# Patient Record
Sex: Female | Born: 1950 | Race: Asian | Hispanic: No | Marital: Married | Smoking: Never smoker
Health system: Southern US, Community
[De-identification: ages and names within clinical notes are randomized; demographics above are authoritative.]

## PROBLEM LIST (undated history)

## (undated) DIAGNOSIS — I10 Essential (primary) hypertension: Secondary | ICD-10-CM

## (undated) DIAGNOSIS — M069 Rheumatoid arthritis, unspecified: Secondary | ICD-10-CM

## (undated) HISTORY — DX: Rheumatoid arthritis, unspecified: M06.9

## (undated) HISTORY — DX: Essential (primary) hypertension: I10

## (undated) HISTORY — PX: KNEE SURGERY: SHX244

---

## 2015-03-22 ENCOUNTER — Encounter: Payer: Self-pay | Admitting: Internal Medicine

## 2015-03-22 ENCOUNTER — Ambulatory Visit (INDEPENDENT_AMBULATORY_CARE_PROVIDER_SITE_OTHER): Payer: Medicare HMO | Admitting: Internal Medicine

## 2015-03-22 VITALS — BP 130/82 | HR 62 | Temp 98.3°F | Resp 12 | Ht 62.0 in | Wt 162.0 lb

## 2015-03-22 DIAGNOSIS — Z23 Encounter for immunization: Secondary | ICD-10-CM | POA: Diagnosis not present

## 2015-03-22 DIAGNOSIS — I10 Essential (primary) hypertension: Secondary | ICD-10-CM

## 2015-03-22 NOTE — Patient Instructions (Signed)
The blood pressure looks really good today.  Keep up the good work with the exercise!  We have given you the pneumonia shot today.  Exercising to Stay Healthy Exercising regularly is important. It has many health benefits, such as:  Improving your overall fitness, flexibility, and endurance.  Increasing your bone density.  Helping with weight control.  Decreasing your body fat.  Increasing your muscle strength.  Reducing stress and tension.  Improving your overall health. In order to become healthy and stay healthy, it is recommended that you do moderate-intensity and vigorous-intensity exercise. You can tell that you are exercising at a moderate intensity if you have a higher heart rate and faster breathing, but you are still able to hold a conversation. You can tell that you are exercising at a vigorous intensity if you are breathing much harder and faster and cannot hold a conversation while exercising. HOW OFTEN SHOULD I EXERCISE? Choose an activity that you enjoy and set realistic goals. Your health care provider can help you to make an activity plan that works for you. Exercise regularly as directed by your health care provider. This may include:   Doing resistance training twice each week, such as:  Push-ups.  Sit-ups.  Lifting weights.  Using resistance bands.  Doing a given intensity of exercise for a given amount of time. Choose from these options:  150 minutes of moderate-intensity exercise every week.  75 minutes of vigorous-intensity exercise every week.  A mix of moderate-intensity and vigorous-intensity exercise every week. Children, pregnant women, people who are out of shape, people who are overweight, and older adults may need to consult a health care provider for individual recommendations. If you have any sort of medical condition, be sure to consult your health care provider before starting a new exercise program.  WHAT ARE SOME EXERCISE IDEAS? Some  moderate-intensity exercise ideas include:   Walking at a rate of 1 mile in 15 minutes.  Biking.  Hiking.  Golfing.  Dancing. Some vigorous-intensity exercise ideas include:   Walking at a rate of at least 4.5 miles per hour.  Jogging or running at a rate of 5 miles per hour.  Biking at a rate of at least 10 miles per hour.  Lap swimming.  Roller-skating or in-line skating.  Cross-country skiing.  Vigorous competitive sports, such as football, basketball, and soccer.  Jumping rope.  Aerobic dancing. WHAT ARE SOME EVERYDAY ACTIVITIES THAT CAN HELP ME TO GET EXERCISE?  Yard work, such as:  Psychologist, educational.  Raking and bagging leaves.  Washing and waxing your car.  Pushing a stroller.  Shoveling snow.  Gardening.  Washing windows or floors. HOW CAN I BE MORE ACTIVE IN MY DAY-TO-DAY ACTIVITIES?  Use the stairs instead of the elevator.  Take a walk during your lunch break.  If you drive, park your car farther away from work or school.  If you take public transportation, get off one stop early and walk the rest of the way.  Make all of your phone calls while standing up and walking around.  Get up, stretch, and walk around every 30 minutes throughout the day. WHAT GUIDELINES SHOULD I FOLLOW WHILE EXERCISING?  Do not exercise so much that you hurt yourself, feel dizzy, or get very short of breath.  Consult your health care provider before starting a new exercise program.  Wear comfortable clothes and shoes with good support.  Drink plenty of water while you exercise to prevent dehydration or heat stroke.  Body water is lost during exercise and must be replaced.  Work out until you breathe faster and your heart beats faster.   This information is not intended to replace advice given to you by your health care provider. Make sure you discuss any questions you have with your health care provider.   Document Released: 03/31/2010 Document Revised:  03/19/2014 Document Reviewed: 07/30/2013 Elsevier Interactive Patient Education [employer].

## 2015-03-22 NOTE — Progress Notes (Signed)
   Subjective:    Patient ID: Raven Haynes, female    DOB: 1950/04/07, 65 y.o.   MRN: [id number]  HPI The patient is a 65 YO female coming in for high blood pressure. She recently moved from [place] and had high pressure there. Here she is exercising and still taking her medicine and it seems to be doing a lot better. Denies headaches, chest pains, SOB. No stomach pain or nausea or vomiting.   PMH, [facility], social history reviewed and updated.   Review of Systems  Constitutional: Negative for fever, activity change, appetite change, fatigue and unexpected weight change.  HENT: Negative.   Eyes: Negative.   Respiratory: Negative for cough, chest tightness, shortness of breath and wheezing.   Cardiovascular: Negative for chest pain, palpitations and leg swelling.  Gastrointestinal: Negative for nausea, abdominal pain, diarrhea, constipation and abdominal distention.  Musculoskeletal: Negative.   Skin: Negative.   Neurological: Negative.   Psychiatric/Behavioral: Negative.       Objective:   Physical Exam  Constitutional: She is oriented to person, place, and time. She appears well-developed and well-nourished.  HENT:  Head: Normocephalic and atraumatic.  Eyes: EOM are normal.  Neck: Normal range of motion.  Cardiovascular: Normal rate and regular rhythm.   Carotids without bruits  Pulmonary/Chest: Effort normal and breath sounds normal. No respiratory distress. She has no wheezes. She has no rales.  Abdominal: Soft. Bowel sounds are normal. She exhibits no distension. There is no tenderness. There is no rebound.  Musculoskeletal: She exhibits no edema.  Neurological: She is alert and oriented to person, place, and time. Coordination normal.  Skin: Skin is warm and dry.  Psychiatric: She has a normal mood and affect.   Filed Vitals:   03/22/15 1114  BP: 130/82  Pulse: 62  Temp: 98.3 F (36.8 C)  TempSrc: Oral  Resp: 12  Height: 5\' 2"  (1.575 m)  Weight: 162 lb (73.483 kg)      Assessment & Plan:  Prevnar 13 given at visit

## 2015-03-22 NOTE — Assessment & Plan Note (Signed)
BP at goal on HCTZ 25 mg daily. Recent BMP checked with her previous physician in [place], will get those records. Not known to be complicated. Check labs at next visit. No side effects from medication. Encouraged continued exercise.

## 2015-03-22 NOTE — Progress Notes (Signed)
Pre visit review using our clinic review tool, if applicable. No additional management support is needed unless otherwise documented below in the visit note. 

## 2015-04-01 ENCOUNTER — Telehealth: Payer: Self-pay | Admitting: Internal Medicine

## 2015-04-01 NOTE — Telephone Encounter (Signed)
Received records from Dr. Melany Guernsey [facility] forwarded 4 pages to Dr. Pricilla Holm 04/01/15 fbg

## 2015-05-02 ENCOUNTER — Other Ambulatory Visit: Payer: Self-pay | Admitting: Geriatric Medicine

## 2015-05-02 MED ORDER — HYDROCHLOROTHIAZIDE 25 MG PO TABS: 25.0000 mg | ORAL_TABLET | Freq: Every day | ORAL | Status: DC

## 2015-09-09 DIAGNOSIS — R2241 Localized swelling, mass and lump, right lower limb: Secondary | ICD-10-CM | POA: Diagnosis not present

## 2015-09-09 DIAGNOSIS — M25571 Pain in right ankle and joints of right foot: Secondary | ICD-10-CM | POA: Diagnosis not present

## 2015-09-09 DIAGNOSIS — M2241 Chondromalacia patellae, right knee: Secondary | ICD-10-CM | POA: Diagnosis not present

## 2015-09-16 DIAGNOSIS — M25571 Pain in right ankle and joints of right foot: Secondary | ICD-10-CM | POA: Diagnosis not present

## 2015-09-21 DIAGNOSIS — M67479 Ganglion, unspecified ankle and foot: Secondary | ICD-10-CM | POA: Diagnosis not present

## 2015-09-21 DIAGNOSIS — R2241 Localized swelling, mass and lump, right lower limb: Secondary | ICD-10-CM | POA: Diagnosis not present

## 2015-09-21 DIAGNOSIS — M25571 Pain in right ankle and joints of right foot: Secondary | ICD-10-CM | POA: Diagnosis not present

## 2015-10-11 DIAGNOSIS — G5751 Tarsal tunnel syndrome, right lower limb: Secondary | ICD-10-CM | POA: Diagnosis not present

## 2015-10-11 DIAGNOSIS — M67471 Ganglion, right ankle and foot: Secondary | ICD-10-CM | POA: Diagnosis not present

## 2015-10-11 DIAGNOSIS — I8311 Varicose veins of right lower extremity with inflammation: Secondary | ICD-10-CM | POA: Diagnosis not present

## 2015-11-15 ENCOUNTER — Other Ambulatory Visit (INDEPENDENT_AMBULATORY_CARE_PROVIDER_SITE_OTHER): Payer: Medicare HMO

## 2015-11-15 ENCOUNTER — Ambulatory Visit (INDEPENDENT_AMBULATORY_CARE_PROVIDER_SITE_OTHER): Payer: Medicare HMO | Admitting: Internal Medicine

## 2015-11-15 ENCOUNTER — Encounter: Payer: Self-pay | Admitting: Internal Medicine

## 2015-11-15 VITALS — BP 142/70 | HR 88 | Temp 98.4°F | Resp 12 | Ht 62.0 in | Wt 162.0 lb

## 2015-11-15 DIAGNOSIS — I10 Essential (primary) hypertension: Secondary | ICD-10-CM

## 2015-11-15 DIAGNOSIS — Z0000 Encounter for general adult medical examination without abnormal findings: Secondary | ICD-10-CM

## 2015-11-15 DIAGNOSIS — Z1159 Encounter for screening for other viral diseases: Secondary | ICD-10-CM

## 2015-11-15 DIAGNOSIS — Z1239 Encounter for other screening for malignant neoplasm of breast: Secondary | ICD-10-CM

## 2015-11-15 DIAGNOSIS — Z23 Encounter for immunization: Secondary | ICD-10-CM

## 2015-11-15 LAB — COMPREHENSIVE METABOLIC PANEL
ALT: 16 U/L (ref 0–35)
AST: 17 U/L (ref 0–37)
Albumin: 4.4 g/dL (ref 3.5–5.2)
Alkaline Phosphatase: 68 U/L (ref 39–117)
BILIRUBIN TOTAL: 0.4 mg/dL (ref 0.2–1.2)
BUN: 16 mg/dL (ref 6–23)
CO2: 29 meq/L (ref 19–32)
CREATININE: 0.85 mg/dL (ref 0.40–1.20)
Calcium: 9.2 mg/dL (ref 8.4–10.5)
Chloride: 102 mEq/L (ref 96–112)
GFR: 71.02 mL/min (ref >60.00)
GLUCOSE: 122 mg/dL — AB (ref 70–99)
Potassium: 3.7 mEq/L (ref 3.5–5.1)
Sodium: 138 mEq/L (ref 135–145)
Total Protein: 7.5 g/dL (ref 6.0–8.3)

## 2015-11-15 LAB — LIPID PANEL
CHOL/HDL RATIO: 4
Cholesterol: 178 mg/dL (ref 0–200)
HDL: 48.1 mg/dL (ref >39.00)
LDL Cholesterol: 106 mg/dL — ABNORMAL HIGH (ref 0–99)
NONHDL: 129.43
Triglycerides: 119 mg/dL (ref 0.0–149.0)
VLDL: 23.8 mg/dL (ref 0.0–40.0)

## 2015-11-15 MED ORDER — HYDROCHLOROTHIAZIDE 25 MG PO TABS: 25.0000 mg | ORAL_TABLET | Freq: Every day | ORAL | 3 refills | Status: DC

## 2015-11-15 NOTE — Assessment & Plan Note (Signed)
Checking labs and hep c screening. Given flu shot today. Counseled about sun safety and mole surveillance. Counseled about exercise and healthy diet. Given 10 year screening recommendations. Ordered mammogram. DEXA not due for another 1-2 years.

## 2015-11-15 NOTE — Progress Notes (Signed)
Pre visit review using our clinic review tool, if applicable. No additional management support is needed unless otherwise documented below in the visit note. 

## 2015-11-15 NOTE — Assessment & Plan Note (Signed)
BP at goal on her HCTZ 25 mg daily. Checking CMP and adjust as needed.

## 2015-11-15 NOTE — Patient Instructions (Addendum)
We have given you the flu shot today.   We have sent in the refills.   The breast center does mammograms: [address], [place], [place] [address], phone number [phone]  Keep up the great work with your health as you are in good shape!  Health Maintenance, Female Adopting a healthy lifestyle and getting preventive care can go a long way to promote health and wellness. Talk with your health care provider about what schedule of regular examinations is right for you. This is a good chance for you to check in with your provider about disease prevention and staying healthy. In between checkups, there are plenty of things you can do on your own. Experts have done a lot of research about which lifestyle changes and preventive measures are most likely to keep you healthy. Ask your health care provider for more information. WEIGHT AND DIET  Eat a healthy diet  Be sure to include plenty of vegetables, fruits, low-fat dairy products, and lean protein.  Do not eat a lot of foods high in solid fats, added sugars, or salt.  Get regular exercise. This is one of the most important things you can do for your health.  Most adults should exercise for at least 150 minutes each week. The exercise should increase your heart rate and make you sweat (moderate-intensity exercise).  Most adults should also do strengthening exercises at least twice a week. This is in addition to the moderate-intensity exercise.  Maintain a healthy weight  Body mass index (BMI) is a measurement that can be used to identify possible weight problems. It estimates body fat based on height and weight. Your health care provider can help determine your BMI and help you achieve or maintain a healthy weight.  For females 52 years of age and older:   A BMI below 18.5 is considered underweight.  A BMI of 18.5 to 24.9 is normal.  A BMI of 25 to 29.9 is considered overweight.  A BMI of 30 and above is considered obese.   Watch levels of cholesterol and blood lipids  You should start having your blood tested for lipids and cholesterol at 66 years of age, then have this test every 5 years.  You may need to have your cholesterol levels checked more often if:  Your lipid or cholesterol levels are high.  You are older than 66 years of age.  You are at high risk for heart disease.  CANCER SCREENING   Lung Cancer  Lung cancer screening is recommended for adults 49-54 years old who are at high risk for lung cancer because of a history of smoking.  A yearly low-dose CT scan of the lungs is recommended for people who:  Currently smoke.  Have quit within the past 15 years.  Have at least a 30-pack-year history of smoking. A pack year is smoking an average of one pack of cigarettes a day for 1 year.  Yearly screening should continue until it has been 15 years since you quit.  Yearly screening should stop if you develop a health problem that would prevent you from having lung cancer treatment.  Breast Cancer  Practice breast self-awareness. This means understanding how your breasts normally appear and feel.  It also means doing regular breast self-exams. Let your health care provider know about any changes, no matter how small.  If you are in your 20s or 30s, you should have a clinical breast exam (CBE) by a health care provider every 1-3  years as part of a regular health exam.  If you are 80 or older, have a CBE every year. Also consider having a breast X-ray (mammogram) every year.  If you have a family history of breast cancer, talk to your health care provider about genetic screening.  If you are at high risk for breast cancer, talk to your health care provider about having an MRI and a mammogram every year.  Breast cancer gene (BRCA) assessment is recommended for women who have family members with BRCA-related cancers. BRCA-related cancers  include:  Breast.  Ovarian.  Tubal.  Peritoneal cancers.  Results of the assessment will determine the need for genetic counseling and BRCA1 and BRCA2 testing. Cervical Cancer Your health care provider may recommend that you be screened regularly for cancer of the pelvic organs (ovaries, uterus, and vagina). This screening involves a pelvic examination, including checking for microscopic changes to the surface of your cervix (Pap test). You may be encouraged to have this screening done every 3 years, beginning at age 21.  For women ages 22-65, health care providers may recommend pelvic exams and Pap testing every 3 years, or they may recommend the Pap and pelvic exam, combined with testing for human papilloma virus (HPV), every 5 years. Some types of HPV increase your risk of cervical cancer. Testing for HPV may also be done on women of any age with unclear Pap test results.  Other health care providers may not recommend any screening for nonpregnant women who are considered low risk for pelvic cancer and who do not have symptoms. Ask your health care provider if a screening pelvic exam is right for you.  If you have had past treatment for cervical cancer or a condition that could lead to cancer, you need Pap tests and screening for cancer for at least 20 years after your treatment. If Pap tests have been discontinued, your risk factors (such as having a new sexual partner) need to be reassessed to determine if screening should resume. Some women have medical problems that increase the chance of getting cervical cancer. In these cases, your health care provider may recommend more frequent screening and Pap tests. Colorectal Cancer  This type of cancer can be detected and often prevented.  Routine colorectal cancer screening usually begins at 66 years of age and continues through 66 years of age.  Your health care provider may recommend screening at an earlier age if you have risk factors for  colon cancer.  Your health care provider may also recommend using home test kits to check for hidden blood in the stool.  A small camera at the end of a tube can be used to examine your colon directly (sigmoidoscopy or colonoscopy). This is done to check for the earliest forms of colorectal cancer.  Routine screening usually begins at age 7.  Direct examination of the colon should be repeated every 5-10 years through 66 years of age. However, you may need to be screened more often if early forms of precancerous polyps or small growths are found. Skin Cancer  Check your skin from head to toe regularly.  Tell your health care provider about any new moles or changes in moles, especially if there is a change in a mole's shape or color.  Also tell your health care provider if you have a mole that is larger than the size of a pencil eraser.  Always use sunscreen. Apply sunscreen liberally and repeatedly throughout the day.  Protect yourself by wearing long  sleeves, pants, a wide-brimmed hat, and sunglasses whenever you are outside. HEART DISEASE, DIABETES, AND HIGH BLOOD PRESSURE   High blood pressure causes heart disease and increases the risk of stroke. High blood pressure is more likely to develop in:  People who have blood pressure in the high end of the normal range (130-139/85-89 mm Hg).  People who are overweight or obese.  People who are African American.  If you are 61-23 years of age, have your blood pressure checked every 3-5 years. If you are 72 years of age or older, have your blood pressure checked every year. You should have your blood pressure measured twice--once when you are at a hospital or clinic, and once when you are not at a hospital or clinic. Record the average of the two measurements. To check your blood pressure when you are not at a hospital or clinic, you can use:  An automated blood pressure machine at a pharmacy.  A home blood pressure monitor.  If you  are between 59 years and 56 years old, ask your health care provider if you should take aspirin to prevent strokes.  Have regular diabetes screenings. This involves taking a blood sample to check your fasting blood sugar level.  If you are at a normal weight and have a low risk for diabetes, have this test once every three years after 66 years of age.  If you are overweight and have a high risk for diabetes, consider being tested at a younger age or more often. PREVENTING INFECTION  Hepatitis B  If you have a higher risk for hepatitis B, you should be screened for this virus. You are considered at high risk for hepatitis B if:  You were born in a country where hepatitis B is common. Ask your health care provider which countries are considered high risk.  Your parents were born in a high-risk country, and you have not been immunized against hepatitis B (hepatitis B vaccine).  You have HIV or AIDS.  You use needles to inject street drugs.  You live with someone who has hepatitis B.  You have had sex with someone who has hepatitis B.  You get hemodialysis treatment.  You take certain medicines for conditions, including cancer, organ transplantation, and autoimmune conditions. Hepatitis C  Blood testing is recommended for:  Everyone born from [birth year] through 1966.  Anyone with known risk factors for hepatitis C. Sexually transmitted infections (STIs)  You should be screened for sexually transmitted infections (STIs) including gonorrhea and chlamydia if:  You are sexually active and are younger than 66 years of age.  You are older than 66 years of age and your health care provider tells you that you are at risk for this type of infection.  Your sexual activity has changed since you were last screened and you are at an increased risk for chlamydia or gonorrhea. Ask your health care provider if you are at risk.  If you do not have HIV, but are at risk, it may be recommended that you  take a prescription medicine daily to prevent HIV infection. This is called pre-exposure prophylaxis (PrEP). You are considered at risk if:  You are sexually active and do not regularly use condoms or know the HIV status of your partner(s).  You take drugs by injection.  You are sexually active with a partner who has HIV. Talk with your health care provider about whether you are at high risk of being infected with HIV. If you choose  to begin PrEP, you should first be tested for HIV. You should then be tested every 3 months for as long as you are taking PrEP.  PREGNANCY   If you are premenopausal and you may become pregnant, ask your health care provider about preconception counseling.  If you may become pregnant, take 400 to 800 micrograms (mcg) of folic acid every day.  If you want to prevent pregnancy, talk to your health care provider about birth control (contraception). OSTEOPOROSIS AND MENOPAUSE   Osteoporosis is a disease in which the bones lose minerals and strength with aging. This can result in serious bone fractures. Your risk for osteoporosis can be identified using a bone density scan.  If you are 23 years of age or older, or if you are at risk for osteoporosis and fractures, ask your health care provider if you should be screened.  Ask your health care provider whether you should take a calcium or vitamin D supplement to lower your risk for osteoporosis.  Menopause may have certain physical symptoms and risks.  Hormone replacement therapy may reduce some of these symptoms and risks. Talk to your health care provider about whether hormone replacement therapy is right for you.  HOME CARE INSTRUCTIONS   Schedule regular health, dental, and eye exams.  Stay current with your immunizations.   Do not use any tobacco products including cigarettes, chewing tobacco, or electronic cigarettes.  If you are pregnant, do not drink alcohol.  If you are breastfeeding, limit how  much and how often you drink alcohol.  Limit alcohol intake to no more than 1 drink per day for nonpregnant women. One drink equals 12 ounces of beer, 5 ounces of wine, or 1 ounces of hard liquor.  Do not use street drugs.  Do not share needles.  Ask your health care provider for help if you need support or information about quitting drugs.  Tell your health care provider if you often feel depressed.  Tell your health care provider if you have ever been abused or do not feel safe at home.   This information is not intended to replace advice given to you by your health care provider. Make sure you discuss any questions you have with your health care provider.   Document Released: 09/11/2010 Document Revised: 03/19/2014 Document Reviewed: 01/28/2013 Elsevier Interactive Patient Education [employer].

## 2015-11-15 NOTE — Progress Notes (Signed)
   Subjective:    Patient ID: Raven Haynes, female    DOB: [birth year], 65 y.o.   MRN: [id number]  HPI Here for medicare wellness and CPE, no new complaints. Please see A/P for status and treatment of chronic medical problems.   Diet: heart healthy Physical activity: active Depression/mood screen: negative Hearing: intact to whispered voice Visual acuity: grossly normal with contacts, performs annual eye exam  ADLs: capable Fall risk: none Home safety: good Cognitive evaluation: intact to orientation, naming, recall and repetition EOL planning: adv directives discussed  I have personally reviewed and have noted 1. The patient's medical and social history - reviewed today no changes 2. Their use of alcohol, tobacco or illicit drugs 3. Their current medications and supplements 4. The patient's functional ability including ADL's, fall risks, home safety risks and hearing or visual impairment. 5. Diet and physical activities 6. Evidence for depression or mood disorders 7. Care team reviewed and updated (available in snapshot)  Review of Systems  Constitutional: Negative for activity change, appetite change, fatigue, fever and unexpected weight change.  HENT: Negative.   Eyes: Negative.   Respiratory: Negative for cough, chest tightness, shortness of breath and wheezing.   Cardiovascular: Negative for chest pain, palpitations and leg swelling.  Gastrointestinal: Negative for abdominal distention, abdominal pain, constipation, diarrhea and nausea.  Musculoskeletal: Negative.   Skin: Negative.   Neurological: Negative.   Psychiatric/Behavioral: Negative.       Objective:   Physical Exam  Constitutional: She is oriented to person, place, and time. She appears well-developed and well-nourished.  HENT:  Head: Normocephalic and atraumatic.  Eyes: EOM are normal.  Neck: Normal range of motion.  Cardiovascular: Normal rate and regular rhythm.   Carotids without bruits    Pulmonary/Chest: Effort normal and breath sounds normal. No respiratory distress. She has no wheezes. She has no rales.  Abdominal: Soft. Bowel sounds are normal. She exhibits no distension. There is no tenderness. There is no rebound.  Musculoskeletal: She exhibits no edema.  Neurological: She is alert and oriented to person, place, and time. Coordination normal.  Skin: Skin is warm and dry.  Psychiatric: She has a normal mood and affect.   Vitals:   11/15/15 0859  BP: (!) 142/70  Pulse: 88  Resp: 12  Temp: 98.4 F (36.9 C)  TempSrc: Oral  SpO2: 97%  Weight: 162 lb (73.5 kg)  Height: 5\' 2"  (1.575 m)      Assessment & Plan:  Flu shot given at visit.

## 2015-11-16 LAB — HEPATITIS C ANTIBODY: HCV Ab: NEGATIVE

## 2015-12-21 ENCOUNTER — Ambulatory Visit
Admission: RE | Admit: 2015-12-21 | Discharge: 2015-12-21 | Disposition: A | Discharge status: Home / Self Care | Payer: Medicare HMO | Source: Ambulatory Visit | Attending: Internal Medicine | Admitting: Internal Medicine

## 2015-12-21 DIAGNOSIS — Z1231 Encounter for screening mammogram for malignant neoplasm of breast: Secondary | ICD-10-CM | POA: Diagnosis not present

## 2015-12-21 DIAGNOSIS — Z1239 Encounter for other screening for malignant neoplasm of breast: Secondary | ICD-10-CM

## 2016-11-15 ENCOUNTER — Encounter: Payer: Medicare HMO | Admitting: Internal Medicine

## 2016-11-19 ENCOUNTER — Telehealth: Payer: Self-pay | Admitting: Internal Medicine

## 2016-11-19 MED ORDER — HYDROCHLOROTHIAZIDE 25 MG PO TABS: 25.0000 mg | ORAL_TABLET | Freq: Every day | ORAL | 0 refills | Status: DC

## 2016-11-19 NOTE — Telephone Encounter (Signed)
Pt called requesting a refill on her hydrochlorothiazide (HYDRODIURIL) 25 MG tablet sent to [facility] in [place]. She said that her appointment was originally scheduled in September but was moved due to Dr Sharlet Salina being out of the office.

## 2016-11-19 NOTE — Telephone Encounter (Signed)
Per office policy sent 30 day to local pharmacy until appt.../lmb  

## 2016-12-10 ENCOUNTER — Ambulatory Visit (INDEPENDENT_AMBULATORY_CARE_PROVIDER_SITE_OTHER): Payer: Medicare HMO | Admitting: Internal Medicine

## 2016-12-10 ENCOUNTER — Encounter: Payer: Self-pay | Admitting: Internal Medicine

## 2016-12-10 ENCOUNTER — Other Ambulatory Visit (INDEPENDENT_AMBULATORY_CARE_PROVIDER_SITE_OTHER): Payer: Medicare HMO

## 2016-12-10 VITALS — BP 130/82 | HR 56 | Temp 97.8°F | Ht 62.0 in | Wt 160.0 lb

## 2016-12-10 DIAGNOSIS — Z23 Encounter for immunization: Secondary | ICD-10-CM

## 2016-12-10 DIAGNOSIS — I10 Essential (primary) hypertension: Secondary | ICD-10-CM | POA: Diagnosis not present

## 2016-12-10 DIAGNOSIS — Z0000 Encounter for general adult medical examination without abnormal findings: Secondary | ICD-10-CM

## 2016-12-10 LAB — COMPREHENSIVE METABOLIC PANEL
ALBUMIN: 4.3 g/dL (ref 3.5–5.2)
ALK PHOS: 66 U/L (ref 39–117)
ALT: 18 U/L (ref 0–35)
AST: 18 U/L (ref 0–37)
BUN: 15 mg/dL (ref 6–23)
CO2: 27 mEq/L (ref 19–32)
Calcium: 9.6 mg/dL (ref 8.4–10.5)
Chloride: 103 mEq/L (ref 96–112)
Creatinine, Ser: 0.66 mg/dL (ref 0.40–1.20)
GFR: 94.79 mL/min (ref >60.00)
Glucose, Bld: 125 mg/dL — ABNORMAL HIGH (ref 70–99)
POTASSIUM: 4.1 meq/L (ref 3.5–5.1)
Sodium: 140 mEq/L (ref 135–145)
TOTAL PROTEIN: 7.3 g/dL (ref 6.0–8.3)
Total Bilirubin: 0.5 mg/dL (ref 0.2–1.2)

## 2016-12-10 LAB — CBC
HCT: 40.9 % (ref 36.0–46.0)
HEMOGLOBIN: 13.6 g/dL (ref 12.0–15.0)
MCHC: 33.2 g/dL (ref 30.0–36.0)
MCV: 88.5 fl (ref 78.0–100.0)
PLATELETS: 276 10*3/uL (ref 150.0–400.0)
RBC: 4.62 Mil/uL (ref 3.87–5.11)
RDW: 13 % (ref 11.5–15.5)
WBC: 3.6 10*3/uL — AB (ref 4.0–10.5)

## 2016-12-10 LAB — LIPID PANEL
CHOLESTEROL: 192 mg/dL (ref 0–200)
HDL: 50.4 mg/dL (ref >39.00)
LDL CALC: 116 mg/dL — AB (ref 0–99)
NonHDL: 142.08
Total CHOL/HDL Ratio: 4
Triglycerides: 129 mg/dL (ref 0.0–149.0)
VLDL: 25.8 mg/dL (ref 0.0–40.0)

## 2016-12-10 LAB — HEMOGLOBIN A1C: HEMOGLOBIN A1C: 6.2 % (ref 4.6–6.5)

## 2016-12-10 MED ORDER — ZOSTER VAC RECOMB ADJUVANTED 50 MCG/0.5ML IM SUSR: 0.5000 mL | Freq: Once | INTRAMUSCULAR | 1 refills | Status: AC

## 2016-12-10 NOTE — Progress Notes (Signed)
   Subjective:    Patient ID: Raven Haynes, female    DOB: 01-Mar-1951, 66 y.o.   MRN: [id number]  HPI Here for medicare wellness and physical, no new complaints. Please see A/P for status and treatment of chronic medical problems.   Diet: heart healthy Physical activity: sedentary Depression/mood screen: negative Hearing: intact to whispered voice Visual acuity: grossly normal with contacts, performs annual eye exam  ADLs: capable Fall risk: none Home safety: good Cognitive evaluation: intact to orientation, naming, recall and repetition EOL planning: adv directives discussed  I have personally reviewed and have noted 1. The patient's medical and social history - reviewed today no changes 2. Their use of alcohol, tobacco or illicit drugs 3. Their current medications and supplements 4. The patient's functional ability including ADL's, fall risks, home safety risks and hearing or visual impairment. 5. Diet and physical activities 6. Evidence for depression or mood disorders 7. Care team reviewed and updated (available in snapshot)  Review of Systems  Constitutional: Negative.   HENT: Negative.   Eyes: Negative.   Respiratory: Negative for cough, chest tightness and shortness of breath.   Cardiovascular: Negative for chest pain, palpitations and leg swelling.  Gastrointestinal: Negative for abdominal distention, abdominal pain, constipation, diarrhea, nausea and vomiting.  Musculoskeletal: Negative.   Skin: Negative.   Neurological: Negative.   Psychiatric/Behavioral: Negative.       Objective:   Physical Exam  Constitutional: She is oriented to person, place, and time. She appears well-developed and well-nourished.  HENT:  Head: Normocephalic and atraumatic.  Eyes: EOM are normal.  Neck: Normal range of motion.  Cardiovascular: Normal rate and regular rhythm.   Carotids without bruit  Pulmonary/Chest: Effort normal and breath sounds normal. No respiratory distress. She  has no wheezes. She has no rales.  Abdominal: Soft. Bowel sounds are normal. She exhibits no distension. There is no tenderness. There is no rebound.  Musculoskeletal: She exhibits no edema.  Neurological: She is alert and oriented to person, place, and time. Coordination normal.  Skin: Skin is warm and dry.  Psychiatric: She has a normal mood and affect.   Vitals:   12/10/16 0800  BP: 130/82  Pulse: (!) 56  Temp: 97.8 F (36.6 C)  TempSrc: Oral  SpO2: 99%  Weight: 160 lb (72.6 kg)  Height: 5\' 2"  (1.575 m)      Assessment & Plan:  Flu shot given at visit

## 2016-12-10 NOTE — Patient Instructions (Addendum)
We will check the labs today and call you back with the results.   We have given you the prescription for the shingles vaccine to get at a pharmacy.   Health Maintenance, Female Adopting a healthy lifestyle and getting preventive care can go a long way to promote health and wellness. Talk with your health care provider about what schedule of regular examinations is right for you. This is a good chance for you to check in with your provider about disease prevention and staying healthy. In between checkups, there are plenty of things you can do on your own. Experts have done a lot of research about which lifestyle changes and preventive measures are most likely to keep you healthy. Ask your health care provider for more information. Weight and diet Eat a healthy diet  Be sure to include plenty of vegetables, fruits, low-fat dairy products, and lean protein.  Do not eat a lot of foods high in solid fats, added sugars, or salt.  Get regular exercise. This is one of the most important things you can do for your health. ? Most adults should exercise for at least 150 minutes each week. The exercise should increase your heart rate and make you sweat (moderate-intensity exercise). ? Most adults should also do strengthening exercises at least twice a week. This is in addition to the moderate-intensity exercise.  Maintain a healthy weight  Body mass index (BMI) is a measurement that can be used to identify possible weight problems. It estimates body fat based on height and weight. Your health care provider can help determine your BMI and help you achieve or maintain a healthy weight.  For females 21 years of age and older: ? A BMI below 18.5 is considered underweight. ? A BMI of 18.5 to 24.9 is normal. ? A BMI of 25 to 29.9 is considered overweight. ? A BMI of 30 and above is considered obese.  Watch levels of cholesterol and blood lipids  You should start having your blood tested for lipids and  cholesterol at 67 years of age, then have this test every 5 years.  You may need to have your cholesterol levels checked more often if: ? Your lipid or cholesterol levels are high. ? You are older than 67 years of age. ? You are at high risk for heart disease.  Cancer screening Lung Cancer  Lung cancer screening is recommended for adults 60-1 years old who are at high risk for lung cancer because of a history of smoking.  A yearly low-dose CT scan of the lungs is recommended for people who: ? Currently smoke. ? Have quit within the past 15 years. ? Have at least a 30-pack-year history of smoking. A pack year is smoking an average of one pack of cigarettes a day for 1 year.  Yearly screening should continue until it has been 15 years since you quit.  Yearly screening should stop if you develop a health problem that would prevent you from having lung cancer treatment.  Breast Cancer  Practice breast self-awareness. This means understanding how your breasts normally appear and feel.  It also means doing regular breast self-exams. Let your health care provider know about any changes, no matter how small.  If you are in your 20s or 30s, you should have a clinical breast exam (CBE) by a health care provider every 1-3 years as part of a regular health exam.  If you are 84 or older, have a CBE every year. Also consider having  a breast X-ray (mammogram) every year.  If you have a family history of breast cancer, talk to your health care provider about genetic screening.  If you are at high risk for breast cancer, talk to your health care provider about having an MRI and a mammogram every year.  Breast cancer gene (BRCA) assessment is recommended for women who have family members with BRCA-related cancers. BRCA-related cancers include: ? Breast. ? Ovarian. ? Tubal. ? Peritoneal cancers.  Results of the assessment will determine the need for genetic counseling and BRCA1 and BRCA2  testing.  Cervical Cancer Your health care provider may recommend that you be screened regularly for cancer of the pelvic organs (ovaries, uterus, and vagina). This screening involves a pelvic examination, including checking for microscopic changes to the surface of your cervix (Pap test). You may be encouraged to have this screening done every 3 years, beginning at age 64.  For women ages 77-65, health care providers may recommend pelvic exams and Pap testing every 3 years, or they may recommend the Pap and pelvic exam, combined with testing for human papilloma virus (HPV), every 5 years. Some types of HPV increase your risk of cervical cancer. Testing for HPV may also be done on women of any age with unclear Pap test results.  Other health care providers may not recommend any screening for nonpregnant women who are considered low risk for pelvic cancer and who do not have symptoms. Ask your health care provider if a screening pelvic exam is right for you.  If you have had past treatment for cervical cancer or a condition that could lead to cancer, you need Pap tests and screening for cancer for at least 20 years after your treatment. If Pap tests have been discontinued, your risk factors (such as having a new sexual partner) need to be reassessed to determine if screening should resume. Some women have medical problems that increase the chance of getting cervical cancer. In these cases, your health care provider may recommend more frequent screening and Pap tests.  Colorectal Cancer  This type of cancer can be detected and often prevented.  Routine colorectal cancer screening usually begins at 67 years of age and continues through 67 years of age.  Your health care provider may recommend screening at an earlier age if you have risk factors for colon cancer.  Your health care provider may also recommend using home test kits to check for hidden blood in the stool.  A small camera at the end of a  tube can be used to examine your colon directly (sigmoidoscopy or colonoscopy). This is done to check for the earliest forms of colorectal cancer.  Routine screening usually begins at age 75.  Direct examination of the colon should be repeated every 5-10 years through 67 years of age. However, you may need to be screened more often if early forms of precancerous polyps or small growths are found.  Skin Cancer  Check your skin from head to toe regularly.  Tell your health care provider about any new moles or changes in moles, especially if there is a change in a mole's shape or color.  Also tell your health care provider if you have a mole that is larger than the size of a pencil eraser.  Always use sunscreen. Apply sunscreen liberally and repeatedly throughout the day.  Protect yourself by wearing long sleeves, pants, a wide-brimmed hat, and sunglasses whenever you are outside.  Heart disease, diabetes, and high blood pressure  High blood pressure causes heart disease and increases the risk of stroke. High blood pressure is more likely to develop in: ? People who have blood pressure in the high end of the normal range (130-139/85-89 mm Hg). ? People who are overweight or obese. ? People who are African American.  If you are 97-43 years of age, have your blood pressure checked every 3-5 years. If you are 34 years of age or older, have your blood pressure checked every year. You should have your blood pressure measured twice-once when you are at a hospital or clinic, and once when you are not at a hospital or clinic. Record the average of the two measurements. To check your blood pressure when you are not at a hospital or clinic, you can use: ? An automated blood pressure machine at a pharmacy. ? A home blood pressure monitor.  If you are between 39 years and 85 years old, ask your health care provider if you should take aspirin to prevent strokes.  Have regular diabetes screenings. This  involves taking a blood sample to check your fasting blood sugar level. ? If you are at a normal weight and have a low risk for diabetes, have this test once every three years after 67 years of age. ? If you are overweight and have a high risk for diabetes, consider being tested at a younger age or more often. Preventing infection Hepatitis B  If you have a higher risk for hepatitis B, you should be screened for this virus. You are considered at high risk for hepatitis B if: ? You were born in a country where hepatitis B is common. Ask your health care provider which countries are considered high risk. ? Your parents were born in a high-risk country, and you have not been immunized against hepatitis B (hepatitis B vaccine). ? You have HIV or AIDS. ? You use needles to inject street drugs. ? You live with someone who has hepatitis B. ? You have had sex with someone who has hepatitis B. ? You get hemodialysis treatment. ? You take certain medicines for conditions, including cancer, organ transplantation, and autoimmune conditions.  Hepatitis C  Blood testing is recommended for: ? Everyone born from [birth year] through 1966. ? Anyone with known risk factors for hepatitis C.  Sexually transmitted infections (STIs)  You should be screened for sexually transmitted infections (STIs) including gonorrhea and chlamydia if: ? You are sexually active and are younger than 67 years of age. ? You are older than 67 years of age and your health care provider tells you that you are at risk for this type of infection. ? Your sexual activity has changed since you were last screened and you are at an increased risk for chlamydia or gonorrhea. Ask your health care provider if you are at risk.  If you do not have HIV, but are at risk, it may be recommended that you take a prescription medicine daily to prevent HIV infection. This is called pre-exposure prophylaxis (PrEP). You are considered at risk if: ? You are  sexually active and do not regularly use condoms or know the HIV status of your partner(s). ? You take drugs by injection. ? You are sexually active with a partner who has HIV.  Talk with your health care provider about whether you are at high risk of being infected with HIV. If you choose to begin PrEP, you should first be tested for HIV. You should then be tested every 3 months  for as long as you are taking PrEP. Pregnancy  If you are premenopausal and you may become pregnant, ask your health care provider about preconception counseling.  If you may become pregnant, take 400 to 800 micrograms (mcg) of folic acid every day.  If you want to prevent pregnancy, talk to your health care provider about birth control (contraception). Osteoporosis and menopause  Osteoporosis is a disease in which the bones lose minerals and strength with aging. This can result in serious bone fractures. Your risk for osteoporosis can be identified using a bone density scan.  If you are 29 years of age or older, or if you are at risk for osteoporosis and fractures, ask your health care provider if you should be screened.  Ask your health care provider whether you should take a calcium or vitamin D supplement to lower your risk for osteoporosis.  Menopause may have certain physical symptoms and risks.  Hormone replacement therapy may reduce some of these symptoms and risks. Talk to your health care provider about whether hormone replacement therapy is right for you. Follow these instructions at home:  Schedule regular health, dental, and eye exams.  Stay current with your immunizations.  Do not use any tobacco products including cigarettes, chewing tobacco, or electronic cigarettes.  If you are pregnant, do not drink alcohol.  If you are breastfeeding, limit how much and how often you drink alcohol.  Limit alcohol intake to no more than 1 drink per day for nonpregnant women. One drink equals 12 ounces of  beer, 5 ounces of wine, or 1 ounces of hard liquor.  Do not use street drugs.  Do not share needles.  Ask your health care provider for help if you need support or information about quitting drugs.  Tell your health care provider if you often feel depressed.  Tell your health care provider if you have ever been abused or do not feel safe at home. This information is not intended to replace advice given to you by your health care provider. Make sure you discuss any questions you have with your health care provider. Document Released: 09/11/2010 Document Revised: 08/04/2015 Document Reviewed: 11/30/2014 Elsevier Interactive Patient Education  [employer].

## 2016-12-10 NOTE — Assessment & Plan Note (Signed)
Flu shot given at visit, checking labs, rx for shingrix given at visit. Tdap and pneumonia up to date. Dexa up to date. Mammogram up to date. Colonoscopy up to date. Counseled about sun safety and mole surveillance. Counseled about need for regular exercise. Given 10 year screening recommendations.

## 2016-12-10 NOTE — Assessment & Plan Note (Signed)
Taking hctz 25 mg daily and BP at goal. Counseled on need for regular exercise. Checking CMP and adjust as needed.

## 2016-12-26 ENCOUNTER — Other Ambulatory Visit: Payer: Self-pay | Admitting: Internal Medicine

## 2016-12-28 ENCOUNTER — Telehealth: Payer: Self-pay | Admitting: Internal Medicine

## 2016-12-28 NOTE — Telephone Encounter (Signed)
Patient is requesting refills on hydrochlorothiazide.  Patient uses [facility].

## 2016-12-28 NOTE — Telephone Encounter (Signed)
Patient informed Rx was sent and is ready at her pharmacy

## 2017-07-24 DIAGNOSIS — Z833 Family history of diabetes mellitus: Secondary | ICD-10-CM | POA: Diagnosis not present

## 2017-07-24 DIAGNOSIS — I10 Essential (primary) hypertension: Secondary | ICD-10-CM | POA: Diagnosis not present

## 2017-10-18 DIAGNOSIS — H40013 Open angle with borderline findings, low risk, bilateral: Secondary | ICD-10-CM | POA: Diagnosis not present

## 2017-10-18 DIAGNOSIS — H43813 Vitreous degeneration, bilateral: Secondary | ICD-10-CM | POA: Diagnosis not present

## 2017-10-18 DIAGNOSIS — H2513 Age-related nuclear cataract, bilateral: Secondary | ICD-10-CM | POA: Diagnosis not present

## 2017-12-12 ENCOUNTER — Telehealth: Payer: Self-pay | Admitting: *Deleted

## 2017-12-12 NOTE — Progress Notes (Signed)
Subjective:   Raven Haynes is a 67 y.o. female who presents for Medicare Annual (Subsequent) preventive examination.  Review of Systems:  No ROS.  Medicare Wellness Visit. Additional risk factors are reflected in the social history.  Cardiac Risk Factors include: advanced age (>34men, >56 women);hypertension Sleep patterns: feels rested on waking, gets up 1 times nightly to void and sleeps 7-8 hours nightly.    Home Safety/Smoke Alarms: Feels safe in home. Smoke alarms in place.  Living environment; residence and Firearm Safety: 2-story house, no firearms. Lives with husband, no needs for DME, good support system Seat Belt Safety/Bike Helmet: Wears seat belt.      Objective:     Vitals: BP (!) 150/90   Pulse 71   Ht 5\' 2"  (1.575 m)   Wt 164 lb (74.4 kg)   SpO2 97%   BMI 30.00 kg/m   Body mass index is 30 kg/m.  Advanced Directives 12/13/2017  Does Patient Have a Medical Advance Directive? Yes  Type of Paramedic of [place];Living will  Copy of [place] in Chart? No - copy requested    Tobacco Social History   Tobacco Use  Smoking Status Never Smoker  Smokeless Tobacco Never Used     Counseling given: Not Answered  Past Medical History:  Diagnosis Date  . Hypertension    Past Surgical History:  Procedure Laterality Date  . KNEE SURGERY Bilateral 2004,2009   Family History  Problem Relation Age of Onset  . Diabetes Mother    Social History   Socioeconomic History  . Marital status: Married    Spouse name: Not on file  . Number of children: 2  . Years of education: Not on file  . Highest education level: Not on file  Occupational History  . Occupation: owns Radio broadcast assistant: [employer]  Social Needs  . Financial resource strain: Not hard at all  . Food insecurity:    Worry: Never true    Inability: Never true  . Transportation needs:    Medical: No    Non-medical: No    Tobacco Use  . Smoking status: Never Smoker  . Smokeless tobacco: Never Used  Substance and Sexual Activity  . Alcohol use: No    Alcohol/week: 0.0 standard drinks  . Drug use: No  . Sexual activity: Yes  Lifestyle  . Physical activity:    Days per week: 4 days    Minutes per session: 60 min  . Stress: Only a little  Relationships  . Social connections:    Talks on phone: More than three times a week    Gets together: More than three times a week    Attends religious service: More than 4 times per year    Active member of club or organization: Yes    Attends meetings of clubs or organizations: More than 4 times per year    Relationship status: Married  Other Topics Concern  . Not on file  Social History Narrative  . Not on file    Outpatient Encounter Medications as of 12/13/2017  Medication Sig  . B Complex-C (SUPER B COMPLEX PO) Take by mouth.  . Calcium Citrate-Vitamin D (CITRACAL + D PO) Take by mouth.  . Cholecalciferol (D3-1000 PO) Take by mouth.  . Cyanocobalamin (VITAMIN B 12 PO) Take by mouth.  . hydrochlorothiazide (HYDRODIURIL) 25 MG tablet Take 1 tablet (25 mg total) by mouth daily.  . Magnesium 400  MG CAPS Take by mouth.  . Red Yeast Rice Extract (RED YEAST RICE PO) Take by mouth.  . [DISCONTINUED] hydrochlorothiazide (HYDRODIURIL) 25 MG tablet TAKE ONE TABLET BY MOUTH ONE TIME DAILY    No facility-administered encounter medications on file as of 12/13/2017.     Activities of Daily Living In your present state of health, do you have any difficulty performing the following activities: 12/13/2017  Hearing? N  Vision? N  Difficulty concentrating or making decisions? N  Walking or climbing stairs? N  Dressing or bathing? N  Doing errands, shopping? N  Preparing Food and eating ? N  Using the Toilet? N  In the past six months, have you accidently leaked urine? N  Do you have problems with loss of bowel control? N  Managing your Medications? N  Managing  your Finances? N  Housekeeping or managing your Housekeeping? N  Some recent data might be hidden    Patient Care Team: Hoyt Koch, MD as PCP - General (Internal Medicine)    Assessment:   This is a routine wellness examination for Raven Haynes. Physical assessment deferred to PCP.   Exercise Activities and Dietary recommendations Current Exercise Habits: Home exercise routine, Type of exercise: stretching;calisthenics;strength training/weights, Time (Minutes): 60, Frequency (Times/Week): 4, Weekly Exercise (Minutes/Week): 240, Intensity: Mild, Exercise limited by: None identified  Diet (meal preparation, eat out, water intake, caffeinated beverages, dairy products, fruits and vegetables): in general, a "healthy" diet  , well balanced. eats a variety of fruits and vegetables daily, limits salt, fat/cholesterol, sugar,carbohydrates,caffeine, drinks 6-8 glasses of water daily.  Goals   None     Fall Risk Fall Risk  12/13/2017 12/10/2016 03/22/2015  Falls in the past year? No No No    Depression Screen PHQ 2/9 Scores 12/13/2017 12/13/2017 12/10/2016 03/22/2015  PHQ - 2 Score 0 0 0 0     Cognitive Function       Ad8 score reviewed for issues:  Issues making decisions: no  Less interest in hobbies / activities: no  Repeats questions, stories (family complaining): no  Trouble using ordinary gadgets (microwave, computer, phone):no  Forgets the month or year: no  Mismanaging finances: no  Remembering appts: no  Daily problems with thinking and/or memory: no Ad8 score is= 0  Immunization History  Administered Date(s) Administered  . Influenza, High Dose Seasonal PF 12/10/2016, 12/13/2017  . Influenza,inj,Quad PF,6+ Mos 11/15/2015  . Pneumococcal Conjugate-13 03/22/2015  . Pneumococcal Polysaccharide-23 03/10/2014  . Tdap 09/09/2011  . Zoster 12/24/2013  . Zoster Recombinat (Shingrix) 10/02/2017   Screening Tests Health Maintenance  Topic Date Due  . MAMMOGRAM   12/20/2017  . PNA vac Low Risk Adult (2 of 2 - PPSV23) 03/11/2019  . TETANUS/TDAP  09/08/2021  . COLONOSCOPY  07/10/2023  . INFLUENZA VACCINE  Completed  . Hepatitis C Screening  Completed  . DEXA SCAN  Addressed      Plan:      Continue doing brain stimulating activities (puzzles, reading, adult coloring books, staying active) to keep memory sharp.   Continue to eat heart healthy diet (full of fruits, vegetables, whole grains, lean protein, water--limit salt, fat, and sugar intake) and increase physical activity as tolerated.  I have personally reviewed and noted the following in the patient's chart:   . Medical and social history . Use of alcohol, tobacco or illicit drugs  . Current medications and supplements . Functional ability and status . Nutritional status . Physical activity . Advanced directives . List of  other physicians . Vitals . Screenings to include cognitive, depression, and falls . Referrals and appointments  In addition, I have reviewed and discussed with patient certain preventive protocols, quality metrics, and best practice recommendations. A written personalized care plan for preventive services as well as general preventive health recommendations were provided to patient.     Michiel Cowboy, RN  12/13/2017

## 2017-12-12 NOTE — Telephone Encounter (Signed)
LVM for patient to call back in regards to scheduling AWV with our health coach.  

## 2017-12-13 ENCOUNTER — Encounter: Payer: Self-pay | Admitting: Internal Medicine

## 2017-12-13 ENCOUNTER — Ambulatory Visit (INDEPENDENT_AMBULATORY_CARE_PROVIDER_SITE_OTHER): Payer: Medicare HMO | Admitting: Internal Medicine

## 2017-12-13 ENCOUNTER — Ambulatory Visit (INDEPENDENT_AMBULATORY_CARE_PROVIDER_SITE_OTHER): Payer: Medicare HMO | Admitting: *Deleted

## 2017-12-13 ENCOUNTER — Other Ambulatory Visit (INDEPENDENT_AMBULATORY_CARE_PROVIDER_SITE_OTHER): Payer: Medicare HMO

## 2017-12-13 VITALS — BP 150/90 | HR 71 | Ht 62.0 in | Wt 164.0 lb

## 2017-12-13 VITALS — BP 140/88 | HR 71 | Temp 98.0°F | Ht 62.0 in | Wt 164.0 lb

## 2017-12-13 DIAGNOSIS — Z0000 Encounter for general adult medical examination without abnormal findings: Secondary | ICD-10-CM | POA: Diagnosis not present

## 2017-12-13 DIAGNOSIS — Z23 Encounter for immunization: Secondary | ICD-10-CM

## 2017-12-13 DIAGNOSIS — I10 Essential (primary) hypertension: Secondary | ICD-10-CM

## 2017-12-13 DIAGNOSIS — Z1231 Encounter for screening mammogram for malignant neoplasm of breast: Secondary | ICD-10-CM | POA: Diagnosis not present

## 2017-12-13 DIAGNOSIS — E2839 Other primary ovarian failure: Secondary | ICD-10-CM

## 2017-12-13 LAB — CBC
HCT: 40.8 % (ref 36.0–46.0)
Hemoglobin: 13.8 g/dL (ref 12.0–15.0)
MCHC: 33.9 g/dL (ref 30.0–36.0)
MCV: 87.3 fl (ref 78.0–100.0)
PLATELETS: 252 10*3/uL (ref 150.0–400.0)
RBC: 4.67 Mil/uL (ref 3.87–5.11)
RDW: 13.1 % (ref 11.5–15.5)
WBC: 4 10*3/uL (ref 4.0–10.5)

## 2017-12-13 LAB — COMPREHENSIVE METABOLIC PANEL
ALK PHOS: 69 U/L (ref 39–117)
ALT: 20 U/L (ref 0–35)
AST: 28 U/L (ref 0–37)
Albumin: 4.6 g/dL (ref 3.5–5.2)
BUN: 14 mg/dL (ref 6–23)
CALCIUM: 9.8 mg/dL (ref 8.4–10.5)
CO2: 26 mEq/L (ref 19–32)
CREATININE: 0.8 mg/dL (ref 0.40–1.20)
Chloride: 103 mEq/L (ref 96–112)
GFR: 75.69 mL/min (ref >60.00)
Glucose, Bld: 124 mg/dL — ABNORMAL HIGH (ref 70–99)
POTASSIUM: 4.1 meq/L (ref 3.5–5.1)
SODIUM: 138 meq/L (ref 135–145)
TOTAL PROTEIN: 8.1 g/dL (ref 6.0–8.3)
Total Bilirubin: 0.8 mg/dL (ref 0.2–1.2)

## 2017-12-13 LAB — LIPID PANEL
CHOLESTEROL: 216 mg/dL — AB (ref 0–200)
HDL: 50.6 mg/dL (ref >39.00)
LDL Cholesterol: 140 mg/dL — ABNORMAL HIGH (ref 0–99)
NonHDL: 165.16
TRIGLYCERIDES: 126 mg/dL (ref 0.0–149.0)
Total CHOL/HDL Ratio: 4
VLDL: 25.2 mg/dL (ref 0.0–40.0)

## 2017-12-13 LAB — HEMOGLOBIN A1C: Hgb A1c MFr Bld: 6.1 % (ref 4.6–6.5)

## 2017-12-13 MED ORDER — HYDROCHLOROTHIAZIDE 25 MG PO TABS: 25.0000 mg | ORAL_TABLET | Freq: Every day | ORAL | 3 refills | Status: DC

## 2017-12-13 NOTE — Progress Notes (Signed)
Medical screening examination/treatment/procedure(s) were performed by non-physician practitioner and as supervising physician I was immediately available for consultation/collaboration. I agree with above. Elizabeth A Crawford, MD 

## 2017-12-13 NOTE — Progress Notes (Signed)
   Subjective:    Patient ID: Raven Haynes, female    DOB: 13-Sep-1950, 67 y.o.   MRN: [id number]  HPI The patient is a 67 YO female coming in for physical.   PMH, [facility], social history reviewed and updated.   Review of Systems  Constitutional: Negative.   HENT: Negative.   Eyes: Negative.   Respiratory: Negative for cough, chest tightness and shortness of breath.   Cardiovascular: Negative for chest pain, palpitations and leg swelling.  Gastrointestinal: Negative for abdominal distention, abdominal pain, constipation, diarrhea, nausea and vomiting.  Musculoskeletal: Negative.   Skin: Negative.   Neurological: Negative.   Psychiatric/Behavioral: Negative.       Objective:   Physical Exam  Constitutional: She is oriented to person, place, and time. She appears well-developed and well-nourished.  HENT:  Head: Normocephalic and atraumatic.  Eyes: EOM are normal.  Neck: Normal range of motion.  Cardiovascular: Normal rate and regular rhythm.  Pulmonary/Chest: Effort normal and breath sounds normal. No respiratory distress. She has no wheezes. She has no rales.  Abdominal: Soft. Bowel sounds are normal. She exhibits no distension. There is no tenderness. There is no rebound.  Musculoskeletal: She exhibits no edema.  Neurological: She is alert and oriented to person, place, and time. Coordination normal.  Skin: Skin is warm and dry.  Psychiatric: She has a normal mood and affect.   Vitals:   12/13/17 0758  BP: (!) 150/90  Pulse: 71  Temp: 98 F (36.7 C)  TempSrc: Oral  SpO2: 97%  Weight: 164 lb (74.4 kg)  Height: 5\' 2"  (1.575 m)      Assessment & Plan:  Flu shot given at visit

## 2017-12-13 NOTE — Assessment & Plan Note (Signed)
Flu shot given. Pneumonia up to date. Shingrix counseled. Tetanus up to date. Colonoscopy up to date. Mammogram ordered, pap smear up to date and dexa ordered. Counseled about sun safety and mole surveillance. Counseled about the dangers of distracted driving. Given 10 year screening recommendations.

## 2017-12-13 NOTE — Addendum Note (Signed)
Addended by: Raford Pitcher R on: 12/13/2017 08:36 AM   Modules accepted: Orders

## 2017-12-13 NOTE — Assessment & Plan Note (Signed)
BP at goal, taking hctz 25 mg daily. Checking CMP and adjust as needed.  

## 2017-12-13 NOTE — Patient Instructions (Addendum)
Continue doing brain stimulating activities (puzzles, reading, adult coloring books, staying active) to keep memory sharp.   Continue to eat heart healthy diet (full of fruits, vegetables, whole grains, lean protein, water--limit salt, fat, and sugar intake) and increase physical activity as tolerated.   Raven Haynes , Thank you for taking time to come for your Medicare Wellness Visit. I appreciate your ongoing commitment to your health goals. Please review the following plan we discussed and let me know if I can assist you in the future.   These are the goals we discussed: Goals   None     This is a list of the screening recommended for you and due dates:  Health Maintenance  Topic Date Due  . Mammogram  12/20/2017  . Pneumonia vaccines (2 of 2 - PPSV23) 03/11/2019  . Tetanus Vaccine  09/08/2021  . Colon Cancer Screening  07/10/2023  . Flu Shot  Completed  .  Hepatitis C: One time screening is recommended by Center for Disease Control  (CDC) for  adults born from [birth year] through 1966.   Completed  . DEXA scan (bone density measurement)  Addressed    Health Maintenance, Female Adopting a healthy lifestyle and getting preventive care can go a long way to promote health and wellness. Talk with your health care provider about what schedule of regular examinations is right for you. This is a good chance for you to check in with your provider about disease prevention and staying healthy. In between checkups, there are plenty of things you can do on your own. Experts have done a lot of research about which lifestyle changes and preventive measures are most likely to keep you healthy. Ask your health care provider for more information. Weight and diet Eat a healthy diet  Be sure to include plenty of vegetables, fruits, low-fat dairy products, and lean protein.  Do not eat a lot of foods high in solid fats, added sugars, or salt.  Get regular exercise. This is one of the most important things  you can do for your health. ? Most adults should exercise for at least 150 minutes each week. The exercise should increase your heart rate and make you sweat (moderate-intensity exercise). ? Most adults should also do strengthening exercises at least twice a week. This is in addition to the moderate-intensity exercise.  Maintain a healthy weight  Body mass index (BMI) is a measurement that can be used to identify possible weight problems. It estimates body fat based on height and weight. Your health care provider can help determine your BMI and help you achieve or maintain a healthy weight.  For females 37 years of age and older: ? A BMI below 18.5 is considered underweight. ? A BMI of 18.5 to 24.9 is normal. ? A BMI of 25 to 29.9 is considered overweight. ? A BMI of 30 and above is considered obese.  Watch levels of cholesterol and blood lipids  You should start having your blood tested for lipids and cholesterol at 68 years of age, then have this test every 5 years.  You may need to have your cholesterol levels checked more often if: ? Your lipid or cholesterol levels are high. ? You are older than 68 years of age. ? You are at high risk for heart disease.  Cancer screening Lung Cancer  Lung cancer screening is recommended for adults 40-43 years old who are at high risk for lung cancer because of a history of smoking.  A yearly  low-dose CT scan of the lungs is recommended for people who: ? Currently smoke. ? Have quit within the past 15 years. ? Have at least a 30-pack-year history of smoking. A pack year is smoking an average of one pack of cigarettes a day for 1 year.  Yearly screening should continue until it has been 15 years since you quit.  Yearly screening should stop if you develop a health problem that would prevent you from having lung cancer treatment.  Breast Cancer  Practice breast self-awareness. This means understanding how your breasts normally appear and  feel.  It also means doing regular breast self-exams. Let your health care provider know about any changes, no matter how small.  If you are in your 20s or 30s, you should have a clinical breast exam (CBE) by a health care provider every 1-3 years as part of a regular health exam.  If you are 89 or older, have a CBE every year. Also consider having a breast X-ray (mammogram) every year.  If you have a family history of breast cancer, talk to your health care provider about genetic screening.  If you are at high risk for breast cancer, talk to your health care provider about having an MRI and a mammogram every year.  Breast cancer gene (BRCA) assessment is recommended for women who have family members with BRCA-related cancers. BRCA-related cancers include: ? Breast. ? Ovarian. ? Tubal. ? Peritoneal cancers.  Results of the assessment will determine the need for genetic counseling and BRCA1 and BRCA2 testing.  Cervical Cancer Your health care provider may recommend that you be screened regularly for cancer of the pelvic organs (ovaries, uterus, and vagina). This screening involves a pelvic examination, including checking for microscopic changes to the surface of your cervix (Pap test). You may be encouraged to have this screening done every 3 years, beginning at age 65.  For women ages 76-65, health care providers may recommend pelvic exams and Pap testing every 3 years, or they may recommend the Pap and pelvic exam, combined with testing for human papilloma virus (HPV), every 5 years. Some types of HPV increase your risk of cervical cancer. Testing for HPV may also be done on women of any age with unclear Pap test results.  Other health care providers may not recommend any screening for nonpregnant women who are considered low risk for pelvic cancer and who do not have symptoms. Ask your health care provider if a screening pelvic exam is right for you.  If you have had past treatment for  cervical cancer or a condition that could lead to cancer, you need Pap tests and screening for cancer for at least 20 years after your treatment. If Pap tests have been discontinued, your risk factors (such as having a new sexual partner) need to be reassessed to determine if screening should resume. Some women have medical problems that increase the chance of getting cervical cancer. In these cases, your health care provider may recommend more frequent screening and Pap tests.  Colorectal Cancer  This type of cancer can be detected and often prevented.  Routine colorectal cancer screening usually begins at 68 years of age and continues through 68 years of age.  Your health care provider may recommend screening at an earlier age if you have risk factors for colon cancer.  Your health care provider may also recommend using home test kits to check for hidden blood in the stool.  A small camera at the end of a  tube can be used to examine your colon directly (sigmoidoscopy or colonoscopy). This is done to check for the earliest forms of colorectal cancer.  Routine screening usually begins at age 90.  Direct examination of the colon should be repeated every 5-10 years through 68 years of age. However, you may need to be screened more often if early forms of precancerous polyps or small growths are found.  Skin Cancer  Check your skin from head to toe regularly.  Tell your health care provider about any new moles or changes in moles, especially if there is a change in a mole's shape or color.  Also tell your health care provider if you have a mole that is larger than the size of a pencil eraser.  Always use sunscreen. Apply sunscreen liberally and repeatedly throughout the day.  Protect yourself by wearing long sleeves, pants, a wide-brimmed hat, and sunglasses whenever you are outside.  Heart disease, diabetes, and high blood pressure  High blood pressure causes heart disease and increases  the risk of stroke. High blood pressure is more likely to develop in: ? People who have blood pressure in the high end of the normal range (130-139/85-89 mm Hg). ? People who are overweight or obese. ? People who are African American.  If you are 57-43 years of age, have your blood pressure checked every 3-5 years. If you are 49 years of age or older, have your blood pressure checked every year. You should have your blood pressure measured twice-once when you are at a hospital or clinic, and once when you are not at a hospital or clinic. Record the average of the two measurements. To check your blood pressure when you are not at a hospital or clinic, you can use: ? An automated blood pressure machine at a pharmacy. ? A home blood pressure monitor.  If you are between 37 years and 48 years old, ask your health care provider if you should take aspirin to prevent strokes.  Have regular diabetes screenings. This involves taking a blood sample to check your fasting blood sugar level. ? If you are at a normal weight and have a low risk for diabetes, have this test once every three years after 68 years of age. ? If you are overweight and have a high risk for diabetes, consider being tested at a younger age or more often. Preventing infection Hepatitis B  If you have a higher risk for hepatitis B, you should be screened for this virus. You are considered at high risk for hepatitis B if: ? You were born in a country where hepatitis B is common. Ask your health care provider which countries are considered high risk. ? Your parents were born in a high-risk country, and you have not been immunized against hepatitis B (hepatitis B vaccine). ? You have HIV or AIDS. ? You use needles to inject street drugs. ? You live with someone who has hepatitis B. ? You have had sex with someone who has hepatitis B. ? You get hemodialysis treatment. ? You take certain medicines for conditions, including cancer, organ  transplantation, and autoimmune conditions.  Hepatitis C  Blood testing is recommended for: ? Everyone born from [birth year] through 1966. ? Anyone with known risk factors for hepatitis C.  Sexually transmitted infections (STIs)  You should be screened for sexually transmitted infections (STIs) including gonorrhea and chlamydia if: ? You are sexually active and are younger than 68 years of age. ? You are older than  68 years of age and your health care provider tells you that you are at risk for this type of infection. ? Your sexual activity has changed since you were last screened and you are at an increased risk for chlamydia or gonorrhea. Ask your health care provider if you are at risk.  If you do not have HIV, but are at risk, it may be recommended that you take a prescription medicine daily to prevent HIV infection. This is called pre-exposure prophylaxis (PrEP). You are considered at risk if: ? You are sexually active and do not regularly use condoms or know the HIV status of your partner(s). ? You take drugs by injection. ? You are sexually active with a partner who has HIV.  Talk with your health care provider about whether you are at high risk of being infected with HIV. If you choose to begin PrEP, you should first be tested for HIV. You should then be tested every 3 months for as long as you are taking PrEP. Pregnancy  If you are premenopausal and you may become pregnant, ask your health care provider about preconception counseling.  If you may become pregnant, take 400 to 800 micrograms (mcg) of folic acid every day.  If you want to prevent pregnancy, talk to your health care provider about birth control (contraception). Osteoporosis and menopause  Osteoporosis is a disease in which the bones lose minerals and strength with aging. This can result in serious bone fractures. Your risk for osteoporosis can be identified using a bone density scan.  If you are 65 years of age or  older, or if you are at risk for osteoporosis and fractures, ask your health care provider if you should be screened.  Ask your health care provider whether you should take a calcium or vitamin D supplement to lower your risk for osteoporosis.  Menopause may have certain physical symptoms and risks.  Hormone replacement therapy may reduce some of these symptoms and risks. Talk to your health care provider about whether hormone replacement therapy is right for you. Follow these instructions at home:  Schedule regular health, dental, and eye exams.  Stay current with your immunizations.  Do not use any tobacco products including cigarettes, chewing tobacco, or electronic cigarettes.  If you are pregnant, do not drink alcohol.  If you are breastfeeding, limit how much and how often you drink alcohol.  Limit alcohol intake to no more than 1 drink per day for nonpregnant women. One drink equals 12 ounces of beer, 5 ounces of [place], or 1 ounces of hard liquor.  Do not use street drugs.  Do not share needles.  Ask your health care provider for help if you need support or information about quitting drugs.  Tell your health care provider if you often feel depressed.  Tell your health care provider if you have ever been abused or do not feel safe at home. This information is not intended to replace advice given to you by your health care provider. Make sure you discuss any questions you have with your health care provider. Document Released: 09/11/2010 Document Revised: 08/04/2015 Document Reviewed: 11/30/2014 [employer] Interactive Patient Education  2018 [employer]  

## 2017-12-13 NOTE — Patient Instructions (Signed)
We will get the bone density test and the labs today.   Health Maintenance, Female Adopting a healthy lifestyle and getting preventive care can go a long way to promote health and wellness. Talk with your health care provider about what schedule of regular examinations is right for you. This is a good chance for you to check in with your provider about disease prevention and staying healthy. In between checkups, there are plenty of things you can do on your own. Experts have done a lot of research about which lifestyle changes and preventive measures are most likely to keep you healthy. Ask your health care provider for more information. Weight and diet Eat a healthy diet  Be sure to include plenty of vegetables, fruits, low-fat dairy products, and lean protein.  Do not eat a lot of foods high in solid fats, added sugars, or salt.  Get regular exercise. This is one of the most important things you can do for your health. ? Most adults should exercise for at least 150 minutes each week. The exercise should increase your heart rate and make you sweat (moderate-intensity exercise). ? Most adults should also do strengthening exercises at least twice a week. This is in addition to the moderate-intensity exercise.  Maintain a healthy weight  Body mass index (BMI) is a measurement that can be used to identify possible weight problems. It estimates body fat based on height and weight. Your health care provider can help determine your BMI and help you achieve or maintain a healthy weight.  For females 7 years of age and older: ? A BMI below 18.5 is considered underweight. ? A BMI of 18.5 to 24.9 is normal. ? A BMI of 25 to 29.9 is considered overweight. ? A BMI of 30 and above is considered obese.  Watch levels of cholesterol and blood lipids  You should start having your blood tested for lipids and cholesterol at 68 years of age, then have this test every 5 years.  You may need to have your  cholesterol levels checked more often if: ? Your lipid or cholesterol levels are high. ? You are older than 68 years of age. ? You are at high risk for heart disease.  Cancer screening Lung Cancer  Lung cancer screening is recommended for adults 77-21 years old who are at high risk for lung cancer because of a history of smoking.  A yearly low-dose CT scan of the lungs is recommended for people who: ? Currently smoke. ? Have quit within the past 15 years. ? Have at least a 30-pack-year history of smoking. A pack year is smoking an average of one pack of cigarettes a day for 1 year.  Yearly screening should continue until it has been 15 years since you quit.  Yearly screening should stop if you develop a health problem that would prevent you from having lung cancer treatment.  Breast Cancer  Practice breast self-awareness. This means understanding how your breasts normally appear and feel.  It also means doing regular breast self-exams. Let your health care provider know about any changes, no matter how small.  If you are in your 20s or 30s, you should have a clinical breast exam (CBE) by a health care provider every 1-3 years as part of a regular health exam.  If you are 8 or older, have a CBE every year. Also consider having a breast X-ray (mammogram) every year.  If you have a family history of breast cancer, talk to your  health care provider about genetic screening.  If you are at high risk for breast cancer, talk to your health care provider about having an MRI and a mammogram every year.  Breast cancer gene (BRCA) assessment is recommended for women who have family members with BRCA-related cancers. BRCA-related cancers include: ? Breast. ? Ovarian. ? Tubal. ? Peritoneal cancers.  Results of the assessment will determine the need for genetic counseling and BRCA1 and BRCA2 testing.  Cervical Cancer Your health care provider may recommend that you be screened regularly  for cancer of the pelvic organs (ovaries, uterus, and vagina). This screening involves a pelvic examination, including checking for microscopic changes to the surface of your cervix (Pap test). You may be encouraged to have this screening done every 3 years, beginning at age 79.  For women ages 29-65, health care providers may recommend pelvic exams and Pap testing every 3 years, or they may recommend the Pap and pelvic exam, combined with testing for human papilloma virus (HPV), every 5 years. Some types of HPV increase your risk of cervical cancer. Testing for HPV may also be done on women of any age with unclear Pap test results.  Other health care providers may not recommend any screening for nonpregnant women who are considered low risk for pelvic cancer and who do not have symptoms. Ask your health care provider if a screening pelvic exam is right for you.  If you have had past treatment for cervical cancer or a condition that could lead to cancer, you need Pap tests and screening for cancer for at least 20 years after your treatment. If Pap tests have been discontinued, your risk factors (such as having a new sexual partner) need to be reassessed to determine if screening should resume. Some women have medical problems that increase the chance of getting cervical cancer. In these cases, your health care provider may recommend more frequent screening and Pap tests.  Colorectal Cancer  This type of cancer can be detected and often prevented.  Routine colorectal cancer screening usually begins at 68 years of age and continues through 68 years of age.  Your health care provider may recommend screening at an earlier age if you have risk factors for colon cancer.  Your health care provider may also recommend using home test kits to check for hidden blood in the stool.  A small camera at the end of a tube can be used to examine your colon directly (sigmoidoscopy or colonoscopy). This is done to  check for the earliest forms of colorectal cancer.  Routine screening usually begins at age 48.  Direct examination of the colon should be repeated every 5-10 years through 68 years of age. However, you may need to be screened more often if early forms of precancerous polyps or small growths are found.  Skin Cancer  Check your skin from head to toe regularly.  Tell your health care provider about any new moles or changes in moles, especially if there is a change in a mole's shape or color.  Also tell your health care provider if you have a mole that is larger than the size of a pencil eraser.  Always use sunscreen. Apply sunscreen liberally and repeatedly throughout the day.  Protect yourself by wearing long sleeves, pants, a wide-brimmed hat, and sunglasses whenever you are outside.  Heart disease, diabetes, and high blood pressure  High blood pressure causes heart disease and increases the risk of stroke. High blood pressure is more likely to  develop in: ? People who have blood pressure in the high end of the normal range (130-139/85-89 mm Hg). ? People who are overweight or obese. ? People who are African American.  If you are 33-59 years of age, have your blood pressure checked every 3-5 years. If you are 66 years of age or older, have your blood pressure checked every year. You should have your blood pressure measured twice-once when you are at a hospital or clinic, and once when you are not at a hospital or clinic. Record the average of the two measurements. To check your blood pressure when you are not at a hospital or clinic, you can use: ? An automated blood pressure machine at a pharmacy. ? A home blood pressure monitor.  If you are between 20 years and 51 years old, ask your health care provider if you should take aspirin to prevent strokes.  Have regular diabetes screenings. This involves taking a blood sample to check your fasting blood sugar level. ? If you are at a  normal weight and have a low risk for diabetes, have this test once every three years after 68 years of age. ? If you are overweight and have a high risk for diabetes, consider being tested at a younger age or more often. Preventing infection Hepatitis B  If you have a higher risk for hepatitis B, you should be screened for this virus. You are considered at high risk for hepatitis B if: ? You were born in a country where hepatitis B is common. Ask your health care provider which countries are considered high risk. ? Your parents were born in a high-risk country, and you have not been immunized against hepatitis B (hepatitis B vaccine). ? You have HIV or AIDS. ? You use needles to inject street drugs. ? You live with someone who has hepatitis B. ? You have had sex with someone who has hepatitis B. ? You get hemodialysis treatment. ? You take certain medicines for conditions, including cancer, organ transplantation, and autoimmune conditions.  Hepatitis C  Blood testing is recommended for: ? Everyone born from [birth year] through 1965. ? Anyone with known risk factors for hepatitis C.  Sexually transmitted infections (STIs)  You should be screened for sexually transmitted infections (STIs) including gonorrhea and chlamydia if: ? You are sexually active and are younger than 68 years of age. ? You are older than 68 years of age and your health care provider tells you that you are at risk for this type of infection. ? Your sexual activity has changed since you were last screened and you are at an increased risk for chlamydia or gonorrhea. Ask your health care provider if you are at risk.  If you do not have HIV, but are at risk, it may be recommended that you take a prescription medicine daily to prevent HIV infection. This is called pre-exposure prophylaxis (PrEP). You are considered at risk if: ? You are sexually active and do not regularly use condoms or know the HIV status of your  partner(s). ? You take drugs by injection. ? You are sexually active with a partner who has HIV.  Talk with your health care provider about whether you are at high risk of being infected with HIV. If you choose to begin PrEP, you should first be tested for HIV. You should then be tested every 3 months for as long as you are taking PrEP. Pregnancy  If you are premenopausal and you may become pregnant,  ask your health care provider about preconception counseling.  If you may become pregnant, take 400 to 800 micrograms (mcg) of folic acid every day.  If you want to prevent pregnancy, talk to your health care provider about birth control (contraception). Osteoporosis and menopause  Osteoporosis is a disease in which the bones lose minerals and strength with aging. This can result in serious bone fractures. Your risk for osteoporosis can be identified using a bone density scan.  If you are 42 years of age or older, or if you are at risk for osteoporosis and fractures, ask your health care provider if you should be screened.  Ask your health care provider whether you should take a calcium or vitamin D supplement to lower your risk for osteoporosis.  Menopause may have certain physical symptoms and risks.  Hormone replacement therapy may reduce some of these symptoms and risks. Talk to your health care provider about whether hormone replacement therapy is right for you. Follow these instructions at home:  Schedule regular health, dental, and eye exams.  Stay current with your immunizations.  Do not use any tobacco products including cigarettes, chewing tobacco, or electronic cigarettes.  If you are pregnant, do not drink alcohol.  If you are breastfeeding, limit how much and how often you drink alcohol.  Limit alcohol intake to no more than 1 drink per day for nonpregnant women. One drink equals 12 ounces of beer, 5 ounces of wine, or 1 ounces of hard liquor.  Do not use street  drugs.  Do not share needles.  Ask your health care provider for help if you need support or information about quitting drugs.  Tell your health care provider if you often feel depressed.  Tell your health care provider if you have ever been abused or do not feel safe at home. This information is not intended to replace advice given to you by your health care provider. Make sure you discuss any questions you have with your health care provider. Document Released: 09/11/2010 Document Revised: 08/04/2015 Document Reviewed: 11/30/2014 Elsevier Interactive Patient Education  [employer].

## 2018-02-12 ENCOUNTER — Ambulatory Visit
Admission: RE | Admit: 2018-02-12 | Discharge: 2018-02-12 | Disposition: A | Discharge status: Home / Self Care | Payer: Medicare HMO | Source: Ambulatory Visit | Attending: Internal Medicine | Admitting: Internal Medicine

## 2018-02-12 DIAGNOSIS — M8589 Other specified disorders of bone density and structure, multiple sites: Secondary | ICD-10-CM | POA: Diagnosis not present

## 2018-02-12 DIAGNOSIS — Z1231 Encounter for screening mammogram for malignant neoplasm of breast: Secondary | ICD-10-CM | POA: Diagnosis not present

## 2018-02-12 DIAGNOSIS — Z780 Asymptomatic menopausal state: Secondary | ICD-10-CM | POA: Diagnosis not present

## 2018-02-12 DIAGNOSIS — E2839 Other primary ovarian failure: Secondary | ICD-10-CM

## 2018-12-18 NOTE — Progress Notes (Signed)
Subjective:   Raven Haynes is a 68 y.o. female who presents for Medicare Annual (Subsequent) preventive examination.  Review of Systems:   Cardiac Risk Factors include: advanced age (>83men, >45 women) Sleep patterns: feels rested on waking, gets up 1-2 times nightly to void and sleeps 7-8 hours nightly.    Home Safety/Smoke Alarms: Feels safe in home. Smoke alarms in place.  Living environment; residence and Firearm Safety: 1-story house/ trailer Lives with husband, no needs for DME, good support system. Seat Belt Safety/Bike Helmet: Wears seat belt.     Objective:     Vitals: BP 140/90   Pulse 77   Ht 5\' 2"  (1.575 m)   Wt 171 lb (77.6 kg)   SpO2 99%   BMI 31.28 kg/m   Body mass index is 31.28 kg/m.  Advanced Directives 12/19/2018 12/13/2017  Does Patient Have a Medical Advance Directive? Yes Yes  Type of Paramedic of [place];Living will [place];Living will  Copy of [place] in Chart? No - copy requested No - copy requested    Tobacco Social History   Tobacco Use  Smoking Status Never Smoker  Smokeless Tobacco Never Used     Counseling given: Not Answered  Past Medical History:  Diagnosis Date  . Hypertension    Past Surgical History:  Procedure Laterality Date  . KNEE SURGERY Bilateral 2004,2009   Family History  Problem Relation Age of Onset  . Diabetes Mother   . Breast cancer Neg Hx    Social History   Socioeconomic History  . Marital status: Married    Spouse name: Not on file  . Number of children: 2  . Years of education: Not on file  . Highest education level: Not on file  Occupational History  . Occupation: owns Radio broadcast assistant: [employer]  Social Needs  . Financial resource strain: Not hard at all  . Food insecurity    Worry: Never true    Inability: Never true  . Transportation needs    Medical: No    Non-medical: No  Tobacco Use  .  Smoking status: Never Smoker  . Smokeless tobacco: Never Used  Substance and Sexual Activity  . Alcohol use: No    Alcohol/week: 0.0 standard drinks  . Drug use: No  . Sexual activity: Yes  Lifestyle  . Physical activity    Days per week: 3 days    Minutes per session: 60 min  . Stress: Only a little  Relationships  . Social connections    Talks on phone: More than three times a week    Gets together: More than three times a week    Attends religious service: More than 4 times per year    Active member of club or organization: Yes    Attends meetings of clubs or organizations: More than 4 times per year    Relationship status: Married  Other Topics Concern  . Not on file  Social History Narrative  . Not on file    Outpatient Encounter Medications as of 12/19/2018  Medication Sig  . B Complex-C (SUPER B COMPLEX PO) Take by mouth.  . Calcium Citrate-Vitamin D (CITRACAL + D PO) Take by mouth.  . Cholecalciferol (D3-1000 PO) Take by mouth.  . Cyanocobalamin (VITAMIN B 12 PO) Take by mouth.  . hydrochlorothiazide (HYDRODIURIL) 25 MG tablet Take 1 tablet (25 mg total) by mouth daily.  . Magnesium 400 MG  CAPS Take by mouth.  . Red Yeast Rice Extract (RED YEAST RICE PO) Take by mouth.   No facility-administered encounter medications on file as of 12/19/2018.     Activities of Daily Living In your present state of health, do you have any difficulty performing the following activities: 12/19/2018  Hearing? N  Vision? N  Difficulty concentrating or making decisions? N  Walking or climbing stairs? N  Dressing or bathing? N  Doing errands, shopping? N  Preparing Food and eating ? N  Using the Toilet? N  In the past six months, have you accidently leaked urine? N  Do you have problems with loss of bowel control? N  Managing your Medications? N  Managing your Finances? N  Housekeeping or managing your Housekeeping? N  Some recent data might be hidden    Patient Care Team:  Hoyt Koch, MD as PCP - General (Internal Medicine)    Assessment:   This is a routine wellness examination for Raven Haynes. Physical assessment deferred to PCP.  Exercise Activities and Dietary recommendations Current Exercise Habits: Home exercise routine, Type of exercise: walking;calisthenics, Time (Minutes): 45, Frequency (Times/Week): 3, Weekly Exercise (Minutes/Week): 135, Intensity: Mild, Exercise limited by: None identified Diet (meal preparation, eat out, water intake, caffeinated beverages, dairy products, fruits and vegetables): in general, a "healthy" diet  , well balanced   Reviewed heart healthy diet. Encouraged patient to increase daily water and healthy fluid intake.  Goals    . Patient Stated     I want to reduce that amount of salt that I eat,  use portion control and continue to exercise with my husband. I want to take some time just for me to relax and meditate.       Fall Risk Fall Risk  12/19/2018 12/19/2018 12/13/2017 12/13/2017 12/10/2016  Falls in the past year? 0 0 No No No  Number falls in past yr: 0 - - - -  Injury with Fall? 0 - - - -  Follow up Falls prevention discussed - - - -    Depression Screen PHQ 2/9 Scores 12/19/2018 12/13/2017 12/13/2017 12/10/2016  PHQ - 2 Score 0 0 0 0     Cognitive Function       Ad8 score reviewed for issues:  Issues making decisions: no  Less interest in hobbies / activities: no  Repeats questions, stories (family complaining): no  Trouble using ordinary gadgets (microwave, computer, phone):no  Forgets the month or year: no  Mismanaging finances: no  Remembering appts: no  Daily problems with thinking and/or memory: no Ad8 score is= 0  Immunization History  Administered Date(s) Administered  . Fluad Quad(high Dose 65+) 12/19/2018  . Influenza, High Dose Seasonal PF 12/10/2016, 12/13/2017  . Influenza,inj,Quad PF,6+ Mos 11/15/2015  . Pneumococcal Conjugate-13 03/22/2015  . Pneumococcal  Polysaccharide-23 03/10/2014  . Tdap 09/09/2011  . Zoster 12/24/2013  . Zoster Recombinat (Shingrix) 10/02/2017    Screening Tests Health Maintenance  Topic Date Due  . PNA vac Low Risk Adult (2 of 2 - PPSV23) 03/11/2019  . MAMMOGRAM  02/13/2020  . TETANUS/TDAP  09/08/2021  . COLONOSCOPY  07/10/2023  . INFLUENZA VACCINE  Completed  . Hepatitis C Screening  Completed  . DEXA SCAN  Addressed        Plan:     I have personally reviewed and noted the following in the patient's chart:   . Medical and social history . Use of alcohol, tobacco or illicit drugs  . Current medications  and supplements . Functional ability and status . Nutritional status . Physical activity . Advanced directives . List of other physicians . Vitals . Screenings to include cognitive, depression, and falls . Referrals and appointments  In addition, I have reviewed and discussed with patient certain preventive protocols, quality metrics, and best practice recommendations. A written personalized care plan for preventive services as well as general preventive health recommendations were provided to patient.     Michiel Cowboy, RN  12/19/2018

## 2018-12-19 ENCOUNTER — Other Ambulatory Visit (INDEPENDENT_AMBULATORY_CARE_PROVIDER_SITE_OTHER): Payer: Medicare HMO

## 2018-12-19 ENCOUNTER — Ambulatory Visit (INDEPENDENT_AMBULATORY_CARE_PROVIDER_SITE_OTHER): Payer: Medicare HMO | Admitting: Internal Medicine

## 2018-12-19 ENCOUNTER — Ambulatory Visit (INDEPENDENT_AMBULATORY_CARE_PROVIDER_SITE_OTHER): Payer: Medicare HMO | Admitting: *Deleted

## 2018-12-19 ENCOUNTER — Encounter: Payer: Self-pay | Admitting: Internal Medicine

## 2018-12-19 ENCOUNTER — Other Ambulatory Visit: Payer: Self-pay

## 2018-12-19 VITALS — BP 140/90 | HR 77 | Temp 97.7°F | Ht 62.0 in | Wt 171.0 lb

## 2018-12-19 VITALS — BP 140/90 | HR 77 | Ht 62.0 in | Wt 171.0 lb

## 2018-12-19 DIAGNOSIS — Z0000 Encounter for general adult medical examination without abnormal findings: Secondary | ICD-10-CM

## 2018-12-19 DIAGNOSIS — R7301 Impaired fasting glucose: Secondary | ICD-10-CM | POA: Diagnosis not present

## 2018-12-19 DIAGNOSIS — Z23 Encounter for immunization: Secondary | ICD-10-CM

## 2018-12-19 DIAGNOSIS — I10 Essential (primary) hypertension: Secondary | ICD-10-CM | POA: Diagnosis not present

## 2018-12-19 LAB — COMPREHENSIVE METABOLIC PANEL
ALT: 15 U/L (ref 0–35)
AST: 17 U/L (ref 0–37)
Albumin: 4.4 g/dL (ref 3.5–5.2)
Alkaline Phosphatase: 69 U/L (ref 39–117)
BUN: 15 mg/dL (ref 6–23)
CO2: 29 mEq/L (ref 19–32)
Calcium: 9.9 mg/dL (ref 8.4–10.5)
Chloride: 103 mEq/L (ref 96–112)
Creatinine, Ser: 0.77 mg/dL (ref 0.40–1.20)
GFR: 74.21 mL/min (ref >60.00)
Glucose, Bld: 120 mg/dL — ABNORMAL HIGH (ref 70–99)
Potassium: 3.8 mEq/L (ref 3.5–5.1)
Sodium: 139 mEq/L (ref 135–145)
Total Bilirubin: 0.5 mg/dL (ref 0.2–1.2)
Total Protein: 7.5 g/dL (ref 6.0–8.3)

## 2018-12-19 LAB — CBC
HCT: 40 % (ref 36.0–46.0)
Hemoglobin: 13.2 g/dL (ref 12.0–15.0)
MCHC: 33 g/dL (ref 30.0–36.0)
MCV: 88.2 fl (ref 78.0–100.0)
Platelets: 277 10*3/uL (ref 150.0–400.0)
RBC: 4.54 Mil/uL (ref 3.87–5.11)
RDW: 13 % (ref 11.5–15.5)
WBC: 4.8 10*3/uL (ref 4.0–10.5)

## 2018-12-19 LAB — LIPID PANEL
Cholesterol: 186 mg/dL (ref 0–200)
HDL: 46.3 mg/dL (ref >39.00)
LDL Cholesterol: 108 mg/dL — ABNORMAL HIGH (ref 0–99)
NonHDL: 139.68
Total CHOL/HDL Ratio: 4
Triglycerides: 156 mg/dL — ABNORMAL HIGH (ref 0.0–149.0)
VLDL: 31.2 mg/dL (ref 0.0–40.0)

## 2018-12-19 LAB — HEMOGLOBIN A1C: Hgb A1c MFr Bld: 6.5 % (ref 4.6–6.5)

## 2018-12-19 NOTE — Patient Instructions (Signed)
Continue doing brain stimulating activities (puzzles, reading, adult coloring books, staying active) to keep memory sharp.   Continue to eat heart healthy diet (full of fruits, vegetables, whole grains, lean protein, water--limit salt, fat, and sugar intake) and increase physical activity as tolerated.    Raven Haynes , Thank you for taking time to come for your Medicare Wellness Visit. I appreciate your ongoing commitment to your health goals. Please review the following plan we discussed and let me know if I can assist you in the future.   These are the goals we discussed: Goals    . Patient Stated     I want to reduce that amount of salt that I eat,  use portion control and continue to exercise with my husband. I want to take some time just for me to relax and meditate.       This is a list of the screening recommended for you and due dates:  Health Maintenance  Topic Date Due  . Pneumonia vaccines (2 of 2 - PPSV23) 03/11/2019  . Mammogram  02/13/2020  . Tetanus Vaccine  09/08/2021  . Colon Cancer Screening  07/10/2023  . Flu Shot  Completed  .  Hepatitis C: One time screening is recommended by Center for Disease Control  (CDC) for  adults born from [birth year] through 1966.   Completed  . DEXA scan (bone density measurement)  Addressed

## 2018-12-19 NOTE — Progress Notes (Signed)
Medical screening examination/treatment/procedure(s) were performed by non-physician practitioner and as supervising physician I was immediately available for consultation/collaboration. I agree with above. Kaleya Douse A Kylar Speelman, MD 

## 2018-12-19 NOTE — Assessment & Plan Note (Signed)
Flu shot given. Pneumonia 23 due 02/2019. Shingrix counseled. Tetanus due 2023. Colonoscopy due 2025. Mammogram due 2021, pap smear aged out and dexa due 2021. Counseled about sun safety and mole surveillance. Counseled about the dangers of distracted driving. Given 10 year screening recommendations.

## 2018-12-19 NOTE — Progress Notes (Signed)
   Subjective:   Patient ID: Raven Haynes, female    DOB: [birth year], 68 y.o.   MRN: [id number]  HPI The patient is a 68 YO female coming in for physical.   PMH, [facility], social history reviewed and updated  Review of Systems  Constitutional: Negative.   HENT: Negative.   Eyes: Negative.   Respiratory: Negative for cough, chest tightness and shortness of breath.   Cardiovascular: Negative for chest pain, palpitations and leg swelling.  Gastrointestinal: Negative for abdominal distention, abdominal pain, constipation, diarrhea, nausea and vomiting.  Musculoskeletal: Negative.   Skin: Negative.   Neurological: Negative.   Psychiatric/Behavioral: Negative.     Objective:  Physical Exam Constitutional:      Appearance: She is well-developed.  HENT:     Head: Normocephalic and atraumatic.  Neck:     Musculoskeletal: Normal range of motion.  Cardiovascular:     Rate and Rhythm: Normal rate and regular rhythm.  Pulmonary:     Effort: Pulmonary effort is normal. No respiratory distress.     Breath sounds: Normal breath sounds. No wheezing or rales.  Abdominal:     General: Bowel sounds are normal. There is no distension.     Palpations: Abdomen is soft.     Tenderness: There is no abdominal tenderness. There is no rebound.  Skin:    General: Skin is warm and dry.  Neurological:     Mental Status: She is alert and oriented to person, place, and time.     Coordination: Coordination normal.     Vitals:   12/19/18 0755  BP: (!) 160/110  Pulse: 77  Temp: 97.7 F (36.5 C)  TempSrc: Oral  SpO2: 99%  Weight: 171 lb (77.6 kg)  Height: 5\' 2"  (1.575 m)    Assessment & Plan:  Flu shot given at visit

## 2018-12-19 NOTE — Assessment & Plan Note (Signed)
Checking HgA1c, pre-diabetic range.

## 2018-12-19 NOTE — Assessment & Plan Note (Signed)
BP elevated initially and better on recheck. Declines change today and declines EKG due to time today and will do that next year. Checking CMP and adjust as needed.

## 2018-12-19 NOTE — Patient Instructions (Signed)
Health Maintenance, Female Adopting a healthy lifestyle and getting preventive care are important in promoting health and wellness. Ask your health care provider about:  The right schedule for you to have regular tests and exams.  Things you can do on your own to prevent diseases and keep yourself healthy. What should I know about diet, weight, and exercise? Eat a healthy diet   Eat a diet that includes plenty of vegetables, fruits, low-fat dairy products, and lean protein.  Do not eat a lot of foods that are high in solid fats, added sugars, or sodium. Maintain a healthy weight Body mass index (BMI) is used to identify weight problems. It estimates body fat based on height and weight. Your health care provider can help determine your BMI and help you achieve or maintain a healthy weight. Get regular exercise Get regular exercise. This is one of the most important things you can do for your health. Most adults should:  Exercise for at least 150 minutes each week. The exercise should increase your heart rate and make you sweat (moderate-intensity exercise).  Do strengthening exercises at least twice a week. This is in addition to the moderate-intensity exercise.  Spend less time sitting. Even light physical activity can be beneficial. Watch cholesterol and blood lipids Have your blood tested for lipids and cholesterol at 69 years of age, then have this test every 5 years. Have your cholesterol levels checked more often if:  Your lipid or cholesterol levels are high.  You are older than 69 years of age.  You are at high risk for heart disease. What should I know about cancer screening? Depending on your health history and family history, you may need to have cancer screening at various ages. This may include screening for:  Breast cancer.  Cervical cancer.  Colorectal cancer.  Skin cancer.  Lung cancer. What should I know about heart disease, diabetes, and high blood  pressure? Blood pressure and heart disease  High blood pressure causes heart disease and increases the risk of stroke. This is more likely to develop in people who have high blood pressure readings, are of African descent, or are overweight.  Have your blood pressure checked: ? Every 3-5 years if you are 18-39 years of age. ? Every year if you are 40 years old or older. Diabetes Have regular diabetes screenings. This checks your fasting blood sugar level. Have the screening done:  Once every three years after age 40 if you are at a normal weight and have a low risk for diabetes.  More often and at a younger age if you are overweight or have a high risk for diabetes. What should I know about preventing infection? Hepatitis B If you have a higher risk for hepatitis B, you should be screened for this virus. Talk with your health care provider to find out if you are at risk for hepatitis B infection. Hepatitis C Testing is recommended for:  Everyone born from 1945 through 1965.  Anyone with known risk factors for hepatitis C. Sexually transmitted infections (STIs)  Get screened for STIs, including gonorrhea and chlamydia, if: ? You are sexually active and are younger than 69 years of age. ? You are older than 69 years of age and your health care provider tells you that you are at risk for this type of infection. ? Your sexual activity has changed since you were last screened, and you are at increased risk for chlamydia or gonorrhea. Ask your health care provider if   you are at risk.  Ask your health care provider about whether you are at high risk for HIV. Your health care provider may recommend a prescription medicine to help prevent HIV infection. If you choose to take medicine to prevent HIV, you should first get tested for HIV. You should then be tested every 3 months for as long as you are taking the medicine. Pregnancy  If you are about to stop having your period (premenopausal) and  you may become pregnant, seek counseling before you get pregnant.  Take 400 to 800 micrograms (mcg) of folic acid every day if you become pregnant.  Ask for birth control (contraception) if you want to prevent pregnancy. Osteoporosis and menopause Osteoporosis is a disease in which the bones lose minerals and strength with aging. This can result in bone fractures. If you are 65 years old or older, or if you are at risk for osteoporosis and fractures, ask your health care provider if you should:  Be screened for bone loss.  Take a calcium or vitamin D supplement to lower your risk of fractures.  Be given hormone replacement therapy (HRT) to treat symptoms of menopause. Follow these instructions at home: Lifestyle  Do not use any products that contain nicotine or tobacco, such as cigarettes, e-cigarettes, and chewing tobacco. If you need help quitting, ask your health care provider.  Do not use street drugs.  Do not share needles.  Ask your health care provider for help if you need support or information about quitting drugs. Alcohol use  Do not drink alcohol if: ? Your health care provider tells you not to drink. ? You are pregnant, may be pregnant, or are planning to become pregnant.  If you drink alcohol: ? Limit how much you use to 0-1 drink a day. ? Limit intake if you are breastfeeding.  Be aware of how much alcohol is in your drink. In the [place] one drink equals one 12 oz bottle of beer (355 mL), one 5 oz glass of wine (148 mL), or one 1 oz glass of hard liquor (44 mL). General instructions  Schedule regular health, dental, and eye exams.  Stay current with your vaccines.  Tell your health care provider if: ? You often feel depressed. ? You have ever been abused or do not feel safe at home. Summary  Adopting a healthy lifestyle and getting preventive care are important in promoting health and wellness.  Follow your health care provider's instructions about healthy  diet, exercising, and getting tested or screened for diseases.  Follow your health care provider's instructions on monitoring your cholesterol and blood pressure. This information is not intended to replace advice given to you by your health care provider. Make sure you discuss any questions you have with your health care provider. Document Released: 09/11/2010 Document Revised: 02/19/2018 Document Reviewed: 02/19/2018 Elsevier Patient Education  2020 Elsevier Inc.  

## 2019-02-09 ENCOUNTER — Other Ambulatory Visit: Payer: Self-pay | Admitting: Internal Medicine

## 2019-03-31 ENCOUNTER — Ambulatory Visit: Payer: Medicare Other | Attending: Internal Medicine

## 2019-03-31 DIAGNOSIS — Z23 Encounter for immunization: Secondary | ICD-10-CM | POA: Insufficient documentation

## 2019-03-31 NOTE — Progress Notes (Signed)
   Covid-19 Vaccination Clinic  Name:  Raven Haynes    MRN: [id number] DOB: 1951-02-14  03/31/2019  [name] was observed post Covid-19 immunization for 15 minutes without incidence. She was provided with Vaccine Information Sheet and instruction to access the V-Safe system.   [name] was instructed to call 911 with any severe reactions post vaccine: Marland Kitchen Difficulty breathing  . Swelling of your face and throat  . A fast heartbeat  . A bad rash all over your body  . Dizziness and weakness    Immunizations Administered    Name Date Dose VIS Date Route   Pfizer COVID-19 Vaccine 03/31/2019  5:25 PM 0.3 mL 02/20/2019 Intramuscular   Manufacturer: [employer], [employer]   Lot: [id number]   [place]: [id number]

## 2019-04-18 ENCOUNTER — Ambulatory Visit: Payer: Medicare HMO | Attending: Internal Medicine

## 2019-04-18 DIAGNOSIS — Z23 Encounter for immunization: Secondary | ICD-10-CM | POA: Insufficient documentation

## 2019-04-18 NOTE — Progress Notes (Signed)
   Covid-19 Vaccination Clinic  Name:  Raven Haynes    MRN: [id number] DOB: 09-06-1950  04/18/2019  [name] was observed post Covid-19 immunization for 15 minutes without incidence. She was provided with Vaccine Information Sheet and instruction to access the V-Safe system.   [name] was instructed to call 911 with any severe reactions post vaccine: Marland Kitchen Difficulty breathing  . Swelling of your face and throat  . A fast heartbeat  . A bad rash all over your body  . Dizziness and weakness    Immunizations Administered    Name Date Dose VIS Date Route   Pfizer COVID-19 Vaccine 04/18/2019 11:43 AM 0.3 mL 02/20/2019 Intramuscular   Manufacturer: [place]   Lot: [id number]   [place]: [id number]

## 2019-05-22 ENCOUNTER — Ambulatory Visit (INDEPENDENT_AMBULATORY_CARE_PROVIDER_SITE_OTHER): Payer: Medicare HMO | Admitting: Internal Medicine

## 2019-05-22 ENCOUNTER — Encounter: Payer: Self-pay | Admitting: Internal Medicine

## 2019-05-22 ENCOUNTER — Other Ambulatory Visit: Payer: Self-pay

## 2019-05-22 VITALS — BP 138/84 | HR 70 | Temp 97.7°F | Ht 62.0 in | Wt 166.2 lb

## 2019-05-22 DIAGNOSIS — J3489 Other specified disorders of nose and nasal sinuses: Secondary | ICD-10-CM | POA: Diagnosis not present

## 2019-05-22 DIAGNOSIS — R7301 Impaired fasting glucose: Secondary | ICD-10-CM | POA: Diagnosis not present

## 2019-05-22 DIAGNOSIS — I10 Essential (primary) hypertension: Secondary | ICD-10-CM

## 2019-05-22 LAB — POCT GLYCOSYLATED HEMOGLOBIN (HGB A1C): Hemoglobin A1C: 5.6 % (ref 4.0–5.6)

## 2019-05-22 NOTE — Assessment & Plan Note (Signed)
BP elevated today, recheck is normal. She also gets normal readings at home. We talked about initial white coat hypertension and that with the pandemic this is typically worse with the increased stress about going out of the home right now.

## 2019-05-22 NOTE — Progress Notes (Signed)
   Subjective:   Patient ID: Raven Haynes, female    DOB: September 25, 1950, 69 y.o.   MRN: [id number]  HPI  The patient is a 69 YO female coming in for follow up of her sugars. We have been monitoring them for some time and they were increased at last visit. She has made significant changes to diet with less carbohydrates. Stable amount of exercise. She has gotten both doses of covid-19 vaccine and did well with this. Has a spot on her nose present since 2013 but growing slightly in recent years she would like removed. She also has noticed that BP is usually high at dentist or doctor office but normal at home. She checks a couple times a week and usually 130-140/80s. Denies headaches or chest pains.  Review of Systems  Constitutional: Negative.   HENT: Negative.   Eyes: Negative.   Respiratory: Negative for cough, chest tightness and shortness of breath.   Cardiovascular: Negative for chest pain, palpitations and leg swelling.  Gastrointestinal: Negative for abdominal distention, abdominal pain, constipation, diarrhea, nausea and vomiting.  Musculoskeletal: Negative.   Skin: Negative.        Lesion on nose  Neurological: Negative.   Psychiatric/Behavioral: Negative.     Objective:  Physical Exam Constitutional:      Appearance: She is well-developed.  HENT:     Head: Normocephalic and atraumatic.     Nose:     Comments: Skin tone lesion on the nose, no rolled borders, not painful Cardiovascular:     Rate and Rhythm: Normal rate and regular rhythm.  Pulmonary:     Effort: Pulmonary effort is normal. No respiratory distress.     Breath sounds: Normal breath sounds. No wheezing or rales.  Abdominal:     General: Bowel sounds are normal. There is no distension.     Palpations: Abdomen is soft.     Tenderness: There is no abdominal tenderness. There is no rebound.  Musculoskeletal:     Cervical back: Normal range of motion.  Skin:    General: Skin is warm and dry.  Neurological:   Mental Status: She is alert and oriented to person, place, and time.     Coordination: Coordination normal.     Vitals:   05/22/19 0859 05/22/19 0929  BP: (!) 162/98 138/84  Pulse: 70   Temp: 97.7 F (36.5 C)   TempSrc: Oral   SpO2: 99%   Weight: 166 lb 3.2 oz (75.4 kg)   Height: 5\' 2"  (1.575 m)     This visit occurred during the SARS-CoV-2 public health emergency.  Safety protocols were in place, including screening questions prior to the visit, additional usage of staff PPE, and extensive cleaning of exam room while observing appropriate contact time as indicated for disinfecting solutions.   Assessment & Plan:

## 2019-05-22 NOTE — Assessment & Plan Note (Signed)
POC HgA1c 5.6 which is much improved. Will go back to yearly monitoring. We discussed with her family history of diabetes she is at higher risk and needs to be careful about carbohydrate intake.

## 2019-05-22 NOTE — Patient Instructions (Signed)
We will get you in with a dermatologist for the nose.  The HgA1c is 5.6 today which is much better. Keep up the good work with less carbohydrates!

## 2019-05-22 NOTE — Assessment & Plan Note (Signed)
Referral to dermatology for removal as this is growing.

## 2019-07-15 ENCOUNTER — Other Ambulatory Visit: Payer: Self-pay

## 2019-07-15 ENCOUNTER — Ambulatory Visit: Payer: Medicare HMO | Admitting: Physician Assistant

## 2019-07-15 ENCOUNTER — Encounter: Payer: Self-pay | Admitting: Physician Assistant

## 2019-07-15 DIAGNOSIS — D480 Neoplasm of uncertain behavior of bone and articular cartilage: Secondary | ICD-10-CM

## 2019-07-15 NOTE — Progress Notes (Signed)
   New Patient Visit  Subjective  Raven Haynes is a 69 y.o. female who presents for the following: Skin Problem (lump on nose x 8 year- has grown some- cyst per PCP).  The bump is on the bridge of her nose and started very small about 10 years ago. It has grown very slowly. She did have plastic surgery on her nose to clear out and reshape her sinuses 50 years ago. This bump is never sore itself but she on occasion has a tight sensation over the bridge of her nose.   Objective  Well appearing patient in no apparent distress; mood and affect are within normal limits.  Face examined. Relevant physical exam findings are noted in the Assessment and Plan. No suspicious moles noted on back.  Objective  Dorsum of Nose: Firm nodule palpated along nasal bone possibly and junction with cartilage. This is firm and nonmobile. It is not in the skin.  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 This may need surgery to determine what it is. Referral to [facility]

## 2019-09-18 DIAGNOSIS — M950 Acquired deformity of nose: Secondary | ICD-10-CM | POA: Diagnosis not present

## 2019-10-06 DIAGNOSIS — E663 Overweight: Secondary | ICD-10-CM | POA: Diagnosis not present

## 2019-10-06 DIAGNOSIS — Z008 Encounter for other general examination: Secondary | ICD-10-CM | POA: Diagnosis not present

## 2019-10-06 DIAGNOSIS — I10 Essential (primary) hypertension: Secondary | ICD-10-CM | POA: Diagnosis not present

## 2019-10-06 DIAGNOSIS — Z833 Family history of diabetes mellitus: Secondary | ICD-10-CM | POA: Diagnosis not present

## 2019-10-06 DIAGNOSIS — Z6828 Body mass index (BMI) 28.0-28.9, adult: Secondary | ICD-10-CM | POA: Diagnosis not present

## 2019-10-07 ENCOUNTER — Other Ambulatory Visit: Payer: Self-pay | Admitting: Internal Medicine

## 2019-10-07 DIAGNOSIS — Z1231 Encounter for screening mammogram for malignant neoplasm of breast: Secondary | ICD-10-CM

## 2019-10-28 ENCOUNTER — Ambulatory Visit
Admission: RE | Admit: 2019-10-28 | Discharge: 2019-10-28 | Disposition: A | Discharge status: Home / Self Care | Payer: Medicare HMO | Source: Ambulatory Visit | Attending: Internal Medicine | Admitting: Internal Medicine

## 2019-10-28 ENCOUNTER — Other Ambulatory Visit: Payer: Self-pay

## 2019-10-28 DIAGNOSIS — Z1231 Encounter for screening mammogram for malignant neoplasm of breast: Secondary | ICD-10-CM

## 2019-11-04 ENCOUNTER — Ambulatory Visit (INDEPENDENT_AMBULATORY_CARE_PROVIDER_SITE_OTHER): Payer: Medicare HMO

## 2019-11-04 ENCOUNTER — Ambulatory Visit (INDEPENDENT_AMBULATORY_CARE_PROVIDER_SITE_OTHER): Payer: Medicare HMO | Admitting: Family

## 2019-11-04 ENCOUNTER — Encounter: Payer: Self-pay | Admitting: Family

## 2019-11-04 ENCOUNTER — Other Ambulatory Visit: Payer: Self-pay

## 2019-11-04 VITALS — BP 140/80 | HR 73 | Temp 97.6°F | Ht 62.0 in | Wt 167.0 lb

## 2019-11-04 DIAGNOSIS — M25561 Pain in right knee: Secondary | ICD-10-CM

## 2019-11-04 DIAGNOSIS — I10 Essential (primary) hypertension: Secondary | ICD-10-CM

## 2019-11-04 MED ORDER — MELOXICAM 15 MG PO TABS: 15.0000 mg | ORAL_TABLET | Freq: Every day | ORAL | 0 refills | Status: DC

## 2019-11-04 NOTE — Patient Instructions (Signed)
If you purchase a new cuff, please consider OMRON arm cuff;

## 2019-11-04 NOTE — Progress Notes (Signed)
Raven Haynes is a 69 y.o. female with the following history as recorded in EpicCare:  Patient Active Problem List   Diagnosis Date Noted  . Lesion of nose 05/22/2019  . Impaired fasting blood sugar 12/19/2018  . Routine general medical examination at a health care facility 11/15/2015  . Essential hypertension 03/22/2015    Current Outpatient Medications  Medication Sig Dispense Refill  . B Complex-C (SUPER B COMPLEX PO) Take by mouth.    . Calcium Citrate-Vitamin D (CITRACAL + D PO) Take by mouth.    . Cholecalciferol (D3-1000 PO) Take by mouth.    . Cyanocobalamin (VITAMIN B 12 PO) Take by mouth.    . hydrochlorothiazide (HYDRODIURIL) 25 MG tablet TAKE ONE TABLET BY MOUTH ONE TIME DAILY  90 tablet 3  . Magnesium 400 MG CAPS Take by mouth.    . Red Yeast Rice Extract (RED YEAST RICE PO) Take by mouth.    . Zinc Sulfate (ZINC 15 PO) Take by mouth.    . meloxicam (MOBIC) 15 MG tablet Take 1 tablet (15 mg total) by mouth daily. 30 tablet 0   No current facility-administered medications for this visit.    Allergies: Patient has no known allergies.  Past Medical History:  Diagnosis Date  . Hypertension     Past Surgical History:  Procedure Laterality Date  . KNEE SURGERY Bilateral 2004,2009    Family History  Problem Relation Age of Onset  . Diabetes Mother   . Breast cancer Neg Hx     Social History   Tobacco Use  . Smoking status: Never Smoker  . Smokeless tobacco: Never Used  Substance Use Topics  . Alcohol use: Yes    Comment: soclally    Subjective:  Right knee pain x 2-3 weeks; seemed to start after recent exercise work-out- felt like the weight was too heavy; right knee felt "very stiff."  Using 200 mg daily with limited benefit; pain is most noticeable with bending the knee;   Also concerned that her blood pressure is not well controlled; "seems to be fluctuating." Admits that her cuff may not be accurate; both she and her husband have been getting very odd  readings; does not have her cuff with her today for comparison; Denies any chest pain, shortness of breath, blurred vision or headache   Objective:  Vitals:   11/04/19 0847  BP: 140/80  Pulse: 73  Temp: 97.6 F (36.4 C)  SpO2: 98%  Weight: 167 lb (75.8 kg)  Height: 5\' 2"  (1.575 m)    General: Well developed, well nourished, in no acute distress  Head: Normocephalic and atraumatic  Lungs: Respirations unlabored; clear to auscultation bilaterally without wheeze, rales, rhonchi  CVS exam: normal rate and regular rhythm.  Abdomen: Soft; nontender; nondistended; normoactive bowel sounds; no masses or hepatosplenomegaly  Musculoskeletal: No deformities; no active joint inflammation  Extremities: No edema, cyanosis, clubbing  Vessels: Symmetric bilaterally  Neurologic: Alert and oriented; speech intact; face symmetrical; moves all extremities well; CNII-XII intact without focal deficit   Assessment:  1. Acute pain of right knee   2. Essential hypertension     Plan:  1. Update right knee X-ray; Rx for Mobic 15 mg daily; may need to refer to sports medicine; 2. Known white coat hypertension; ? Control; discussed having her purchase a new cuff and start monitoring; bring her cuff and log to the next OV; she will plan to see her PCP in 2-3 months, sooner if pressure remains elevated.  This visit occurred during the SARS-CoV-2 public health emergency.  Safety protocols were in place, including screening questions prior to the visit, additional usage of staff PPE, and extensive cleaning of exam room while observing appropriate contact time as indicated for disinfecting solutions.     No follow-ups on file.  Orders Placed This Encounter  Procedures  . DG Knee Complete 4 Views Right    Standing Status:   Future    Number of Occurrences:   1    Standing Expiration Date:   11/03/2020    Order Specific Question:   Reason for Exam (SYMPTOM  OR DIAGNOSIS REQUIRED)    Answer:   right knee pain     Order Specific Question:   Preferred imaging location?    Answer:   [facility]    Order Specific Question:   Radiology Contrast Protocol - do NOT remove file path    Answer:   [email or web address]    Requested Prescriptions   Signed Prescriptions Disp Refills  . meloxicam (MOBIC) 15 MG tablet 30 tablet 0    Sig: Take 1 tablet (15 mg total) by mouth daily.

## 2019-12-03 ENCOUNTER — Other Ambulatory Visit: Payer: Self-pay

## 2019-12-03 DIAGNOSIS — M25561 Pain in right knee: Secondary | ICD-10-CM | POA: Insufficient documentation

## 2019-12-03 NOTE — Progress Notes (Signed)
Subjective:    Patient ID: Raven Haynes, female    DOB: [birth year], 69 y.o.   MRN: [id number]  HPI The patient is here for an acute visit.   Right knee pain:  She injured her knee doing leg extension exercises.  He felt tension in her knee and then had pain.  Her knee then locked and she could not bend it well.  She was seen by Mickel Baas and had an x-ray and was prescribed meloxicam.  Her pain is gone, but she can not walk like before.  She still has difficulty bending her knee.   She had knee surgery in the past - obth knees - clean up of arthritis.  She did do the elliptical in her knee felt better afterwards.   BP issues:  BP is irregular at home 135-150+, it is usually higher here.  She is taking her medication daily as prescribed, but was concerned about why it was irregular and sometimes high.    Medications and allergies reviewed with patient and updated if appropriate.  Patient Active Problem List   Diagnosis Date Noted  . Right knee pain 12/03/2019  . Lesion of nose 05/22/2019  . Impaired fasting blood sugar 12/19/2018  . Routine general medical examination at a health care facility 11/15/2015  . Essential hypertension 03/22/2015    Current Outpatient Medications on File Prior to Visit  Medication Sig Dispense Refill  . B Complex-C (SUPER B COMPLEX PO) Take by mouth.    . Calcium Citrate-Vitamin D (CITRACAL + D PO) Take by mouth.    . Cholecalciferol (D3-1000 PO) Take by mouth.    . Cyanocobalamin (VITAMIN B 12 PO) Take by mouth.    . hydrochlorothiazide (HYDRODIURIL) 25 MG tablet TAKE ONE TABLET BY MOUTH ONE TIME DAILY  90 tablet 3  . Magnesium 400 MG CAPS Take by mouth.    . meloxicam (MOBIC) 15 MG tablet Take 1 tablet (15 mg total) by mouth daily. 30 tablet 0  . Red Yeast Rice Extract (RED YEAST RICE PO) Take by mouth.    . Zinc Sulfate (ZINC 15 PO) Take by mouth.     No current facility-administered medications on file prior to visit.    Past Medical History:    Diagnosis Date  . Hypertension     Past Surgical History:  Procedure Laterality Date  . KNEE SURGERY Bilateral 2004,2009    Social History   Socioeconomic History  . Marital status: Married    Spouse name: Not on file  . Number of children: 2  . Years of education: Not on file  . Highest education level: Not on file  Occupational History  . 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 Smoking status: Never Smoker  . Smokeless tobacco: Never Used  Vaping Use  . Vaping Use: Never used  Substance and Sexual Activity  . Alcohol use: Yes    Comment: soclally  . Drug use: No  . Sexual activity: Yes  Other Topics Concern  . Not on file  Social History Narrative  . Not on file   Social Determinants of Health   Financial Resource Strain:   . Difficulty of Paying Living Expenses: Not on file  Food Insecurity:   . Worried About Charity fundraiser in the Last Year: Not on file  . Ran Out of Food in the Last Year: Not on file  Transportation Needs:   . Lack of  Transportation (Medical): Not on file  . Lack of Transportation (Non-Medical): Not on file  Physical Activity: Unknown  . Days of Exercise per Week: 3 days  . Minutes of Exercise per Session: Not on file  Stress:   . Feeling of Stress : Not on file  Social Connections:   . Frequency of Communication with Friends and Family: Not on file  . Frequency of Social Gatherings with Friends and Family: Not on file  . Attends Religious Services: Not on file  . Active Member of Clubs or Organizations: Not on file  . Attends Archivist Meetings: Not on file  . Marital Status: Not on file    Family History  Problem Relation Age of Onset  . Diabetes Mother   . Breast cancer Neg Hx     Review of Systems  Constitutional: Negative for fever.  Cardiovascular: Positive for palpitations (with anxiety only) and leg swelling (mild). Negative for chest pain.   Musculoskeletal: Positive for arthralgias and joint swelling.  Neurological: Negative for dizziness, light-headedness and headaches.       Objective:   Vitals:   12/04/19 0945  BP: (!) 142/90  Pulse: 63  Temp: 98.4 F (36.9 C)  SpO2: 96%   BP Readings from Last 3 Encounters:  12/04/19 (!) 142/90  11/04/19 140/80  05/22/19 138/84   Wt Readings from Last 3 Encounters:  12/04/19 164 lb 3.2 oz (74.5 kg)  11/04/19 167 lb (75.8 kg)  05/22/19 166 lb 3.2 oz (75.4 kg)   Body mass index is 30.03 kg/m.   Physical Exam    Constitutional: Appears well-developed and well-nourished. No distress.  Head: Normocephalic and atraumatic.  Neck: Neck supple. No tracheal deviation present. No thyromegaly present.  No cervical lymphadenopathy Cardiovascular: Normal rate, regular rhythm and normal heart sounds.  No murmur heard. No carotid bruit .  No edema Pulmonary/Chest: Effort normal and breath sounds normal. No respiratory distress. No has no wheezes. No rales.  Skin: Skin is warm and dry. Not diaphoretic.  Psychiatric: Normal mood and affect. Behavior is normal.       Assessment & Plan:    See Problem List for Assessment and Plan of chronic medical problems.    This visit occurred during the SARS-CoV-2 public health emergency.  Safety protocols were in place, including screening questions prior to the visit, additional usage of staff PPE, and extensive cleaning of exam room while observing appropriate contact time as indicated for disinfecting solutions.

## 2019-12-04 ENCOUNTER — Encounter: Payer: Self-pay | Admitting: Internal Medicine

## 2019-12-04 ENCOUNTER — Ambulatory Visit (INDEPENDENT_AMBULATORY_CARE_PROVIDER_SITE_OTHER): Payer: Medicare HMO | Admitting: Internal Medicine

## 2019-12-04 ENCOUNTER — Telehealth: Payer: Self-pay | Admitting: Internal Medicine

## 2019-12-04 VITALS — BP 142/90 | HR 63 | Temp 98.4°F | Wt 164.2 lb

## 2019-12-04 DIAGNOSIS — Z23 Encounter for immunization: Secondary | ICD-10-CM

## 2019-12-04 DIAGNOSIS — I10 Essential (primary) hypertension: Secondary | ICD-10-CM | POA: Diagnosis not present

## 2019-12-04 DIAGNOSIS — M25561 Pain in right knee: Secondary | ICD-10-CM

## 2019-12-04 MED ORDER — MELOXICAM 15 MG PO TABS
15.0000 mg | ORAL_TABLET | Freq: Every day | ORAL | 0 refills | Status: DC
Start: 2019-12-04 — End: 2019-12-21

## 2019-12-04 MED ORDER — LOSARTAN POTASSIUM 25 MG PO TABS: 25.0000 mg | ORAL_TABLET | Freq: Every day | ORAL | 1 refills | Status: DC

## 2019-12-04 MED ORDER — HYDROCHLOROTHIAZIDE 25 MG PO TABS
25.0000 mg | ORAL_TABLET | Freq: Every day | ORAL | 1 refills | Status: DC
Start: 2019-12-04 — End: 2020-11-25

## 2019-12-04 NOTE — Telephone Encounter (Signed)
Patient is thinking of transferring her care. After starting the note, patient wants to think on it some more. She will call back to follow up.

## 2019-12-04 NOTE — Assessment & Plan Note (Signed)
Subacute Discussed x-ray showing arthritis Discussed activities to continue with and what ideally to avoid Discussed icing Discussed that she can take the meloxicam as needed, but should avoid it on a regular basis if it is not needed Okay to take Tylenol arthritis Meloxicam refilled At this time she does not feel that she needs a referral for sports med/orthopedics

## 2019-12-04 NOTE — Patient Instructions (Addendum)
Start 81 mg of aspirin daily.   Stat losartan 25 mg daily for your blood pressure.   Continue the hydrochlorothiazide daily.    Limit your sugar and salt.     Flu immunization administered today.

## 2019-12-04 NOTE — Assessment & Plan Note (Signed)
Chronic Not ideally controlled Continue hydrochlorothiazide 25 mg daily We will add losartan 25 mg daily Advised her to continue to monitor blood pressure at home Low-sodium diet, regular exercise discussed BMP in 1-2 weeks

## 2019-12-14 ENCOUNTER — Emergency Department (HOSPITAL_COMMUNITY)
Admission: EM | Admit: 2019-12-14 | Discharge: 2019-12-15 | Disposition: A | Discharge status: Home / Self Care | Payer: Medicare HMO | Attending: Emergency Medicine | Admitting: Emergency Medicine

## 2019-12-14 ENCOUNTER — Telehealth: Payer: Self-pay | Admitting: Internal Medicine

## 2019-12-14 ENCOUNTER — Encounter (HOSPITAL_COMMUNITY): Payer: Self-pay

## 2019-12-14 ENCOUNTER — Other Ambulatory Visit: Payer: Self-pay

## 2019-12-14 DIAGNOSIS — Z79899 Other long term (current) drug therapy: Secondary | ICD-10-CM | POA: Diagnosis not present

## 2019-12-14 DIAGNOSIS — M2550 Pain in unspecified joint: Secondary | ICD-10-CM | POA: Insufficient documentation

## 2019-12-14 DIAGNOSIS — R0789 Other chest pain: Secondary | ICD-10-CM | POA: Diagnosis not present

## 2019-12-14 DIAGNOSIS — I10 Essential (primary) hypertension: Secondary | ICD-10-CM | POA: Insufficient documentation

## 2019-12-14 DIAGNOSIS — R5383 Other fatigue: Secondary | ICD-10-CM | POA: Insufficient documentation

## 2019-12-14 DIAGNOSIS — M25531 Pain in right wrist: Secondary | ICD-10-CM | POA: Diagnosis not present

## 2019-12-14 DIAGNOSIS — R079 Chest pain, unspecified: Secondary | ICD-10-CM | POA: Diagnosis present

## 2019-12-14 DIAGNOSIS — M7989 Other specified soft tissue disorders: Secondary | ICD-10-CM | POA: Diagnosis not present

## 2019-12-14 LAB — URINALYSIS, ROUTINE W REFLEX MICROSCOPIC
Bacteria, UA: NONE SEEN
Bilirubin Urine: NEGATIVE
Glucose, UA: NEGATIVE mg/dL
Hgb urine dipstick: NEGATIVE
Ketones, ur: NEGATIVE mg/dL
Nitrite: NEGATIVE
Protein, ur: NEGATIVE mg/dL
Specific Gravity, Urine: 1.014 (ref 1.005–1.030)
pH: 5 (ref 5.0–8.0)

## 2019-12-14 LAB — BASIC METABOLIC PANEL
Anion gap: 10 (ref 5–15)
BUN: 14 mg/dL (ref 8–23)
CO2: 26 mmol/L (ref 22–32)
Calcium: 9.6 mg/dL (ref 8.9–10.3)
Chloride: 101 mmol/L (ref 98–111)
Creatinine, Ser: 0.7 mg/dL (ref 0.44–1.00)
GFR calc Af Amer: >60 mL/min (ref >60)
GFR calc non Af Amer: >60 mL/min (ref >60)
Glucose, Bld: 111 mg/dL — ABNORMAL HIGH (ref 70–99)
Potassium: 3.8 mmol/L (ref 3.5–5.1)
Sodium: 137 mmol/L (ref 135–145)

## 2019-12-14 LAB — CBC
HCT: 39.2 % (ref 36.0–46.0)
Hemoglobin: 12.7 g/dL (ref 12.0–15.0)
MCH: 28.2 pg (ref 26.0–34.0)
MCHC: 32.4 g/dL (ref 30.0–36.0)
MCV: 86.9 fL (ref 80.0–100.0)
Platelets: 354 10*3/uL (ref 150–400)
RBC: 4.51 MIL/uL (ref 3.87–5.11)
RDW: 12.4 % (ref 11.5–15.5)
WBC: 5.5 10*3/uL (ref 4.0–10.5)
nRBC: 0 % (ref 0.0–0.2)

## 2019-12-14 NOTE — Telephone Encounter (Signed)
Patient's husband called and said that after an appointment on 12/04/2019 for right knee pain and a flu shot, the patients husband states that since then she is having aches and swelling in her joints and that she is also having chest pressure. Transferred to [place] at [facility].

## 2019-12-14 NOTE — Telephone Encounter (Signed)
[place] with [facility] called and said that the pt refused to call 911 or go to the ED. Please call pt back at [phone].

## 2019-12-14 NOTE — ED Triage Notes (Addendum)
Pt presents with joint pain, high BP and fatigue starting in august. Seen at her PCP for the same, they started her on some medication for HTN and joint pain and a flu shot. Pt reports increase pain/swelling after receiving flu shot. Swelling to BLE/BUE. Pt with multiple complaints. Pt reports she was started on Meloxicam, swelling is listed as one of the side effects

## 2019-12-14 NOTE — Telephone Encounter (Signed)
Pt states she has been having increased joint pain since her visit on 9/24, however, admits that she has not take the Meloxicam that was prescribed, has only taken tylenol.  Also c/o inability to lift left arm since "a couple of days ago" & believes it is a result of her flu shot; states b/c of this she is unable to lift herself from a seated/lieing position. Endorses chest pressure without SOB since this morning that continues; BP this am 159/92.  Discussed that per team health nurse pt was advised to go to ED to r/o heart attack.  Pt verb understanding, states she will go to ED & keep provider updated re: joint pain.

## 2019-12-15 ENCOUNTER — Emergency Department (HOSPITAL_COMMUNITY): Payer: Medicare HMO

## 2019-12-15 DIAGNOSIS — I10 Essential (primary) hypertension: Secondary | ICD-10-CM | POA: Diagnosis not present

## 2019-12-15 DIAGNOSIS — M25531 Pain in right wrist: Secondary | ICD-10-CM | POA: Diagnosis not present

## 2019-12-15 DIAGNOSIS — M7989 Other specified soft tissue disorders: Secondary | ICD-10-CM | POA: Diagnosis not present

## 2019-12-15 LAB — SEDIMENTATION RATE: Sed Rate: 37 mm/hr — ABNORMAL HIGH (ref 0–22)

## 2019-12-15 LAB — TROPONIN I (HIGH SENSITIVITY): Troponin I (High Sensitivity): 3 ng/L (ref <18)

## 2019-12-15 LAB — C-REACTIVE PROTEIN: CRP: 1.4 mg/dL — ABNORMAL HIGH (ref <1.0)

## 2019-12-15 NOTE — Discharge Instructions (Addendum)
Thank you for allowing me to care for you today in the Emergency Department.   You were seen today for chest pain.  As we discussed, your EKG, troponin, and labs regarding your chest pain were reassuring.  You can take 650 mg of Tylenol or 600 mg of ibuprofen with food once every 6 hours for pain.  Do not take more than 4000 mg of Tylenol in a 24-hour period.  Continue to check your blood pressure at home as recommended by primary care.  Your blood pressure was elevated today, but has gradually improved during your visit.  I did send a couple of inflammatory markers since you have been having pain in multiple joints.  These test are pending.  You can follow-up on the results with primary care.  As we discussed, it may take some time to figure out why you have been having pain in multiple joints and worsening fatigue.  You should return to the emergency department if you develop significant difficulty breathing, high fevers, if your joints get very red and hot to the touch, if you develop chest pain or headache with a very elevated blood pressure, or other new, concerning symptoms.

## 2019-12-15 NOTE — ED Provider Notes (Addendum)
[facility] EM[facility]ENC[facility] DEPARTMENT Provider Note   CSN: [id number] Arrival date & time: 12/14/19  1155     History Chief Complaint  Patient presents with  . Joint Pain  . Fatigue    Raven Haynes is a 69 y.o. female with a history of hypertension who presents to the emergency department with chest pain.  The patient reports that she developed a pressure-like, central chest pain, which she describes as pressure, that began suddenly this morning.  Chest pain resolved while she was waiting in the waiting room. No known aggravating or alleviating factors.  No history of similar.  Denies shortness of breath, orthopnea, palpitations, PND.  She notes that her blood pressure has also been elevated today, 170s over 60-70s.  She reports that her blood pressures have been labile at home over the last few weeks, but typically systolic pressure runs between 140s and 160s.  She has been compliant with her home medications.  No history of cardiovascular disease.  She does note that she has been having swelling in her legs, but this has been associated with arthralgias.  Per chart review, patient was seen by her PCP on August 25 for right knee pain that felt very stiff that seemed to begin after performing knee extensions while exercising.  At the time of evaluation, pain had been ongoing for approximately 2 to 3 weeks.  She was taking Tylenol with no improvement in her symptoms.  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  No other joint surgeries.  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  She was discharged with a prescription of meloxicam, but did not start taking the medication for several weeks and continue to take Tylenol for pain.  She notes that she has had no recent change in her exercise regimen of the last 5 years.  She has been able to continue  to exercise and has noted that her initial right knee pain seemed to improve after she used the elliptical.  However, she then developed pain in her left knee that she thought may be from overcompensating and performing more weightbearing on her left side due to the pain in her right knee.  She had noticed some swelling in her bilateral ankles, which she treated by elevating her legs at night.  Swelling has been coming and going.  She notes that her bilateral ankles were swollen this morning when she came to the ER, but swelling resolved while her legs were dependent in the waiting room for more than 16 hours.  When she was seen by her PCP on 9/24, she received her influenza vaccine and her left upper arm.  Reports that for the next few days that she could barely move her arm due to the intensity of the pain, which she attributed to her flu vaccine.  She reports that she has continued to have pain in her left shoulder, but has since noticed other arthralgias including pain in the joints of her bilateral hands, bilateral wrist, and right shoulder.  Arthralgias are worse in the morning, and she feels very stiff.  Since onset, reports that it takes her a couple of hours before she is able to move around.  When she is up and moving, she reports that her arthralgias are improved.  She also feels as if her hands and wrist have been more swollen in the morning, but notes  that this improves throughout the day.  States that the stiffness and pain has been so severe that she has not been able to make it to the restroom in time to void several times, but denies true urinary or fecal incontinence.  She also notes that she has been feeling very fatigued for several weeks.  She does report that she read the adverse reactions for meloxicam and is concerned that it may be due to this medication.  She was also recently started on 25 mg of losartan daily at her PCP visit on 9/24.   She denies fever, headache, neck pain or  stiffness, cough, abdominal pain, dark urine, muscle spasms, back pain, neck pain, redness or warmth to the joints, vaginal discharge or pain, weakness, or numbness.   She has no concerns for STIs.  No known or suspected tick bites.  She does report that she has had several mosquito bites over the last few months.  No recent international travel.  She does take multiple vitamins and dietary supplements, but reports that the only new supplement in the last 2 months has been turmeric.  The history is provided by the patient, the spouse and medical records. No language interpreter was used.       Past Medical History:  Diagnosis Date  . Hypertension     Patient Active Problem List   Diagnosis Date Noted  . Right knee pain 12/03/2019  . Lesion of nose 05/22/2019  . Impaired fasting blood sugar 12/19/2018  . Routine general medical examination at a health care facility 11/15/2015  . Essential hypertension 03/22/2015    Past Surgical History:  Procedure Laterality Date  . KNEE SURGERY Bilateral 2004,2009     OB History   No obstetric history on file.     Family History  Problem Relation Age of Onset  . Diabetes Mother   . Breast cancer Neg Hx     Social History   Tobacco Use  . Smoking status: Never Smoker  . Smokeless tobacco: Never Used  Vaping Use  . Vaping Use: Never used  Substance Use Topics  . Alcohol use: Yes    Comment: soclally  . Drug use: No    Home Medications Prior to Admission medications   Medication Sig Start Date End Date Taking? Authorizing Provider  B Complex-C (SUPER B COMPLEX PO) Take by mouth.    [provider]  Calcium Citrate-Vitamin D (CITRACAL + D PO) Take by mouth.    [provider]  Cholecalciferol (D3-1000 PO) Take by mouth.    [provider]  Cyanocobalamin (VITAMIN B 12 PO) Take by mouth.    [provider]  hydrochlorothiazide (HYDRODIURIL) 25 MG tablet Take 1 tablet (25 mg total) by mouth  daily. 12/04/19   Binnie Rail, MD  losartan (COZAAR) 25 MG tablet Take 1 tablet (25 mg total) by mouth daily. 12/04/19   Binnie Rail, MD  Magnesium 400 MG CAPS Take by mouth.    [provider]  meloxicam (MOBIC) 15 MG tablet Take 1 tablet (15 mg total) by mouth daily. 12/04/19   Binnie Rail, MD  Red Yeast Rice Extract (RED YEAST RICE PO) Take by mouth.    [provider]  Zinc Sulfate (ZINC 15 PO) Take by mouth.    [provider]    Allergies    Patient has no known allergies.  Review of Systems   Review of Systems  Constitutional: Positive for fatigue. Negative for activity  change, chills, diaphoresis, fever and unexpected weight change.  HENT: Negative for congestion and sore throat.   Eyes: Negative for visual disturbance.  Respiratory: Negative for cough, shortness of breath and wheezing.   Cardiovascular: Positive for chest pain and leg swelling. Negative for palpitations.  Gastrointestinal: Negative for abdominal pain, diarrhea, nausea and vomiting.  Endocrine: Negative for polyuria.  Genitourinary: Negative for difficulty urinating, dysuria and pelvic pain.  Musculoskeletal: Positive for arthralgias, gait problem, joint swelling and myalgias. Negative for back pain, neck pain and neck stiffness.  Skin: Negative for color change, rash and wound.  Allergic/Immunologic: Negative for immunocompromised state.  Neurological: Negative for dizziness, seizures, syncope, weakness, light-headedness, numbness and headaches.  Psychiatric/Behavioral: Negative for confusion.    Physical Exam Updated Vital Signs BP (!) 154/71 (BP Location: Left Arm)   Pulse (!) 51   Temp 98 F (36.7 C) (Oral)   Resp 14   Ht '5\' 2"'  (1.575 m)   Wt 73 kg   SpO2 100%   BMI 29.45 kg/m   Physical Exam Vitals and nursing note reviewed.  Constitutional:      General: She is not in acute distress.    Appearance: Normal appearance. She is not ill-appearing, toxic-appearing  or diaphoretic.  HENT:     Head: Normocephalic.     Nose: Nose normal.     Mouth/Throat:     Mouth: Mucous membranes are moist.     Pharynx: No oropharyngeal exudate or posterior oropharyngeal erythema.  Eyes:     Conjunctiva/sclera: Conjunctivae normal.  Neck:     Comments: No meningismus Cardiovascular:     Rate and Rhythm: Normal rate and regular rhythm.     Heart sounds: No murmur heard.  No friction rub. No gallop.   Pulmonary:     Effort: Pulmonary effort is normal. No respiratory distress.     Breath sounds: No stridor. No wheezing, rhonchi or rales.     Comments: Lungs are clear to auscultation bilaterally.  No increased work of breathing. Chest:     Chest wall: No tenderness.  Abdominal:     General: There is no distension.     Palpations: Abdomen is soft. There is no mass.     Tenderness: There is no abdominal tenderness. There is no right CVA tenderness, left CVA tenderness, guarding or rebound.     Hernia: No hernia is present.  Musculoskeletal:        General: Swelling and tenderness present.     Cervical back: Neck supple.     Right lower leg: No edema.     Left lower leg: No edema.     Comments: Tender to palpation over the bilateral patellar tendons.  No medial or lateral joint line tenderness on the bilateral knees.  Increased pain with active and passive range of motion bilaterally.  No erythema or warmth.  No edema.  No focal tenderness to the bilateral ankles or hips.  No redness, warmth, or edema.  She is neurovascularly intact to the bilateral lower extremities.  Tender to palpation over the lateral aspect of the bilateral wrists.  She also has diffuse tenderness to the joints of the bilateral hands.  Mild diffuse tenderness palpation to the bilateral shoulders.  Bilateral elbows are nontender.  She has full active and passive range of motion of all joints of the bilateral upper extremities.  She is neurovascularly intact.  No erythema, warmth, or edema  throughout.  Trace peripheral edema of the bilateral lower extremities.  Skin:    General: Skin is warm.     Coloration: Skin is not jaundiced or pale.     Findings: No rash.  Neurological:     General: No focal deficit present.     Mental Status: She is alert.  Psychiatric:        Behavior: Behavior normal.     ED Results / Procedures / Treatments   Labs (all labs ordered are listed, but only abnormal results are displayed) Labs Reviewed  BASIC METABOLIC PANEL - Abnormal; Notable for the following components:      Result Value   Glucose, Bld 111 (*)    All other components within normal limits  URINALYSIS, ROUTINE W REFLEX MICROSCOPIC - Abnormal; Notable for the following components:   Leukocytes,Ua TRACE (*)    All other components within normal limits  C-REACTIVE PROTEIN - Abnormal; Notable for the following components:   CRP 1.4 (*)    All other components within normal limits  SEDIMENTATION RATE - Abnormal; Notable for the following components:   Sed Rate 37 (*)    All other components within normal limits  CBC  CBG MONITORING, ED  TROPONIN I (HIGH SENSITIVITY)  TROPONIN I (HIGH SENSITIVITY)    EKG EKG Interpretation  Date/Time:  Monday December 14 2019 13:13:31 EDT Ventricular Rate:  85 PR Interval:  176 QRS Duration: 72 QT Interval:  358 QTC Calculation: 426 R Axis:   54 Text Interpretation: Normal sinus rhythm Cannot rule out Anterior infarct , age undetermined Abnormal ECG No old tracing to compare Confirmed by Deno Etienne [phone]) on 12/15/2019 2:53:58 AM   Radiology DG Chest 2 View  Result Date: 12/15/2019 CLINICAL DATA:  High blood pressure.  Wrist pain. EXAM: CHEST - 2 VIEW COMPARISON:  None. FINDINGS: Artifact from EKG leads. Normal heart size. Mild aortic tortuosity. No acute infiltrate or edema. No effusion or pneumothorax. No acute osseous findings. IMPRESSION: No evidence of acute disease. Electronically Signed   By: Monte Fantasia M.D.   On:  12/15/2019 04:56   DG Wrist Complete Right  Result Date: 12/15/2019 CLINICAL DATA:  Right wrist pain after receiving flu shot. EXAM: RIGHT WRIST - COMPLETE 3+ VIEW COMPARISON:  None. FINDINGS: Medial soft tissue swelling suspected. No fracture, erosion, or subluxation. Generalized osteopenia. IMPRESSION: Soft tissue swelling without underlying acute or erosive osseous finding. Electronically Signed   By: Monte Fantasia M.D.   On: 12/15/2019 04:55    Procedures Procedures (including critical care time)  Medications Ordered in ED Medications - No data to display  ED Course  I have reviewed the triage vital signs and the nursing notes.  Pertinent labs & imaging results that were available during my care of the patient were reviewed by me and considered in my medical decision making (see chart for details).    MDM Rules/Calculators/A&P                          69 year old female with history of hypertension who presents to the emergency department with chest pain, onset today.  She notably has been having polyarthralgias and fatigue for more than 6 weeks.  Hypertensive in the 170s over 80s for most of her ER visit.  No tachypnea, tachycardia.  She is not hypoxic and is afebrile.  EKG with normal sinus rhythm.  Chest x-ray has been reviewed by me and is unremarkable.  She has no metabolic derangements.  CBC is normal.  Initially, troponin was not  obtained when she arrived to the ER.  On my evaluation of the patient, she reported that chest pain had resolved while she was in the waiting room.  Patient was in the waiting room for more than 16 hours due to boarding and COVID-19 pandemic.  Initial troponin was not elevated.  Since onset was more than 3 hours ago and HP risk score is less than 4, repeat troponin is not indicated.  Regarding polyarthralgias, initially started in her right knee.  She had an outpatient x-ray that demonstrated moderate to severe tricompartmental osteoarthritis.  She  then developed left knee pain, which could be attributed to gait abnormality from trying not to bear weight on her right lower extremity.  However, she has since developed polyarthralgias in her bilateral upper extremities, including the bilateral wrist and joints of the bilateral hands.  She does also note that she is very stiff when she awakens in the morning and takes more than an hour before she is able to get up and moving.  Could consider rheumatoid arthritis.  This could be related to your recent medication adjustments as well since she was just started on losartan and meloxicam.  I have a low suspicion for disseminated gonorrhea.  Chest x-ray was notably unremarkable did not show any evidence of nodules or adenopathy concerning for SLE or sarcoidosis.  However, she could have musculoskeletal findings without evidence of pulmonary findings.  Did consider infectious arthritis including Lyme disease or [place] spotted fever versus viral etiology.  However, she adamantly denies recent tick bites.  I have a low suspicion for Lyme disease and [place] spotted fever given the length of time her symptoms have been ongoing.  Doubt gout or septic joint.    The patient was seen and evaluated by Dr. Tyrone Nine, attending physician.  We will add on CRP and ESR, which are pending.  Had a lengthy shared decision-making conversation with the patient and her husband.  Discussed that her work-up for chest pain was reassuring and doubt ACS, aortic dissection, tension pneumothorax, pericarditis, myocarditis.  Recommended close outpatient follow-up regarding polyarthralgias and fatigue.  All questions answered.  She is hemodynamically stable and in no acute distress.  Safe for discharge to home with outpatient follow-up as indicated.   Final Clinical Impression(s) / ED Diagnoses Final diagnoses:  Atypical chest pain  Polyarthralgia  Fatigue, unspecified type    Rx / DC Orders ED Discharge Orders    None         Charmika Macdonnell A, PA-C 12/15/19 0631    Karne Ozga A, PA-C 12/15/19 [place], [place], DO 12/15/19 2303

## 2019-12-18 ENCOUNTER — Other Ambulatory Visit (INDEPENDENT_AMBULATORY_CARE_PROVIDER_SITE_OTHER): Payer: Medicare HMO

## 2019-12-18 DIAGNOSIS — I10 Essential (primary) hypertension: Secondary | ICD-10-CM

## 2019-12-18 LAB — BASIC METABOLIC PANEL
BUN: 15 mg/dL (ref 6–23)
CO2: 29 mEq/L (ref 19–32)
Calcium: 9.8 mg/dL (ref 8.4–10.5)
Chloride: 101 mEq/L (ref 96–112)
Creatinine, Ser: 0.77 mg/dL (ref 0.40–1.20)
GFR: 77.93 mL/min (ref >60.00)
Glucose, Bld: 102 mg/dL — ABNORMAL HIGH (ref 70–99)
Potassium: 3.8 mEq/L (ref 3.5–5.1)
Sodium: 138 mEq/L (ref 135–145)

## 2019-12-20 DIAGNOSIS — M2550 Pain in unspecified joint: Secondary | ICD-10-CM | POA: Insufficient documentation

## 2019-12-20 NOTE — Progress Notes (Signed)
Subjective:    Patient ID: Raven Haynes, female    DOB: 11/03/1950, 69 y.o.   MRN: [id number]  HPI The patient is here for follow up of her BP and from the hospital.   She was here 9/24 and I added losartan 25 mg daily for her uncontrolled BP.   She went to the ED 10/4 for chest pain.  The chest pain resolved in the waiting room of the ED.  Also reported joint pain - pain in am lasting a couple of hours before she can get moving. Her hands and wrists have been swollen in the am.  She states her stiffness and pain has been severe and she can not make it to the bathroom in time. ACS ruled out.  ESR and CRP were elevated.    Before she was here she only had right knee pain.  Just after she was here - her right ankle swelled/pain and then several joints were swollen and pain - both, ankles, both knees, left arm from flu shot, b/l wrists and hands and right shoulder  Since she was here she has only had tylenol  She has morning stiffness > 1 hr - she is unable     Medications and allergies reviewed with patient and updated if appropriate.  Patient Active Problem List   Diagnosis Date Noted   Polyarthralgia 12/20/2019   Right knee pain 12/03/2019   Lesion of nose 05/22/2019   Impaired fasting blood sugar 12/19/2018   Routine general medical examination at a health care facility 11/15/2015   Essential hypertension 03/22/2015    Current Outpatient Medications on File Prior to Visit  Medication Sig Dispense Refill   B Complex-C (SUPER B COMPLEX PO) Take by mouth.     Calcium Citrate-Vitamin D (CITRACAL + D PO) Take by mouth.     Cholecalciferol (D3-1000 PO) Take by mouth.     Cyanocobalamin (VITAMIN B 12 PO) Take by mouth.     hydrochlorothiazide (HYDRODIURIL) 25 MG tablet Take 1 tablet (25 mg total) by mouth daily. 90 tablet 1   losartan (COZAAR) 25 MG tablet Take 1 tablet (25 mg total) by mouth daily. 90 tablet 1   Magnesium 400 MG CAPS Take by mouth.     Red Yeast  Rice Extract (RED YEAST RICE PO) Take by mouth.     Zinc Sulfate (ZINC 15 PO) Take by mouth.     meloxicam (MOBIC) 15 MG tablet Take 1 tablet (15 mg total) by mouth daily. (Patient not taking: Reported on 12/21/2019) 30 tablet 0   No current facility-administered medications on file prior to visit.    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  Tobacco Use   Smoking status: Never Smoker   Smokeless tobacco: Never Used  Scientific laboratory technician Use: Never used  Substance and Sexual Activity   Alcohol use: Yes    Comment: soclally   Drug use: No   Sexual activity: Yes  Other Topics Concern   Not on file  Social  History Narrative   Not on file   Social Determinants of Health   Financial Resource Strain:    Difficulty of Paying Living Expenses: Not on file  Food Insecurity:    Worried About [place] in the Last Year: Not on file   Ran Out of Food in the Last Year: Not on file  Transportation Needs:    Lack of Transportation (Medical): Not on file   Lack of Transportation (Non-Medical): Not on file  Physical Activity:    Days of Exercise per Week: Not on file   Minutes of Exercise per Session: Not on file  Stress:    Feeling of Stress : Not on file  Social Connections:    Frequency of Communication with Friends and Family: Not on file   Frequency of Social Gatherings with Friends and Family: Not on file   Attends Religious Services: Not on file   Active Member of Clubs or Organizations: Not on file   Attends Archivist Meetings: Not  on file   Marital Status: Not on file    Family History  Problem Relation Age of Onset   Diabetes Mother    Breast cancer Neg Hx     Review of Systems  Constitutional: Positive for fatigue. Negative for chills and fever.  HENT:       No jaw pain  Eyes: Positive for visual disturbance (little blurry vision when tired).  Respiratory: Negative for cough, shortness of breath and wheezing.   Cardiovascular: Negative for chest pain and palpitations.  Gastrointestinal: Negative for abdominal pain, constipation and diarrhea.  Genitourinary: Negative for dysuria and hematuria.  Musculoskeletal: Positive for arthralgias and joint swelling.  Skin: Negative for rash.  Neurological: Positive for weakness (hands). Negative for dizziness, light-headedness, numbness and headaches.       Objective:   Vitals:   12/21/19 1356  BP: (!) 142/82  Pulse: 60  Temp: 98.1 F (36.7 C)  SpO2: 99%   BP Readings from Last 3 Encounters:  12/21/19 (!) 142/82  12/15/19 (!) 154/71  12/04/19 (!) 142/90   Wt Readings from Last 3 Encounters:  12/21/19 160 lb (72.6 kg)  12/14/19 161 lb (73 kg)  12/04/19 164 lb 3.2 oz (74.5 kg)   Body mass index is 29.26 kg/m.   Physical Exam    Constitutional: Appears well-developed and well-nourished. No distress.  HENT:  Head: Normocephalic and atraumatic.  Neck: Neck supple. No tracheal deviation present. No thyromegaly present.  No cervical lymphadenopathy Cardiovascular: Normal rate, regular rhythm and normal heart sounds.   No murmur heard. No carotid bruit .  No edema Pulmonary/Chest: Effort normal and breath sounds normal. No respiratory distress. No has no wheezes. No rales.  Msk: ankles, knees, wrists and fingers appear swollen.  Finger joints tender to palpation and appear to have some synovitis.  No elbow swelling or pain. Neuro:  Slow to get out of chair and sit down, slow gait - much slower than two weeks ago Skin: Skin is warm and dry. Not  diaphoretic. No rash.  Psychiatric: Normal mood and affect. Behavior is normal.      Assessment & Plan:    See Problem List for Assessment and Plan of chronic medical problems.    This visit occurred during the SARS-CoV-2 public health emergency.  Safety protocols were in place, including screening questions prior to the visit, additional usage of staff PPE, and extensive cleaning of exam room while observing appropriate contact time as indicated for disinfecting  solutions.

## 2019-12-20 NOTE — Patient Instructions (Addendum)
  Blood work was ordered.     Keep taking the tylenol and icing.     Depending on the blood work we will refer you to a specialist.

## 2019-12-21 ENCOUNTER — Other Ambulatory Visit: Payer: Self-pay

## 2019-12-21 ENCOUNTER — Ambulatory Visit (INDEPENDENT_AMBULATORY_CARE_PROVIDER_SITE_OTHER): Payer: Medicare HMO | Admitting: Internal Medicine

## 2019-12-21 ENCOUNTER — Encounter: Payer: Self-pay | Admitting: Internal Medicine

## 2019-12-21 VITALS — BP 142/82 | HR 60 | Temp 98.1°F | Ht 62.0 in | Wt 160.0 lb

## 2019-12-21 DIAGNOSIS — M2550 Pain in unspecified joint: Secondary | ICD-10-CM | POA: Diagnosis not present

## 2019-12-21 DIAGNOSIS — I10 Essential (primary) hypertension: Secondary | ICD-10-CM | POA: Diagnosis not present

## 2019-12-21 LAB — CBC WITH DIFFERENTIAL/PLATELET
Basophils Absolute: 0 10*3/uL (ref 0.0–0.1)
Basophils Relative: 0.7 % (ref 0.0–3.0)
Eosinophils Absolute: 0.1 10*3/uL (ref 0.0–0.7)
Eosinophils Relative: 1.8 % (ref 0.0–5.0)
HCT: 37.2 % (ref 36.0–46.0)
Hemoglobin: 12.1 g/dL (ref 12.0–15.0)
Lymphocytes Relative: 32.1 % (ref 12.0–46.0)
Lymphs Abs: 2.1 10*3/uL (ref 0.7–4.0)
MCHC: 32.6 g/dL (ref 30.0–36.0)
MCV: 85.6 fl (ref 78.0–100.0)
Monocytes Absolute: 0.4 10*3/uL (ref 0.1–1.0)
Monocytes Relative: 6.2 % (ref 3.0–12.0)
Neutro Abs: 3.8 10*3/uL (ref 1.4–7.7)
Neutrophils Relative %: 59.2 % (ref 43.0–77.0)
Platelets: 343 10*3/uL (ref 150.0–400.0)
RBC: 4.35 Mil/uL (ref 3.87–5.11)
RDW: 13.2 % (ref 11.5–15.5)
WBC: 6.4 10*3/uL (ref 4.0–10.5)

## 2019-12-21 LAB — COMPREHENSIVE METABOLIC PANEL
ALT: 14 U/L (ref 0–35)
AST: 17 U/L (ref 0–37)
Albumin: 4.5 g/dL (ref 3.5–5.2)
Alkaline Phosphatase: 65 U/L (ref 39–117)
BUN: 13 mg/dL (ref 6–23)
CO2: 30 mEq/L (ref 19–32)
Calcium: 9.9 mg/dL (ref 8.4–10.5)
Chloride: 100 mEq/L (ref 96–112)
Creatinine, Ser: 0.7 mg/dL (ref 0.40–1.20)
GFR: 87.44 mL/min (ref >60.00)
Glucose, Bld: 105 mg/dL — ABNORMAL HIGH (ref 70–99)
Potassium: 3.7 mEq/L (ref 3.5–5.1)
Sodium: 137 mEq/L (ref 135–145)
Total Bilirubin: 0.5 mg/dL (ref 0.2–1.2)
Total Protein: 8.2 g/dL (ref 6.0–8.3)

## 2019-12-21 LAB — C-REACTIVE PROTEIN: CRP: 1.5 mg/dL (ref 0.5–20.0)

## 2019-12-21 LAB — SEDIMENTATION RATE: Sed Rate: 75 mm/hr — ABNORMAL HIGH (ref 0–30)

## 2019-12-21 NOTE — Assessment & Plan Note (Signed)
Chronic BP well controlled Continue hctz 25 mg daily, losartan 25 mg daily cmp  

## 2019-12-21 NOTE — Assessment & Plan Note (Signed)
New  Started just after her visit her [facility], crp elevated in ED ? Autoimmune, reactive, ? Related to losartan Deferred prednisone Continue tylenol for pain Cbc, cmp, ana, ccp, rf, ana, esr, ccp,ck Will likely need to see rheum

## 2019-12-22 ENCOUNTER — Telehealth: Payer: Self-pay | Admitting: Internal Medicine

## 2019-12-22 NOTE — Telephone Encounter (Signed)
    Please call patient to discuss lab results 

## 2019-12-22 NOTE — Telephone Encounter (Signed)
Three tests are still pending - waiting for those results

## 2019-12-25 LAB — ANTI-NUCLEAR AB-TITER (ANA TITER)
ANA TITER: 1:40 {titer} — ABNORMAL HIGH
ANA Titer 1: 1:80 {titer} — ABNORMAL HIGH

## 2019-12-25 LAB — RHEUMATOID FACTOR: Rheumatoid fact SerPl-aCnc: 75 IU/mL — ABNORMAL HIGH (ref <14)

## 2019-12-25 LAB — CYCLIC CITRUL PEPTIDE ANTIBODY, IGG: Cyclic Citrullin Peptide Ab: >250 UNITS — ABNORMAL HIGH

## 2019-12-25 LAB — ANA: Anti Nuclear Antibody (ANA): POSITIVE — AB

## 2019-12-25 NOTE — Telephone Encounter (Signed)
Some of her labs are still pending.   I would like to refer her to rheumatology because I think they need to see her.  Please call lab to find out where her labs are.    We would have to do additional blood work for lyme dz

## 2019-12-26 ENCOUNTER — Other Ambulatory Visit: Payer: Self-pay | Admitting: Internal Medicine

## 2019-12-26 DIAGNOSIS — M0579 Rheumatoid arthritis with rheumatoid factor of multiple sites without organ or systems involvement: Secondary | ICD-10-CM

## 2019-12-28 NOTE — Telephone Encounter (Signed)
Note was sent via mychart -rheum referral ordered

## 2019-12-30 ENCOUNTER — Ambulatory Visit: Payer: Self-pay

## 2019-12-30 ENCOUNTER — Other Ambulatory Visit: Payer: Self-pay

## 2019-12-30 ENCOUNTER — Ambulatory Visit: Payer: Medicare HMO | Admitting: Internal Medicine

## 2019-12-30 ENCOUNTER — Encounter: Payer: Self-pay | Admitting: Internal Medicine

## 2019-12-30 VITALS — BP 157/75 | HR 69 | Resp 18 | Ht 62.0 in | Wt 162.0 lb

## 2019-12-30 DIAGNOSIS — Z79899 Other long term (current) drug therapy: Secondary | ICD-10-CM | POA: Insufficient documentation

## 2019-12-30 DIAGNOSIS — M0579 Rheumatoid arthritis with rheumatoid factor of multiple sites without organ or systems involvement: Secondary | ICD-10-CM | POA: Diagnosis not present

## 2019-12-30 MED ORDER — PREDNISONE 10 MG PO TABS: 10.0000 mg | ORAL_TABLET | Freq: Every day | ORAL | 0 refills | Status: DC

## 2019-12-30 NOTE — Patient Instructions (Signed)
We will call in a few days after blood test result about starting methotrexate as a long term treatment.  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 Discontinue taking aleve or meloxicam while taking the prednisone. Prednisone may cause increased blood sugar and other side effects so we will plan to use this only as a short term treatment. We should plan to follow up in about 2 weeks to see how the treatment is working.    Rheumatoid Arthritis Rheumatoid arthritis (RA) is a long-term (chronic) disease that causes inflammation in your joints. RA may start slowly. It most often affects the small joints of the hands and feet. Usually, the same joints are affected on both sides of your body. Inflammation from RA can also affect other parts of your body, including your heart, eyes, or lungs. There is no cure for RA, but medicines can help your symptoms and halt or slow down the progression of the disease. What are the causes? RA is an autoimmune disease. When you have an autoimmune disease, your body's defense system (immune system) mistakenly attacks healthy body tissues. The exact cause of RA is not known. What increases the risk? You are more likely to develop this condition if you:  Are a woman.  Have a family history of RA or other autoimmune diseases.  Have a history of smoking.  Are obese.  Have been exposed to pollutants or chemicals. What are the signs or symptoms? The first symptom of this condition may be morning stiffness that lasts longer than 30 minutes.  Symptoms usually start gradually. They are often worse in the morning. As RA progresses, symptoms may include:  Pain, stiffness, swelling, warmth, and tenderness in joints on both sides of your body.  Loss of energy.  Loss of appetite.  Weight loss.  Low-grade fever.  Dry eyes and dry mouth.  Firm lumps (rheumatoid nodules) that grow beneath your skin in areas such as your forearm  bones near your elbows and on your hands.  Changes in the appearance of joints (deformity) and loss of joint function. Symptoms of this condition vary from person to person.  Symptoms of RA often come and go.  Sometimes, symptoms get worse for a period of time. These are called flares. How is this diagnosed? This condition is diagnosed based on your symptoms, medical history, and physical exam.  You may have X-rays or an MRI to check for the type of joint changes that are caused by RA. You may also have blood tests to look for:  Proteins (antibodies) that your immune system may make if you have RA. These include rheumatoid factor (RF) and anti-CCP. ? When blood tests show these proteins, you are said to have "seropositive RA." ? When blood tests do not show these proteins, you may have "seronegative RA."  Inflammation in your blood.  A low number of red blood cells (anemia). How is this treated? The goals of treatment are to relieve pain, reduce inflammation, and slow down or stop joint damage and disability. Treatment may include:  Lifestyle changes. It is important to rest as needed, eat a healthy diet, and exercise.  Medicines. Your health care provider may adjust your medicines every 3 months until treatment goals are reached. Common medicines include: ? Pain relievers (analgesics). ? Corticosteroids and NSAIDs to reduce inflammation. ? Disease-modifying antirheumatic drugs (DMARDs) to try to slow the course of the disease. ? Biologic response modifiers to reduce inflammation and damage.  Physical therapy and occupational  therapy.  Surgery, if you have severe joint damage. Joint replacement or fusing of joints may be needed. Your health care provider will work with you to identify the best treatment option for you based on assessment of the overall disease activity in your body. Follow these instructions at home: Activity  Return to your normal activities as told by your  health care provider. Ask your health care provider what activities are safe for you.  Rest when you are having a flare.  Start an exercise program as told by your health care provider. General instructions  Keep all follow-up visits as told by your health care provider. This is important.  Take over-the-counter and prescription medicines only as told by your health care provider. Where to find more information  [employer]: www.rheumatology.[place]: www.arthritis.org Contact a health care provider if:  You have a flare-up of RA symptoms.  You have a fever.  You have side effects from your medicines. Get help right away if:  You have chest pain.  You have trouble breathing.  You quickly develop a hot, painful joint that is more severe than your usual joint aches. Summary  Rheumatoid arthritis (RA) is a long-term (chronic) disease that causes inflammation in your joints.  RA is an autoimmune disease.  The goals of treatment are to relieve pain, reduce inflammation, and slow down or stop joint damage and disability. This information is not intended to replace advice given to you by your health care provider. Make sure you discuss any questions you have with your health care provider. Document Revised: 09/09/2018 Document Reviewed: 10/29/2017 Elsevier Patient Education  [place].      Prednisone tablets What is this medicine? PREDNISONE (PRED ni sone) is a corticosteroid. It is commonly used to treat inflammation of the skin, joints, lungs, and other organs. Common conditions treated include asthma, allergies, and arthritis. It is also used for other conditions, such as blood disorders and diseases of the adrenal glands. This medicine may be used for other purposes; ask your health care provider or pharmacist if you have questions. COMMON BRAND NAME(S): Deltasone, Predone, Sterapred, Sterapred DS What should I tell my  health care provider before I take this medicine? They need to know if you have any of these conditions:  Cushing's syndrome  diabetes  glaucoma  heart disease  high blood pressure  infection (especially a virus infection such as chickenpox, cold sores, or herpes)  kidney disease  liver disease  mental illness  myasthenia gravis  osteoporosis  seizures  stomach or intestine problems  thyroid disease  an unusual or allergic reaction to lactose, prednisone, other medicines, foods, dyes, or preservatives  pregnant or trying to get pregnant  breast-feeding How should I use this medicine? Take this medicine by mouth with a glass of water. Follow the directions on the prescription label. Take this medicine with food. If you are taking this medicine once a day, take it in the morning. Do not take more medicine than you are told to take. Do not suddenly stop taking your medicine because you may develop a severe reaction. Your doctor will tell you how much medicine to take. If your doctor wants you to stop the medicine, the dose may be slowly lowered over time to avoid any side effects. Talk to your pediatrician regarding the use of this medicine in children. Special care may be needed. Overdosage: If you think you have taken too much of this medicine contact a  poison control center or emergency room at once. NOTE: This medicine is only for you. Do not share this medicine with others. What if I miss a dose? If you miss a dose, take it as soon as you can. If it is almost time for your next dose, talk to your doctor or health care professional. You may need to miss a dose or take an extra dose. Do not take double or extra doses without advice. What may interact with this medicine? Do not take this medicine with any of the following medications:  metyrapone  mifepristone This medicine may also interact with the following medications:  aminoglutethimide  amphotericin  B  aspirin and aspirin-like medicines  barbiturates  certain medicines for diabetes, like glipizide or glyburide  cholestyramine  cholinesterase inhibitors  cyclosporine  digoxin  diuretics  ephedrine  female hormones, like estrogens and birth control pills  isoniazid  ketoconazole  NSAIDS, medicines for pain and inflammation, like ibuprofen or naproxen  phenytoin  rifampin  toxoids  vaccines  warfarin This list may not describe all possible interactions. Give your health care provider a list of all the medicines, herbs, non-prescription drugs, or dietary supplements you use. Also tell them if you smoke, drink alcohol, or use illegal drugs. Some items may interact with your medicine. What should I watch for while using this medicine? Visit your doctor or health care professional for regular checks on your progress. If you are taking this medicine over a prolonged period, carry an identification card with your name and address, the type and dose of your medicine, and your doctor's name and address. This medicine may increase your risk of getting an infection. Tell your doctor or health care professional if you are around anyone with measles or chickenpox, or if you develop sores or blisters that do not heal properly. If you are going to have surgery, tell your doctor or health care professional that you have taken this medicine within the last twelve months. Ask your doctor or health care professional about your diet. You may need to lower the amount of salt you eat. This medicine may increase blood sugar. Ask your healthcare provider if changes in diet or medicines are needed if you have diabetes. What side effects may I notice from receiving this medicine? Side effects that you should report to your doctor or health care professional as soon as possible:  allergic reactions like skin rash, itching or hives, swelling of the face, lips, or tongue  changes in emotions  or moods  changes in vision  depressed mood  eye pain  fever or chills, cough, sore throat, pain or difficulty passing urine  signs and symptoms of high blood sugar such as being more thirsty or hungry or having to urinate more than normal. You may also feel very tired or have blurry vision.  swelling of ankles, feet Side effects that usually do not require medical attention (report to your doctor or health care professional if they continue or are bothersome):  confusion, excitement, restlessness  headache  nausea, vomiting  skin problems, acne, thin and shiny skin  trouble sleeping  weight gain This list may not describe all possible side effects. Call your doctor for medical advice about side effects. You may report side effects to FDA at 1-800-FDA-1088. Where should I keep my medicine? Keep out of the reach of children. Store at room temperature between 15 and 30 degrees C (59 and 86 degrees F). Protect from light. Keep container tightly closed.  Throw away any unused medicine after the expiration date. NOTE: This sheet is a summary. It may not cover all possible information. If you have questions about this medicine, talk to your doctor, pharmacist, or health care provider.  2020 Elsevier/[employer] (2017-11-26 10:54:22)      Methotrexate tablets What is this medicine? METHOTREXATE (METH oh TREX ate) is a chemotherapy drug used to treat cancer including breast cancer, leukemia, and lymphoma. This medicine can also be used to treat psoriasis and certain kinds of arthritis. This medicine may be used for other purposes; ask your health care provider or pharmacist if you have questions. COMMON BRAND NAME(S): Rheumatrex, Trexall What should I tell my health care provider before I take this medicine? They need to know if you have any of these conditions:  fluid in the stomach area or lungs  if you often drink alcohol  infection or immune system problems  kidney  disease or on hemodialysis  liver disease  low blood counts, like low white cell, platelet, or red cell counts  lung disease  radiation therapy  stomach ulcers  ulcerative colitis  an unusual or allergic reaction to methotrexate, other medicines, foods, dyes, or preservatives  pregnant or trying to get pregnant  breast-feeding How should I use this medicine? Take this medicine by mouth with a glass of water. Follow the directions on the prescription label. Take your medicine at regular intervals. Do not take it more often than directed. Do not stop taking except on your doctor's advice. Make sure you know why you are taking this medicine and how often you should take it. If this medicine is used for a condition that is not cancer, like arthritis or psoriasis, it should be taken weekly, NOT daily. Taking this medicine more often than directed can cause serious side effects, even death. Talk to your healthcare provider about safe handling and disposal of this medicine. You may need to take special precautions. Talk to your pediatrician regarding the use of this medicine in children. While this drug may be prescribed for selected conditions, precautions do apply. Overdosage: If you think you have taken too much of this medicine contact a poison control center or emergency room at once. NOTE: This medicine is only for you. Do not share this medicine with others. What if I miss a dose? If you miss a dose, talk with your doctor or health care professional. Do not take double or extra doses. What may interact with this medicine? This medicine may interact with the following medication:  acitretin  aspirin and aspirin-like medicines including salicylates  azathioprine  certain antibiotics like penicillins, tetracycline, and chloramphenicol  cyclosporine  gold  hydroxychloroquine  live virus vaccines  NSAIDs, medicines for pain and inflammation, like ibuprofen or  naproxen  other cytotoxic agents  penicillamine  phenylbutazone  phenytoin  probenecid  retinoids such as isotretinoin and tretinoin  steroid medicines like prednisone or cortisone  sulfonamides like sulfasalazine and trimethoprim/sulfamethoxazole  theophylline This list may not describe all possible interactions. Give your health care provider a list of all the medicines, herbs, non-prescription drugs, or dietary supplements you use. Also tell them if you smoke, drink alcohol, or use illegal drugs. Some items may interact with your medicine. What should I watch for while using this medicine? Avoid alcoholic drinks. This medicine can make you more sensitive to the sun. Keep out of the sun. If you cannot avoid being in the sun, wear protective clothing and use sunscreen. Do not use sun  lamps or tanning beds/booths. You may need blood work done while you are taking this medicine. Call your doctor or health care professional for advice if you get a fever, chills or sore throat, or other symptoms of a cold or flu. Do not treat yourself. This drug decreases your body's ability to fight infections. Try to avoid being around people who are sick. This medicine may increase your risk to bruise or bleed. Call your doctor or health care professional if you notice any unusual bleeding. Check with your doctor or health care professional if you get an attack of severe diarrhea, nausea and vomiting, or if you sweat a lot. The loss of too much body fluid can make it dangerous for you to take this medicine. Talk to your doctor about your risk of cancer. You may be more at risk for certain types of cancers if you take this medicine. Both men and women must use effective birth control with this medicine. Do not become pregnant while taking this medicine or until at least 1 normal menstrual cycle has occurred after stopping it. Women should inform their doctor if they wish to become pregnant or think they  might be pregnant. Men should not father a child while taking this medicine and for 3 months after stopping it. There is a potential for serious side effects to an unborn child. Talk to your health care professional or pharmacist for more information. Do not breast-feed an infant while taking this medicine. What side effects may I notice from receiving this medicine? Side effects that you should report to your doctor or health care professional as soon as possible:  allergic reactions like skin rash, itching or hives, swelling of the face, lips, or tongue  breathing problems or shortness of breath  diarrhea  dry, nonproductive cough  low blood counts - this medicine may decrease the number of white blood cells, red blood cells and platelets. You may be at increased risk for infections and bleeding.  mouth sores  redness, blistering, peeling or loosening of the skin, including inside the mouth  signs of infection - fever or chills, cough, sore throat, pain or trouble passing urine  signs and symptoms of bleeding such as bloody or black, tarry stools; red or dark-brown urine; spitting up blood or brown material that looks like coffee grounds; red spots on the skin; unusual bruising or bleeding from the eye, gums, or nose  signs and symptoms of kidney injury like trouble passing urine or change in the amount of urine  signs and symptoms of liver injury like dark yellow or brown urine; general ill feeling or flu-like symptoms; light-colored stools; loss of appetite; nausea; right upper belly pain; unusually weak or tired; yellowing of the eyes or skin Side effects that usually do not require medical attention (report to your doctor or health care professional if they continue or are bothersome):  dizziness  hair loss  tiredness  upset stomach  vomiting This list may not describe all possible side effects. Call your doctor for medical advice about side effects. You may report side  effects to FDA at 1-800-FDA-1088. Where should I keep my medicine? Keep out of the reach of children. Store at room temperature between 20 and 25 degrees C (68 and 77 degrees F). Protect from light. Throw away any unused medicine after the expiration date. NOTE: This sheet is a summary. It may not cover all possible information. If you have questions about this medicine, talk to your doctor, pharmacist,  or health care provider.  2020 Elsevier/[employer] (2016-10-18 13:38:43)

## 2019-12-30 NOTE — Progress Notes (Signed)
Office Visit Note  Patient: Raven Haynes             Date of Birth: 26-Dec-1950           MRN: [id number]             PCP: Hoyt Koch, MD Referring: Binnie Rail, MD Visit Date: 12/30/2019   Subjective:  New Patient (Initial Visit), Joint Pain, and Joint Swelling   History of Present Illness: Kasheena Dobler is a 69 y.o. female here for evaluation of joint pain in her bilateral hands, feet, shoulders, and toes associated with joint swelling and highly positive ESR, RF, and CCP titers.  The symptoms started about 2 months ago suddenly with acute pain of the knee.  She saw her primary care provider and was treated with nonsteroidal anti-inflammatories with a benefit in her joint pain but continued having significantly swelling. At follow up after a month she received her seasonal flu vaccine and felt that her hand pain and swelling worsened after this. Her pain is worst first thing in the morning and partially improves while moving around during the day, but is continuously present. She has not noticed any new skin rashes, no eye redness or pain, no oral ulcers. She is a never smoker. She has no family history of autoimmune disease or inflammatory arthritis to her knowledge.  Labs reviewed 12/2019 ANA 1:80 nuclear RF 75 CCP >250 ESR 75 CRP 1.5    Activities of Daily Living:  Patient reports morning stiffness for 2 hours.   Patient Reports nocturnal pain.  Difficulty dressing/grooming: Reports Difficulty climbing stairs: Reports Difficulty getting out of chair: Reports Difficulty using hands for taps, buttons, cutlery, and/or writing: Reports  Review of Systems  Constitutional: Positive for fatigue.  HENT: Negative for mouth sores, mouth dryness and nose dryness.   Eyes: Negative for pain, itching, visual disturbance and dryness.  Respiratory: Negative for cough, hemoptysis, shortness of breath and difficulty breathing.   Cardiovascular: Positive for chest pain and  swelling in legs/feet. Negative for palpitations.       Patient complains of chest pressure when her swelling is increased, patient has been evaluated for this issue.   Gastrointestinal: Negative for abdominal pain, blood in stool, constipation and diarrhea.  Endocrine: Negative for increased urination.  Genitourinary: Negative for painful urination.  Musculoskeletal: Positive for arthralgias, joint pain, joint swelling, myalgias, muscle weakness, morning stiffness, muscle tenderness and myalgias.  Skin: Positive for color change and redness. Negative for rash.  Allergic/Immunologic: Negative for susceptible to infections.  Neurological: Positive for weakness. Negative for dizziness, numbness, headaches and memory loss.  Hematological: Negative for swollen glands.  Psychiatric/Behavioral: Negative for confusion and sleep disturbance.    PMFS History:  Patient Active Problem List   Diagnosis Date Noted  . Rheumatoid arthritis with rheumatoid factor of multiple sites without organ or systems involvement ([place]) 12/30/2019  . High risk medication use 12/30/2019  . Polyarthralgia 12/20/2019  . Right knee pain 12/03/2019  . Lesion of nose 05/22/2019  . Impaired fasting blood sugar 12/19/2018  . Routine general medical examination at a health care facility 11/15/2015  . Essential hypertension 03/22/2015    Past Medical History:  Diagnosis Date  . Hypertension     Family History  Problem Relation Age of Onset  . Diabetes Mother   . Breast cancer Neg Hx    Past Surgical History:  Procedure Laterality Date  . KNEE SURGERY Bilateral 2004,2009   Social History   Social History  Narrative  . Not on file   Immunization History  Administered Date(s) Administered  . Fluad Quad(high Dose 65+) 12/19/2018, 12/04/2019  . Influenza, High Dose Seasonal PF 12/10/2016, 12/13/2017  . Influenza,inj,Quad PF,6+ Mos 11/15/2015  . PFIZER SARS-COV-2 Vaccination 03/31/2019, 04/18/2019  . Pneumococcal  Conjugate-13 03/22/2015  . Pneumococcal Polysaccharide-23 03/10/2014  . Tdap 09/09/2011  . Zoster 12/24/2013  . Zoster Recombinat (Shingrix) 10/04/2017     Objective: Vital Signs: BP (!) 157/75 (BP Location: Right Arm, Patient Position: Sitting, Cuff Size: Small)   Pulse 69   Resp 18   Ht _0  (1.575 m)   Wt 162 lb (73.5 kg)   BMI 29.63 kg/m    Physical Exam   Musculoskeletal Exam:  Neck full range of motion, mild right sided pain with lateral rotation Shoulder, elbow full ROM no swelling, describes tenderness over extensor surface of left forearm no physical changes appreciated Right wrist swelling with full range of motion, left wrist normal Right third MCP tenderness and swelling, partially limited flexion range of motion in right hand, left second and third MCP tenderness and soft tissue swelling on dorsum of hand with good range of motion No paraspinal tenderness to palpation over upper and lower back Normal hip internal and external rotation without pain, no tenderness to lateral hip palpation Knees bilateral effusions present, full range of motion with irregular patellar tracking, no warmth or erythema MTPs full range of motion and tender to palpation under plantar side but no swelling  CDAI Exam: CDAI Score: -- Patient Global: --; Provider Global: -- Swollen: --; Tender: -- Joint Exam 12/30/2019   No joint exam has been documented for this visit   There is currently no information documented on the homunculus. Go to the Rheumatology activity and complete the homunculus joint exam.  Investigation: No additional findings.  Imaging: DG Chest 2 View  Result Date: 12/15/2019 CLINICAL DATA:  High blood pressure.  Wrist pain. EXAM: CHEST - 2 VIEW COMPARISON:  None. FINDINGS: Artifact from EKG leads. Normal heart size. Mild aortic tortuosity. No acute infiltrate or edema. No effusion or pneumothorax. No acute osseous findings. IMPRESSION: No evidence of acute disease.  Electronically Signed   By: Monte Fantasia M.D.   On: 12/15/2019 04:56   DG Wrist Complete Right  Result Date: 12/15/2019 CLINICAL DATA:  Right wrist pain after receiving flu shot. EXAM: RIGHT WRIST - COMPLETE 3+ VIEW COMPARISON:  None. FINDINGS: Medial soft tissue swelling suspected. No fracture, erosion, or subluxation. Generalized osteopenia. IMPRESSION: Soft tissue swelling without underlying acute or erosive osseous finding. Electronically Signed   By: Monte Fantasia M.D.   On: 12/15/2019 04:55   XR Foot 2 Views Left  Result Date: 12/31/2019 X-ray left foot 2 views Normal tibiotalar joint space.  Midfoot joint space appears normal.  No significant changes in MTP, PIP, DIP joints.  Calcaneal enthesophyte present on plantar surface.  Bone mineralization appears normal.  No soft tissue swelling seen. Impression Normal foot x-ray  XR Foot 2 Views Right  Result Date: 12/31/2019 X-ray right foot 2 views Normal tibiotalar joint space.  Midfoot joint spaces appear overall normal.  Medial deviation of first digit but otherwise intact MTP joint spaces.  Mild irregularities in distal phalanges.  Calcaneal enthesophyte on plantar surface.  Bone mineralization appears overall normal.  No soft tissue swelling seen. Impression Mild degenerative changes in toes no erosive disease  XR Hand 2 View Left  Result Date: 12/31/2019 X-ray left hand 2 views radiocarpal joint space preserved  and carpal bones.  MCP joint spaces intact.  Mild joint space narrowing and possible small osteophytes was noticeable on fourth PIP and third and fourth DIP.  Borderline for cortical bone thinning on metacarpals. Impression Mild degenerative changes with no erosions  XR Hand 2 View Right  Result Date: 12/31/2019 Xray right hand 2 views Radiocarpal joint space preserved slight increased sclerosis on radial head.  Carpal bones appear normal.  MCP joint spaces appear normal.  Mild asymmetric joint space narrowing in PIP  joints. Borderline for bone cortical thinning in metacarpals no apparent juxta articular osteopenia. Impression Mild degenerative changes no erosions   Recent Labs: Lab Results  Component Value Date   WBC 6.4 12/21/2019   HGB 12.1 12/21/2019   PLT 343.0 12/21/2019   NA 137 12/21/2019   K 3.7 12/21/2019   CL 100 12/21/2019   CO2 30 12/21/2019   GLUCOSE 105 (H) 12/21/2019   BUN 13 12/21/2019   CREATININE 0.70 12/21/2019   BILITOT 0.5 12/21/2019   ALKPHOS 65 12/21/2019   AST 17 12/21/2019   ALT 14 12/21/2019   PROT 8.2 12/21/2019   ALBUMIN 4.5 12/21/2019   CALCIUM 9.9 12/21/2019   GFRAA >60 12/14/2019    Speciality Comments: No specialty comments available.  Procedures:  No procedures performed Allergies: Patient has no known allergies.   Assessment / Plan:     Visit Diagnoses: Rheumatoid arthritis with rheumatoid factor of multiple sites without organ or systems involvement ([place]) - Plan: QuantiFERON-TB Gold Plus, XR Foot 2 Views Left, XR Foot 2 Views Right, XR Hand 2 View Left, XR Hand 2 View Right, predniSONE (DELTASONE) 10 MG tablet, CANCELED: Hepatitis panel, acute  Bilateral hand swelling and tenderness very consistent with RA and knee swelling may be from combination with OA also. Xrays of hands and feet did not show erosive disease. She is early in symptoms so this is appropriate. Plan to start methotrexate after baseline labs and will treat with a low to moderate dose of prednisone for symptoms in the short term. I do not know that her vaccinations are related, although a low percentage of RA patients have been reported to suffer flares of disease activity after vaccination. If she has improvement outside of the knees we could also try intraarticular injections for the local swelling.  High risk medication use - Plan: Hepatitis B core antibody, IgM, Hepatitis B surface antigen, Hepatitis C antibody, Hepatitis B Surface AntiBODY, CANCELED: Hepatitis panel, acute  I  recommend we start treatment with DMARDs she has no known contraindication to methotrexate. Discussed potential side effects such as cytopenias, hepatic function change, nodulosis, GI toxicity and need for routine lab monitoring she is agreeable to treatment plan. We will check hepatitis panel and quantiferon anticipate starting this medicine if normal. Discussed use of glucocorticoids carries multiple risks including hyperglycemia, hypertension, fluid retention, cataracts, GI irritation she is agreeable to treatment at this time. We will use only moderate dose due to her history of hyperglycemia and try to reduce dose quickly. Reports DXA scan 2 years ago with normal density will not recommend treatment to prevent GIOP unless needs long term use.  Orders: Orders Placed This Encounter  Procedures  . XR Foot 2 Views Left  . XR Foot 2 Views Right  . XR Hand 2 View Left  . XR Hand 2 View Right  . QuantiFERON-TB Gold Plus  . Hepatitis B core antibody, IgM  . Hepatitis B surface antigen  . Hepatitis C antibody  . Hepatitis  B Surface AntiBODY   Meds ordered this encounter  Medications  . predniSONE (DELTASONE) 10 MG tablet    Sig: Take 1 tablet (10 mg total) by mouth daily with breakfast.    Dispense:  20 tablet    Refill:  0     Follow-Up Instructions: Return in about 2 weeks (around 01/13/2020).   Collier Salina, MD  Note - This record has been created using [employer].  Chart creation errors have been sought, but may not always  have been located. Such creation errors do not reflect on  the standard of medical care.

## 2020-01-01 ENCOUNTER — Ambulatory Visit: Payer: Self-pay

## 2020-01-01 ENCOUNTER — Encounter: Payer: Self-pay | Admitting: Internal Medicine

## 2020-01-01 LAB — QUANTIFERON-TB GOLD PLUS
Mitogen-NIL: 7.85 IU/mL
NIL: 0.03 IU/mL
QuantiFERON-TB Gold Plus: NEGATIVE
TB1-NIL: 0 IU/mL
TB2-NIL: 0.01 IU/mL

## 2020-01-01 LAB — HEPATITIS B SURFACE ANTIGEN: Hepatitis B Surface Ag: NONREACTIVE

## 2020-01-01 LAB — HEPATITIS C ANTIBODY
Hepatitis C Ab: NONREACTIVE
SIGNAL TO CUT-OFF: 0.01 (ref <1.00)

## 2020-01-01 LAB — HEPATITIS B CORE ANTIBODY, IGM: Hep B C IgM: NONREACTIVE

## 2020-01-01 LAB — HEPATITIS B SURFACE ANTIBODY,QUALITATIVE: Hep B S Ab: NONREACTIVE

## 2020-01-04 MED ORDER — METHOTREXATE 2.5 MG PO TABS: 15.0000 mg | ORAL_TABLET | ORAL | 0 refills | Status: DC

## 2020-01-04 NOTE — Addendum Note (Signed)
Addended by: Collier Salina on: 01/04/2020 07:17 AM   Modules accepted: Orders

## 2020-01-04 NOTE — Progress Notes (Signed)
Ms. Ribble lab tests look normal it would be safe to start methotrexate as we previously discussed. I will send prescription to the pharmacy to pick up.

## 2020-01-06 ENCOUNTER — Telehealth: Payer: Self-pay | Admitting: Rheumatology

## 2020-01-06 NOTE — Telephone Encounter (Signed)
Attempted to contact patient, LVM asking patient to call the office.

## 2020-01-06 NOTE — Telephone Encounter (Signed)
Spoke with patient, advised, per Dr. Benjamine Mola, she is recommended to start the methotrexate at 15mg  weekly dose, that is 6 tablets all together one time per week. This medication is completely different from the prednisone and does not have to stop the prednisone to start the methotrexate. Patient expressed understanding.

## 2020-01-06 NOTE — Telephone Encounter (Signed)
Please advise 

## 2020-01-06 NOTE — Telephone Encounter (Signed)
She is recommended to start the methotrexate at 15mg  weekly dose, that is 6 tablets all together one time per week. This medication is completely different from the prednisone and does not have to stop the prednisone to start the methotrexate.

## 2020-01-06 NOTE — Telephone Encounter (Signed)
Patient's husband calling in reference to medication. Patient has been taking Prednisone 10 mg, then MTX has been added 2.5 mg. Patient needs to know if she is to continue both medications together, or discontinue Prednisone since MTX was added. Please call to advise.

## 2020-01-13 ENCOUNTER — Ambulatory Visit: Payer: Medicare HMO | Admitting: Internal Medicine

## 2020-01-13 ENCOUNTER — Encounter: Payer: Self-pay | Admitting: Internal Medicine

## 2020-01-13 ENCOUNTER — Other Ambulatory Visit: Payer: Self-pay

## 2020-01-13 VITALS — BP 132/69 | HR 70 | Ht 62.0 in | Wt 161.2 lb

## 2020-01-13 DIAGNOSIS — R69 Illness, unspecified: Secondary | ICD-10-CM | POA: Diagnosis not present

## 2020-01-13 DIAGNOSIS — Z79899 Other long term (current) drug therapy: Secondary | ICD-10-CM

## 2020-01-13 DIAGNOSIS — M0579 Rheumatoid arthritis with rheumatoid factor of multiple sites without organ or systems involvement: Secondary | ICD-10-CM | POA: Diagnosis not present

## 2020-01-13 MED ORDER — PREDNISONE 10 MG PO TABS: 10.0000 mg | ORAL_TABLET | Freq: Every day | ORAL | 0 refills | Status: DC

## 2020-01-13 MED ORDER — FOLIC ACID 1 MG PO TABS: 1.0000 mg | ORAL_TABLET | Freq: Every day | ORAL | 0 refills | Status: DC

## 2020-01-13 NOTE — Progress Notes (Signed)
Office Visit Note  Patient: Raven Haynes             Date of Birth: 1950-09-26           MRN: [id number]             PCP: Hoyt Koch, MD Referring: Hoyt Koch, * Visit Date: 01/13/2020  Subjective:  Follow-up and Medication Management   History of Present Illness: Raven Haynes is a 69 y.o. female with seropositive nonerosive rheumatoid arthritis here for follow up of new patient visit with starting prednisone 10mg  daily and methotrexate 15mg  weekly for seropositive nonerosive RA.  After starting prednisone she noticed significant decrease in swelling and stiffness in her bilateral hands within 1 or 2 days.  She does continue to have some amount of swelling and is unable to tightly flex her fingers.  Her only pain complaint at this time is in the distal right leg laterally.  She indicates pain on the lateral side of the shin but that this pain often radiates up to the thigh but not all the way to the hip.  She does continue having some swelling and crepitus in the right knee but does not localize the pain to the joint.  She started taking the methotrexate on Wednesday of last week and noticed a rash on her left and then right side which fully resolved she is not sure whether this from the medication or not.  Activities of Daily Living:  Patient reports morning stiffness for 30 minutes.   Patient Reports nocturnal pain.  Difficulty dressing/grooming: Denies Difficulty climbing stairs: Reports Difficulty getting out of chair: Denies Difficulty using hands for taps, buttons, cutlery, and/or writing: Reports  Review of Systems  Constitutional: Positive for fatigue.  HENT: Negative for mouth dryness and nose dryness.   Eyes: Negative for pain, itching and dryness.  Respiratory: Negative for cough, hemoptysis, shortness of breath and difficulty breathing.   Cardiovascular: Negative for chest pain, palpitations and swelling in legs/feet.  Gastrointestinal: Negative for  abdominal pain, blood in stool, constipation and diarrhea.  Endocrine: Negative for increased urination.  Genitourinary: Negative for painful urination.  Musculoskeletal: Positive for arthralgias, joint pain, myalgias, morning stiffness and myalgias. Negative for joint swelling, muscle weakness and muscle tenderness.  Skin: Negative for color change, rash and redness.       Patient complains of rash after taking MTX that has since resolved.   Allergic/Immunologic: Negative for susceptible to infections.  Neurological: Negative for dizziness, numbness, headaches and weakness.  Hematological: Negative for swollen glands.  Psychiatric/Behavioral: Negative for sleep disturbance.    PMFS History:  Patient Active Problem List   Diagnosis Date Noted  . Rheumatoid arthritis with rheumatoid factor of multiple sites without organ or systems involvement ([place]) 12/30/2019  . High risk medication use 12/30/2019  . Polyarthralgia 12/20/2019  . Right knee pain 12/03/2019  . Lesion of nose 05/22/2019  . Impaired fasting blood sugar 12/19/2018  . Routine general medical examination at a health care facility 11/15/2015  . Essential hypertension 03/22/2015    Past Medical History:  Diagnosis Date  . Hypertension     Family History  Problem Relation Age of Onset  . Diabetes Mother   . Breast cancer Neg Hx    Past Surgical History:  Procedure Laterality Date  . KNEE SURGERY Bilateral 2004,2009   Social History   Social History Narrative  . Not on file   Immunization History  Administered Date(s) Administered  . Fluad Quad(high Dose  65+) 12/19/2018, 12/04/2019  . Influenza, High Dose Seasonal PF 12/10/2016, 12/13/2017  . Influenza,inj,Quad PF,6+ Mos 11/15/2015  . PFIZER SARS-COV-2 Vaccination 03/31/2019, 04/18/2019  . Pneumococcal Conjugate-13 03/22/2015  . Pneumococcal Polysaccharide-23 03/10/2014  . Tdap 09/09/2011  . Zoster 12/24/2013  . Zoster Recombinat (Shingrix) 10/04/2017      Objective: Vital Signs: BP 132/69 (BP Location: Left Arm, Patient Position: Sitting, Cuff Size: Small)   Pulse 70   Ht 5\' 2"  (1.575 m)   Wt 161 lb 3.2 oz (73.1 kg)   BMI 29.48 kg/m    Physical Exam Skin:    General: Skin is warm and dry.     Findings: No rash.     Comments: 2 less than 1 cm subcutaneous nodules without erythema palpable over left forearm extensor surface distal to the elbow  Neurological:     General: No focal deficit present.     Mental Status: She is alert.      Musculoskeletal Exam:  Neck full range of motion no tenderness Shoulder, elbow, wrist, full range of motion no tenderness or swelling Swelling of second and third MCP and third PIP joints of the right hand No paraspinal tenderness to palpation over upper and lower back Normal hip internal and external rotation without pain, no tenderness to lateral hip palpation Knees with a large amount of crepitus and uneven patellar tracking bilaterally but range of motion intact Ankles, MTPs full range of motion no tenderness or swelling  CDAI Exam: CDAI Score: 7.8  Patient Global: 3 mm; Provider Global: 5 mm Swollen: 5 ; Tender: 2  Joint Exam 01/13/2020      Right  Left  MCP 3  Swollen      MCP 4  Swollen      PIP 3  Swollen      Knee  Swollen Tender  Swollen Tender     Investigation: No additional findings.  Imaging: DG Chest 2 View  Result Date: 12/15/2019 CLINICAL DATA:  High blood pressure.  Wrist pain. EXAM: CHEST - 2 VIEW COMPARISON:  None. FINDINGS: Artifact from EKG leads. Normal heart size. Mild aortic tortuosity. No acute infiltrate or edema. No effusion or pneumothorax. No acute osseous findings. IMPRESSION: No evidence of acute disease. Electronically Signed   By: Monte Fantasia M.D.   On: 12/15/2019 04:56   DG Wrist Complete Right  Result Date: 12/15/2019 CLINICAL DATA:  Right wrist pain after receiving flu shot. EXAM: RIGHT WRIST - COMPLETE 3+ VIEW COMPARISON:  None. FINDINGS:  Medial soft tissue swelling suspected. No fracture, erosion, or subluxation. Generalized osteopenia. IMPRESSION: Soft tissue swelling without underlying acute or erosive osseous finding. Electronically Signed   By: Monte Fantasia M.D.   On: 12/15/2019 04:55   XR Foot 2 Views Left  Result Date: 12/31/2019 X-ray left foot 2 views Normal tibiotalar joint space.  Midfoot joint space appears normal.  No significant changes in MTP, PIP, DIP joints.  Calcaneal enthesophyte present on plantar surface.  Bone mineralization appears normal.  No soft tissue swelling seen. Impression Normal foot x-ray  XR Foot 2 Views Right  Result Date: 12/31/2019 X-ray right foot 2 views Normal tibiotalar joint space.  Midfoot joint spaces appear overall normal.  Medial deviation of first digit but otherwise intact MTP joint spaces.  Mild irregularities in distal phalanges.  Calcaneal enthesophyte on plantar surface.  Bone mineralization appears overall normal.  No soft tissue swelling seen. Impression Mild degenerative changes in toes no erosive disease  XR Hand 2 View Left  Result Date: 12/31/2019 X-ray left hand 2 views radiocarpal joint space preserved and carpal bones.  MCP joint spaces intact.  Mild joint space narrowing and possible small osteophytes was noticeable on fourth PIP and third and fourth DIP.  Borderline for cortical bone thinning on metacarpals. Impression Mild degenerative changes with no erosions  XR Hand 2 View Right  Result Date: 12/31/2019 Xray right hand 2 views Radiocarpal joint space preserved slight increased sclerosis on radial head.  Carpal bones appear normal.  MCP joint spaces appear normal.  Mild asymmetric joint space narrowing in PIP joints. Borderline for bone cortical thinning in metacarpals no apparent juxta articular osteopenia. Impression Mild degenerative changes no erosions   Recent Labs: Lab Results  Component Value Date   WBC 6.4 12/21/2019   HGB 12.1 12/21/2019   PLT  343.0 12/21/2019   NA 137 12/21/2019   K 3.7 12/21/2019   CL 100 12/21/2019   CO2 30 12/21/2019   GLUCOSE 105 (H) 12/21/2019   BUN 13 12/21/2019   CREATININE 0.70 12/21/2019   BILITOT 0.5 12/21/2019   ALKPHOS 65 12/21/2019   AST 17 12/21/2019   ALT 14 12/21/2019   PROT 8.2 12/21/2019   ALBUMIN 4.5 12/21/2019   CALCIUM 9.9 12/21/2019   GFRAA >60 12/14/2019   QFTBGOLDPLUS NEGATIVE 12/30/2019    Speciality Comments: No specialty comments available.  Procedures:  No procedures performed Allergies: Patient has no known allergies.   Assessment / Plan:     Visit Diagnoses: Rheumatoid arthritis with rheumatoid factor of multiple sites without organ or systems involvement ([place])  Joint diseases significantly improved today but she still has swelling of multiple joints especially in the right hand and bilateral knees with low disease activity despite being on 10 mg of prednisone.  We discussed concerns over adverse side effects and she is normally very healthy and avoids taking many medications.  Minimize systemic exposure risk and recommended we can plan to do intra-articular steroid injection for the right knee if it is not getting any better by next week rather than increasing systemic steroid dose.  It is still too early for symptom benefit from methotrexate so continuing prednisone 10 mg daily at this time.  High risk medication use  Discussed due to not starting methotrexate till middle of last week it is too early for reasonable monitoring of toxicity with lab work.  Recommended come back in 1 week to obtain blood work at that time.  She did experience a transient rash possibly associated with the medication I recommended that mild side effects can sometimes improve with repeated exposure but if it is getting worse to discontinue the medication.  Also recommend starting the folic acid supplementation daily to minimize risk of methotrexate toxicity.  Orders: No orders of the defined  types were placed in this encounter.  Meds ordered this encounter  Medications  . predniSONE (DELTASONE) 10 MG tablet    Sig: Take 1 tablet (10 mg total) by mouth daily with breakfast.    Dispense:  30 tablet    Refill:  0  . folic acid (FOLVITE) 1 MG tablet    Sig: Take 1 tablet (1 mg total) by mouth daily.    Dispense:  90 tablet    Refill:  0    Follow-Up Instructions: Return in about 1 week (around 01/20/2020) for MTX labs and possible knee injection.   Collier Salina, MD  Note - This record has been created using [employer].  Chart creation errors have  been sought, but may not always  have been located. Such creation errors do not reflect on  the standard of medical care.

## 2020-01-19 NOTE — Progress Notes (Signed)
Office Visit Note  Patient: Raven Haynes             Date of Birth: [birth year]           MRN: [id number]             PCP: Raven Koch, MD Referring: Raven Haynes, * Visit Date: 01/20/2020   Subjective:  Follow-up   History of Present Illness: Raven Haynes is a 69 y.o. female is here for follow up from last week for medication monitoring on methotrexate and for rheumatoid arthritis. She continues to take prednisone 10mg  daily and MTX 15mg  weekly without any reported problems. She has a very good improvement in joint pain and swelling in her hands but has continued pain on the top of the right foot and in the right shin. Her knee is not hurting. She does not notice particular swelling around this area. She denies any recent or remote injury to the right foot.  Activities of Daily Living:  Patient reports morning stiffness for 1 hour.   Patient Reports nocturnal pain.  Difficulty dressing/grooming: Denies Difficulty climbing stairs: Reports Difficulty getting out of chair: Denies Difficulty using hands for taps, buttons, cutlery, and/or writing: Reports  Review of Systems  Constitutional: Positive for fatigue.  HENT: Negative for mouth sores, mouth dryness and nose dryness.   Eyes: Negative for pain, itching, visual disturbance and dryness.  Respiratory: Negative for cough, hemoptysis, shortness of breath and difficulty breathing.   Cardiovascular: Negative for chest pain, palpitations and swelling in legs/feet.  Gastrointestinal: Negative for abdominal pain, blood in stool, constipation and diarrhea.  Endocrine: Negative for increased urination.  Genitourinary: Negative for painful urination.  Musculoskeletal: Positive for arthralgias, joint pain, joint swelling, myalgias, morning stiffness, muscle tenderness and myalgias. Negative for muscle weakness.  Skin: Negative for color change, rash and redness.  Allergic/Immunologic: Negative for susceptible to infections.   Neurological: Negative for dizziness, numbness, headaches, memory loss and weakness.  Hematological: Negative for swollen glands.  Psychiatric/Behavioral: Negative for confusion and sleep disturbance.    PMFS History:  Patient Active Problem List   Diagnosis Date Noted  . Rheumatoid arthritis with rheumatoid factor of multiple sites without organ or systems involvement ([place]) 12/30/2019  . High risk medication use 12/30/2019  . Polyarthralgia 12/20/2019  . Right knee pain 12/03/2019  . Lesion of nose 05/22/2019  . Impaired fasting blood sugar 12/19/2018  . Routine general medical examination at a health care facility 11/15/2015  . Essential hypertension 03/22/2015    Past Medical History:  Diagnosis Date  . Hypertension     Family History  Problem Relation Age of Onset  . Diabetes Mother   . Breast cancer Neg Hx    Past Surgical History:  Procedure Laterality Date  . KNEE SURGERY Bilateral 2004,2009   Social History   Social History Narrative  . Not on file   Immunization History  Administered Date(s) Administered  . Fluad Quad(high Dose 65+) 12/19/2018, 12/04/2019  . Influenza, High Dose Seasonal PF 12/10/2016, 12/13/2017  . Influenza,inj,Quad PF,6+ Mos 11/15/2015  . PFIZER SARS-COV-2 Vaccination 03/31/2019, 04/18/2019  . Pneumococcal Conjugate-13 03/22/2015  . Pneumococcal Polysaccharide-23 03/10/2014  . Tdap 09/09/2011  . Zoster 12/24/2013  . Zoster Recombinat (Shingrix) 10/04/2017     Objective: Vital Signs: BP 125/71 (BP Location: Left Arm, Patient Position: Sitting, Cuff Size: Small)   Pulse 67   Ht 5\' 2"  (1.575 m)   Wt 163 lb 3.2 oz (74 kg)   BMI 29.85 kg/m  Physical Exam Cardiovascular:     Rate and Rhythm: Normal rate and regular rhythm.  Pulmonary:     Effort: Pulmonary effort is normal.     Breath sounds: Normal breath sounds.  Skin:    General: Skin is warm and dry.     Findings: No rash.  Neurological:     General: No focal deficit  present.     Mental Status: She is alert.  Psychiatric:        Mood and Affect: Mood normal.      Musculoskeletal Exam:  Shoulder, elbow, wrist, full range of motion no tenderness or swelling Right hand 3rd digit MCP and PIP joint swelling but nontender, ROM is intact Bilateral patellofemoral crepitus present full ROM no instability no effusion Knees, ankles, MTPs full range of motion, focal tenderness over the right dorsal midfoot without overlying skin changes   CDAI Exam: CDAI Score: -- Patient Global: --; Provider Global: -- Swollen: --; Tender: -- Joint Exam 01/20/2020   No joint exam has been documented for this visit   There is currently no information documented on the homunculus. Go to the Rheumatology activity and complete the homunculus joint exam.  Investigation: No additional findings.  Imaging: XR Foot 2 Views Left  Result Date: 12/31/2019 X-ray left foot 2 views Normal tibiotalar joint space.  Midfoot joint space appears normal.  No significant changes in MTP, PIP, DIP joints.  Calcaneal enthesophyte present on plantar surface.  Bone mineralization appears normal.  No soft tissue swelling seen. Impression Normal foot x-ray  XR Foot 2 Views Right  Result Date: 12/31/2019 X-ray right foot 2 views Normal tibiotalar joint space.  Midfoot joint spaces appear overall normal.  Medial deviation of first digit but otherwise intact MTP joint spaces.  Mild irregularities in distal phalanges.  Calcaneal enthesophyte on plantar surface.  Bone mineralization appears overall normal.  No soft tissue swelling seen. Impression Mild degenerative changes in toes no erosive disease  XR Hand 2 View Left  Result Date: 12/31/2019 X-ray left hand 2 views radiocarpal joint space preserved and carpal bones.  MCP joint spaces intact.  Mild joint space narrowing and possible small osteophytes was noticeable on fourth PIP and third and fourth DIP.  Borderline for cortical bone thinning on  metacarpals. Impression Mild degenerative changes with no erosions  XR Hand 2 View Right  Result Date: 12/31/2019 Xray right hand 2 views Radiocarpal joint space preserved slight increased sclerosis on radial head.  Carpal bones appear normal.  MCP joint spaces appear normal.  Mild asymmetric joint space narrowing in PIP joints. Borderline for bone cortical thinning in metacarpals no apparent juxta articular osteopenia. Impression Mild degenerative changes no erosions   Recent Labs: Lab Results  Component Value Date   WBC 6.7 01/20/2020   HGB 12.2 01/20/2020   PLT 271 01/20/2020   NA 136 01/20/2020   K 4.0 01/20/2020   CL 100 01/20/2020   CO2 30 01/20/2020   GLUCOSE 92 01/20/2020   BUN 16 01/20/2020   CREATININE 0.76 01/20/2020   BILITOT 0.4 01/20/2020   ALKPHOS 65 12/21/2019   AST 14 01/20/2020   ALT 12 01/20/2020   PROT 6.9 01/20/2020   ALBUMIN 4.5 12/21/2019   CALCIUM 9.7 01/20/2020   GFRAA 92 01/20/2020   QFTBGOLDPLUS NEGATIVE 12/30/2019    Speciality Comments: No specialty comments available.  Procedures:  No procedures performed Allergies: Patient has no known allergies.   Assessment / Plan:     Visit Diagnoses: Rheumatoid arthritis with  rheumatoid factor of multiple sites without organ or systems involvement ([place]) - Plan: methotrexate (RHEUMATREX) 2.5 MG tablet  She has a good response to 10mg  prednisone we will continue this while waiting for methotrexate to become effective. Here today for labs after 2 wks since starting medication. We will next f/u in 4-6 wks to reassess and see if treatment change needed. Discussed she may taper prednisone down to 5mg  daily if feeling well in the interval otherwise we may at f/u. Her right foot pain is not improved to the degree of other areas. She does not have focal swelling on exam and it does not involve the ankle. I discussed options to try conservative approach or could refer for possible injection she was interested in  seeing someone who preforms midfoot joint injections if possible.  High risk medication use - Plan: CBC with Differential/Platelet, COMPLETE METABOLIC PANEL WITH GFR  Checking labs CBC, CMP today for MTX toxicity monitoring. Also discussed she was not taking folic acid 1mg  daily which is recommended so she will start at this time.  Orders: Orders Placed This Encounter  Procedures  . CBC with Differential/Platelet  . COMPLETE METABOLIC PANEL WITH GFR   Meds ordered this encounter  Medications  . methotrexate (RHEUMATREX) 2.5 MG tablet    Sig: Take 6 tablets (15 mg total) by mouth once a week. Caution:Chemotherapy. Protect from light.    Dispense:  30 tablet    Refill:  0     Follow-Up Instructions: Return in about 4 weeks (around 02/17/2020) for RA f/u for MTX adjustment and prednisone taper.   Collier Salina, MD  Note - This record has been created using [employer].  Chart creation errors have been sought, but may not always  have been located. Such creation errors do not reflect on  the standard of medical care.

## 2020-01-20 ENCOUNTER — Other Ambulatory Visit: Payer: Self-pay

## 2020-01-20 ENCOUNTER — Ambulatory Visit: Payer: Medicare HMO | Admitting: Internal Medicine

## 2020-01-20 ENCOUNTER — Encounter: Payer: Self-pay | Admitting: Internal Medicine

## 2020-01-20 ENCOUNTER — Telehealth: Payer: Self-pay | Admitting: Internal Medicine

## 2020-01-20 VITALS — BP 125/71 | HR 67 | Ht 62.0 in | Wt 163.2 lb

## 2020-01-20 DIAGNOSIS — M0579 Rheumatoid arthritis with rheumatoid factor of multiple sites without organ or systems involvement: Secondary | ICD-10-CM | POA: Diagnosis not present

## 2020-01-20 DIAGNOSIS — Z79899 Other long term (current) drug therapy: Secondary | ICD-10-CM

## 2020-01-20 MED ORDER — METHOTREXATE 2.5 MG PO TABS: 15.0000 mg | ORAL_TABLET | ORAL | 0 refills | Status: DC

## 2020-01-20 NOTE — Telephone Encounter (Signed)
I called patient, patient verbalized understanding. 

## 2020-01-20 NOTE — Telephone Encounter (Signed)
I would recommend she can take tylenol as needed to a maximum of 3000mg  in a 24 hour period. She can use topical treatments such as voltaren or lidocaine or icy hot type patches as needed.  I would avoid oral NSAIDs such as ibuprofen (motrin) or naproxen (aleve) while taking prednisone since these would increase the risk for causing stomach problems.

## 2020-01-20 NOTE — Telephone Encounter (Signed)
Patient's spouse would like to know if there is anything patient can take other than Prednisone, and MTX if needed for pain? Please call to advise.

## 2020-01-21 ENCOUNTER — Telehealth: Payer: Self-pay | Admitting: Radiology

## 2020-01-21 ENCOUNTER — Other Ambulatory Visit: Payer: Self-pay | Admitting: Radiology

## 2020-01-21 LAB — CBC WITH DIFFERENTIAL/PLATELET
Absolute Monocytes: 462 cells/uL (ref 200–950)
Basophils Absolute: 20 cells/uL (ref 0–200)
Basophils Relative: 0.3 %
Eosinophils Absolute: 141 cells/uL (ref 15–500)
Eosinophils Relative: 2.1 %
HCT: 36.7 % (ref 35.0–45.0)
Hemoglobin: 12.2 g/dL (ref 11.7–15.5)
Lymphs Abs: 2446 cells/uL (ref 850–3900)
MCH: 28.2 pg (ref 27.0–33.0)
MCHC: 33.2 g/dL (ref 32.0–36.0)
MCV: 84.8 fL (ref 80.0–100.0)
MPV: 10.2 fL (ref 7.5–12.5)
Monocytes Relative: 6.9 %
Neutro Abs: 3631 cells/uL (ref 1500–7800)
Neutrophils Relative %: 54.2 %
Platelets: 271 10*3/uL (ref 140–400)
RBC: 4.33 10*6/uL (ref 3.80–5.10)
RDW: 13.2 % (ref 11.0–15.0)
Total Lymphocyte: 36.5 %
WBC: 6.7 10*3/uL (ref 3.8–10.8)

## 2020-01-21 LAB — COMPLETE METABOLIC PANEL WITH GFR
AG Ratio: 1.7 (calc) (ref 1.0–2.5)
ALT: 12 U/L (ref 6–29)
AST: 14 U/L (ref 10–35)
Albumin: 4.3 g/dL (ref 3.6–5.1)
Alkaline phosphatase (APISO): 67 U/L (ref 37–153)
BUN: 16 mg/dL (ref 7–25)
CO2: 30 mmol/L (ref 20–32)
Calcium: 9.7 mg/dL (ref 8.6–10.4)
Chloride: 100 mmol/L (ref 98–110)
Creat: 0.76 mg/dL (ref 0.60–0.93)
GFR, Est African American: 92 mL/min/{1.73_m2} (ref >=60)
GFR, Est Non African American: 79 mL/min/{1.73_m2} (ref >=60)
Globulin: 2.6 g/dL (calc) (ref 1.9–3.7)
Glucose, Bld: 92 mg/dL (ref 65–139)
Potassium: 4 mmol/L (ref 3.5–5.3)
Sodium: 136 mmol/L (ref 135–146)
Total Bilirubin: 0.4 mg/dL (ref 0.2–1.2)
Total Protein: 6.9 g/dL (ref 6.1–8.1)

## 2020-01-21 NOTE — Progress Notes (Signed)
Blood count and liver and kidney function tests are all normal so we can continue the methotrexate as planned and follow up next month.

## 2020-01-21 NOTE — Telephone Encounter (Signed)
Spoke with patient, she complains of continued sharp pain in her foot. Patient asked for referral to ortho to explore foot injection options. Dr. Benjamine Mola made aware.

## 2020-01-22 ENCOUNTER — Other Ambulatory Visit: Payer: Self-pay | Admitting: *Deleted

## 2020-01-22 ENCOUNTER — Telehealth: Payer: Self-pay

## 2020-01-22 DIAGNOSIS — M79671 Pain in right foot: Secondary | ICD-10-CM

## 2020-01-22 NOTE — Telephone Encounter (Signed)
Patient's husband Raven Haynes called checking the status of Raven Haynes's referral to orthopedic office.  Raven Haynes states her foot is still very painful and hasn't received a call to schedule an appointment.

## 2020-01-22 NOTE — Telephone Encounter (Signed)
I called patient, referral placed and faxed to Dr. Doran Durand, patient has seen Dr. Doran Durand in the past.

## 2020-01-26 ENCOUNTER — Ambulatory Visit: Payer: Self-pay

## 2020-01-26 ENCOUNTER — Encounter: Payer: Self-pay | Admitting: Family Medicine

## 2020-01-26 ENCOUNTER — Ambulatory Visit (INDEPENDENT_AMBULATORY_CARE_PROVIDER_SITE_OTHER): Payer: Medicare HMO | Admitting: Family Medicine

## 2020-01-26 ENCOUNTER — Other Ambulatory Visit: Payer: Self-pay

## 2020-01-26 DIAGNOSIS — M25571 Pain in right ankle and joints of right foot: Secondary | ICD-10-CM

## 2020-01-26 NOTE — Progress Notes (Signed)
   Office Visit Note   Patient: Raven Haynes           Date of Birth: [birth year]           MRN: [id number] Visit Date: 01/26/2020 Requested by: Hoyt Koch, MD [address] [place],  [place] [address] PCP: Hoyt Koch, MD  Subjective: Chief Complaint  Patient presents with  . Right Ankle - Pain  . Right Foot - Pain    HPI: She is here for right foot pain.  She has rheumatoid arthritis.  She had a flare recently, she thinks from getting the flu vaccine.  Other areas have settled down, but her dorsal midfoot area continues to bother her.  She was referred for possible injection.              ROS:   All other systems were reviewed and are negative.  Objective: Vital Signs: There were no vitals taken for this visit.  Physical Exam:  General:  Alert and oriented, in no acute distress. Pulm:  Breathing unlabored. Psy:  Normal mood, congruent affect. Skin: No erythema or warmth Right foot: She is tender mostly over the extensor digitorum tendons.  She has pain with extension of her small toes against resistance, but also a little pain with EHL motion.    Imaging: US Guided Needle Placement - No Linked Charges  Result Date: 01/26/2020 Limited diagnostic ultrasound of the right foot reveals fluid within the tendon sheath around the extensor digitorum tendons in the area of her pain.  No hyperemia on power Doppler imaging today. Ultrasound-guided right foot injection: After sterile prep with Betadine, injected 3 cc 1% lidocaine without epinephrine and 40 mg methylprednisolone into the tendon sheath of the extensor digitorum.  She had good relief but not complete relief during the immediate anesthetic phase.   Assessment & Plan: 1.  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  Could be also related to midfoot degenerative change seen on x-ray. -Discussed options and elected to inject the tendon sheath today.  If she fails to get  complete relief, we will do TMT joint injection next.     Procedures: No procedures performed  No notes on file     PMFS History: Patient Active Problem List   Diagnosis Date Noted  . Rheumatoid arthritis with rheumatoid factor of multiple sites without organ or systems involvement ([place]) 12/30/2019  . High risk medication use 12/30/2019  . Polyarthralgia 12/20/2019  . Right knee pain 12/03/2019  . Lesion of nose 05/22/2019  . Impaired fasting blood sugar 12/19/2018  . Routine general medical examination at a health care facility 11/15/2015  . Essential hypertension 03/22/2015   Past Medical History:  Diagnosis Date  . Hypertension     Family History  Problem Relation Age of Onset  . Diabetes Mother   . Breast cancer Neg Hx     Past Surgical History:  Procedure Laterality Date  . KNEE SURGERY Bilateral 2004,2009   Social History   Occupational History  . 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 Smoking status: Never Smoker  . Smokeless tobacco: Never Used  Vaping Use  . Vaping Use: Never used  Substance and Sexual Activity  . Alcohol use: Not Currently  . Drug use: No  . Sexual activity: Yes

## 2020-01-27 ENCOUNTER — Telehealth: Payer: Self-pay

## 2020-01-27 NOTE — Telephone Encounter (Signed)
Patient's husband Clare Gandy called stating they received a call from Dr. Junius Roads office at [facility] which is the office patient was referred to originally.  Clare Gandy states Brendolyn had an appointment with Dr. Junius Roads yesterday, 01/26/20 and had a cortisone injection.  Clare Gandy states there has been no improvement and "the pain is now higher up on her ankle."  Ted states if Cressie doesn't feel better tomorrow they will call Dr. Junius Roads office.  Clare Gandy states he will keep Dr. Benjamine Mola updated.

## 2020-01-28 NOTE — Telephone Encounter (Signed)
I reviewed the encounter note with Dr. Ignacia Palma were believed to represent tendon inflammation. Steroid injection often takes a few days for clinical improvement. Dr. Junius Roads also mentioned possible trial of joint injection if tendon injection did not help enough though. Features to watch out for would be heat, redness, large swelling around injection area that get worse over a few days.

## 2020-01-28 NOTE — Telephone Encounter (Signed)
Please advise 

## 2020-01-28 NOTE — Telephone Encounter (Signed)
Patient has appt w/Dr. Junius Roads 01/29/2020.

## 2020-01-28 NOTE — Telephone Encounter (Signed)
LMOM for patient to return call.

## 2020-01-29 ENCOUNTER — Encounter: Payer: Self-pay | Admitting: Family Medicine

## 2020-01-29 ENCOUNTER — Other Ambulatory Visit: Payer: Self-pay

## 2020-01-29 ENCOUNTER — Ambulatory Visit (INDEPENDENT_AMBULATORY_CARE_PROVIDER_SITE_OTHER): Payer: Medicare HMO | Admitting: Family Medicine

## 2020-01-29 ENCOUNTER — Ambulatory Visit: Payer: Self-pay

## 2020-01-29 DIAGNOSIS — M25571 Pain in right ankle and joints of right foot: Secondary | ICD-10-CM

## 2020-01-29 NOTE — Progress Notes (Signed)
   Office Visit Note   Patient: Skarleth Frerichs           Date of Birth: Aug 05, 1950           MRN: [id number] Visit Date: 01/29/2020 Requested by: Hoyt Koch, MD [address] [place],  [place] [address] PCP: Hoyt Koch, MD  Subjective: Chief Complaint  Patient presents with  . Right Ankle - Pain, Follow-up    Area on the foot around the injection site. This helped to pinpoint her pain - now the pain is more in the lateral ankle and up the lateral lower leg.    HPI: She is here with persistent right ankle and foot pain.  Injection of the extensor digitorum tendon sheath helped, but now her pain seems to be pinpointed to the anterolateral ankle.  She cannot stand for more than a couple minutes before it starts to hurt.               ROS:   All other systems were reviewed and are negative.  Objective: Vital Signs: There were no vitals taken for this visit.  Physical Exam:  General:  Alert and oriented, in no acute distress. Pulm:  Breathing unlabored. Psy:  Normal mood, congruent affect. Skin: No erythema. Right ankle: She is tender over the anterolateral ankle joint line.  There is a little bit of pain in the dorsal midfoot.  Imaging: No results found.  Assessment & Plan: 1.  Persistent right foot and ankle pain, suspect impingement.  Cannot rule out stress fracture. -Discussed options with her and elected to proceed with MRI scan.  We will base treatment on the results of the MRI.     Procedures: No procedures performed  No notes on file     PMFS History: Patient Active Problem List   Diagnosis Date Noted  . Rheumatoid arthritis with rheumatoid factor of multiple sites without organ or systems involvement ([place]) 12/30/2019  . High risk medication use 12/30/2019  . Polyarthralgia 12/20/2019  . Right knee pain 12/03/2019  . Lesion of nose 05/22/2019  . Impaired fasting blood sugar 12/19/2018  . Routine general medical examination at a health  care facility 11/15/2015  . Essential hypertension 03/22/2015   Past Medical History:  Diagnosis Date  . Hypertension     Family History  Problem Relation Age of Onset  . Diabetes Mother   . Breast cancer Neg Hx     Past Surgical History:  Procedure Laterality Date  . KNEE SURGERY Bilateral 2004,2009   Social History   Occupational History  . 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 Smoking status: Never Smoker  . Smokeless tobacco: Never Used  Vaping Use  . Vaping Use: Never used  Substance and Sexual Activity  . Alcohol use: Not Currently  . Drug use: No  . Sexual activity: Yes

## 2020-02-01 ENCOUNTER — Ambulatory Visit: Payer: Medicare HMO | Admitting: Family Medicine

## 2020-02-08 ENCOUNTER — Telehealth: Payer: Self-pay

## 2020-02-08 ENCOUNTER — Telehealth: Payer: Self-pay | Admitting: Family Medicine

## 2020-02-08 ENCOUNTER — Other Ambulatory Visit: Payer: Self-pay | Admitting: Family Medicine

## 2020-02-08 MED ORDER — TRAMADOL HCL 50 MG PO TABS
50.0000 mg | ORAL_TABLET | Freq: Four times a day (QID) | ORAL | 0 refills | Status: DC | PRN
Start: 2020-02-08 — End: 2020-02-23

## 2020-02-08 NOTE — Telephone Encounter (Signed)
Patient and patient husband called in wanting to speak with terri regarding patient have extremely  pain , says her mri is supposed to be sch soon .. says the pain is getting worse in her foot on scale 1-10 she said 8 or 9

## 2020-02-08 NOTE — Telephone Encounter (Signed)
error 

## 2020-02-08 NOTE — Telephone Encounter (Signed)
I spoke with Raven Haynes. I advised him that the prior authorization for the MRI is in clinical review with [employer], which has delayed the MRI. He said he will call [employer] and check on this, himself, to see if that will speed up the process. Dr. Junius Roads has sent in an Rx for Tramadol to [facility] for the pain.

## 2020-02-10 ENCOUNTER — Other Ambulatory Visit: Payer: Self-pay | Admitting: Family Medicine

## 2020-02-10 ENCOUNTER — Telehealth: Payer: Self-pay

## 2020-02-10 DIAGNOSIS — M25571 Pain in right ankle and joints of right foot: Secondary | ICD-10-CM

## 2020-02-10 NOTE — Telephone Encounter (Signed)
Patient called in wanting to ask if dr hilts has her x rays of her foot ?

## 2020-02-10 NOTE — Telephone Encounter (Signed)
The patient is requesting that Dr. Junius Roads take a closer look at the foot xrays from Dr. Marveen Reeks office, and let her know if he sees anything different from what the report reads.

## 2020-02-11 MED ORDER — HYDROCODONE-ACETAMINOPHEN 5-325 MG PO TABS
1.0000 | ORAL_TABLET | Freq: Every evening | ORAL | 0 refills | Status: DC | PRN
Start: 2020-02-11 — End: 2020-02-23

## 2020-02-11 NOTE — Telephone Encounter (Signed)
Hydrocodone sent for severe pain at night.

## 2020-02-11 NOTE — Telephone Encounter (Signed)
I called and advised the patient of this.   The patient is taking the Tramadol only for severe pain, not every 6 hours. She says she only gets minimal relief with this, though. Is there anything else to try?

## 2020-02-11 NOTE — Telephone Encounter (Signed)
I looked and don't see anything different.

## 2020-02-12 ENCOUNTER — Other Ambulatory Visit: Payer: Medicare HMO

## 2020-02-12 NOTE — Telephone Encounter (Signed)
I called and advised the patient. 

## 2020-02-15 ENCOUNTER — Telehealth: Payer: Self-pay | Admitting: Family Medicine

## 2020-02-15 ENCOUNTER — Encounter: Payer: Self-pay | Admitting: Family Medicine

## 2020-02-15 NOTE — Telephone Encounter (Signed)
Please fax appeal letter.

## 2020-02-15 NOTE — Telephone Encounter (Signed)
Pt husband called stating that a MRI was canceled by her insurance and now she just needs PT, the husband isn't happy with due to the fact that we don't know what is going on with his wife's ankle.  Husband is very upset and wants it to be appealed. Pt is still in pain and he just wants answers.

## 2020-02-15 NOTE — Telephone Encounter (Signed)
Please advise on this.  

## 2020-02-15 NOTE — Telephone Encounter (Signed)
I spoke with the patient and advised of plan to try and see if this letter will help get the MRI approved. After talking with her husband, the patient is holding off on scheduling PT until she hears the outcome of this appeal. They are concerned that PT might do more harm than good, being that an MRI has not been done to show what is going on in the ankle.   Please send the letter to [facility] as an appeal. I will bring the signed letter to you.

## 2020-02-16 NOTE — Progress Notes (Signed)
Office Visit Note  Patient: Raven Haynes             Date of Birth: [birth year]           MRN: [id number]             PCP: Hoyt Koch, MD Referring: Hoyt Koch, * Visit Date: 02/17/2020   Subjective:  Follow-up, Medication Management, and Joint Pain (right foot)   History of Present Illness: Raven Haynes is a 69 y.o. female here for follow of seropositive RA since starting methotrexate about 6 weeks ago and on prednisone 10mg  daily. She was also seen in the interval by Dr. Junius Roads for right foot injection. She did not have any significant benefit. She was then recommended to have MRI for further assessment but had trouble getting this with insurance declined. She tolerated decreasing prednisone to 5 mg daily without problems. Her hands feel fine but her foot pain is severely limiting weight bearing activities.  Review of Systems  Constitutional: Positive for fatigue.  HENT: Negative for mouth sores, mouth dryness and nose dryness.   Eyes: Positive for visual disturbance. Negative for pain, itching and dryness.  Respiratory: Negative for cough, hemoptysis, shortness of breath and difficulty breathing.   Cardiovascular: Negative for chest pain, palpitations and swelling in legs/feet.  Gastrointestinal: Negative for abdominal pain, blood in stool, constipation and diarrhea.  Endocrine: Negative for increased urination.  Genitourinary: Negative for painful urination.  Musculoskeletal: Positive for arthralgias, joint pain, joint swelling and morning stiffness. Negative for myalgias, muscle weakness, muscle tenderness and myalgias.  Skin: Negative for color change, rash and redness.  Allergic/Immunologic: Negative for susceptible to infections.  Neurological: Positive for weakness. Negative for dizziness, numbness, headaches and memory loss.  Hematological: Negative for swollen glands.  Psychiatric/Behavioral: Negative for confusion and sleep disturbance.    PMFS  History:  Patient Active Problem List   Diagnosis Date Noted  . Pain in right ankle and joints of right foot 02/17/2020  . Rheumatoid arthritis with rheumatoid factor of multiple sites without organ or systems involvement ([place]) 12/30/2019  . High risk medication use 12/30/2019  . Polyarthralgia 12/20/2019  . Right knee pain 12/03/2019  . Lesion of nose 05/22/2019  . Impaired fasting blood sugar 12/19/2018  . Routine general medical examination at a health care facility 11/15/2015  . Essential hypertension 03/22/2015    Past Medical History:  Diagnosis Date  . Hypertension     Family History  Problem Relation Age of Onset  . Diabetes Mother   . Breast cancer Neg Hx    Past Surgical History:  Procedure Laterality Date  . KNEE SURGERY Bilateral 2004,2009   Social History   Social History Narrative  . Not on file   Immunization History  Administered Date(s) Administered  . Fluad Quad(high Dose 65+) 12/19/2018, 12/04/2019  . Influenza, High Dose Seasonal PF 12/10/2016, 12/13/2017  . Influenza,inj,Quad PF,6+ Mos 11/15/2015  . PFIZER SARS-COV-2 Vaccination 03/31/2019, 04/18/2019  . Pneumococcal Conjugate-13 03/22/2015  . Pneumococcal Polysaccharide-23 03/10/2014  . Tdap 09/09/2011  . Zoster 12/24/2013  . Zoster Recombinat (Shingrix) 10/04/2017     Objective: Vital Signs: BP (!) 160/81 (BP Location: Right Arm, Patient Position: Sitting, Cuff Size: Small)   Pulse 68   Ht 5\' 2"  (1.575 m)   Wt 166 lb (75.3 kg)   BMI 30.36 kg/m    Physical Exam Skin:    General: Skin is warm and dry.     Findings: No rash.  Neurological:  General: No focal deficit present.     Mental Status: She is alert.      Musculoskeletal Exam:  Shoulder, elbow, wrist, fingers full range of motion no tenderness or swelling, soft tissue thickening over 2-3rd MCP joints bilaterally Bilateral patellofemoral crepitus without pain or effusion in knees, ankles Right dorsal midfoot and lateral  ankle pain no swelling or redness   CDAI Exam: CDAI Score: 0.6  Patient Global: 5 mm; Provider Global: 1 mm Swollen: 0 ; Tender: 0  Joint Exam 02/17/2020   All documented joints were normal     Investigation: No additional findings.  Imaging: US Guided Needle Placement - No Linked Charges  Result Date: 01/26/2020 Limited diagnostic ultrasound of the right foot reveals fluid within the tendon sheath around the extensor digitorum tendons in the area of her pain.  No hyperemia on power Doppler imaging today. Ultrasound-guided right foot injection: After sterile prep with Betadine, injected 3 cc 1% lidocaine without epinephrine and 40 mg methylprednisolone into the tendon sheath of the extensor digitorum.  She had good relief but not complete relief during the immediate anesthetic phase.   Recent Labs: Lab Results  Component Value Date   WBC 6.7 01/20/2020   HGB 12.2 01/20/2020   PLT 271 01/20/2020   NA 136 01/20/2020   K 4.0 01/20/2020   CL 100 01/20/2020   CO2 30 01/20/2020   GLUCOSE 92 01/20/2020   BUN 16 01/20/2020   CREATININE 0.76 01/20/2020   BILITOT 0.4 01/20/2020   ALKPHOS 65 12/21/2019   AST 14 01/20/2020   ALT 12 01/20/2020   PROT 6.9 01/20/2020   ALBUMIN 4.5 12/21/2019   CALCIUM 9.7 01/20/2020   GFRAA 92 01/20/2020   QFTBGOLDPLUS NEGATIVE 12/30/2019    Speciality Comments: No specialty comments available.  Procedures:  No procedures performed Allergies: Patient has no known allergies.   Assessment / Plan:     Visit Diagnoses: Rheumatoid arthritis with rheumatoid factor of multiple sites without organ or systems involvement ([place]) - Plan: methotrexate (RHEUMATREX) 2.5 MG tablet  Disease activity seems well controlled on methotrexate 15 mg p.o. weekly she decrease prednisone to 5 mg/day without problems.  We will plan to continue the methotrexate recommended she stop prednisone entirely if disease worsens we will plan to increase medication dose at  follow-up.Continue methotrexate 15 mg p.o. weekly, folic acid 1 mg daily, decrease prednisone to 0 mg/day.  High risk medication use  She is tolerating methotrexate with no new dose adjustment so does not need repeat labs until plan follow-up in about a month.  Pain in right ankle and joints of right foot  I believe her right foot pain is a separate problem since it has not correlated at all with the inflammation and swelling and pain of all of her other joints that are doing very well now.  She has been plan to have MRI for evaluation this is currently been delayed by insurance approval.  They had some questions today about continuing to see Dr. Junius Roads or if they need to go back to Dr. Doran Durand who has previously performed ankle surgery.  Not sure whether or not her current problem require surgical intervention so I told her either office will probably have similar success with approval for MRI which would be the next step regardless.   Orders: No orders of the defined types were placed in this encounter.  Meds ordered this encounter  Medications  . methotrexate (RHEUMATREX) 2.5 MG tablet    Sig: Take 6  tablets (15 mg total) by mouth once a week. Caution:Chemotherapy. Protect from light.    Dispense:  30 tablet    Refill:  0    Follow-Up Instructions: Return in about 4 weeks (around 03/16/2020) for RA f/u.   Collier Salina, MD  Note - This record has been created using [employer].  Chart creation errors have been sought, but may not always  have been located. Such creation errors do not reflect on  the standard of medical care.

## 2020-02-17 ENCOUNTER — Ambulatory Visit: Payer: Medicare HMO | Admitting: Internal Medicine

## 2020-02-17 ENCOUNTER — Encounter: Payer: Self-pay | Admitting: Internal Medicine

## 2020-02-17 ENCOUNTER — Other Ambulatory Visit: Payer: Self-pay

## 2020-02-17 VITALS — BP 160/81 | HR 68 | Ht 62.0 in | Wt 166.0 lb

## 2020-02-17 DIAGNOSIS — Z79899 Other long term (current) drug therapy: Secondary | ICD-10-CM | POA: Diagnosis not present

## 2020-02-17 DIAGNOSIS — M25571 Pain in right ankle and joints of right foot: Secondary | ICD-10-CM | POA: Diagnosis not present

## 2020-02-17 DIAGNOSIS — M0579 Rheumatoid arthritis with rheumatoid factor of multiple sites without organ or systems involvement: Secondary | ICD-10-CM | POA: Diagnosis not present

## 2020-02-17 MED ORDER — METHOTREXATE 2.5 MG PO TABS: 15.0000 mg | ORAL_TABLET | ORAL | 0 refills | Status: DC

## 2020-02-23 ENCOUNTER — Encounter: Payer: Self-pay | Admitting: Internal Medicine

## 2020-02-23 ENCOUNTER — Ambulatory Visit (INDEPENDENT_AMBULATORY_CARE_PROVIDER_SITE_OTHER): Payer: Medicare HMO | Admitting: Internal Medicine

## 2020-02-23 ENCOUNTER — Ambulatory Visit (INDEPENDENT_AMBULATORY_CARE_PROVIDER_SITE_OTHER): Payer: Medicare HMO

## 2020-02-23 ENCOUNTER — Other Ambulatory Visit: Payer: Self-pay

## 2020-02-23 VITALS — BP 130/80 | HR 61 | Temp 98.1°F | Ht 62.0 in | Wt 164.3 lb

## 2020-02-23 VITALS — BP 130/80 | HR 61 | Temp 98.1°F | Ht 62.0 in | Wt 164.2 lb

## 2020-02-23 DIAGNOSIS — E2839 Other primary ovarian failure: Secondary | ICD-10-CM

## 2020-02-23 DIAGNOSIS — M0579 Rheumatoid arthritis with rheumatoid factor of multiple sites without organ or systems involvement: Secondary | ICD-10-CM | POA: Diagnosis not present

## 2020-02-23 DIAGNOSIS — M25571 Pain in right ankle and joints of right foot: Secondary | ICD-10-CM | POA: Diagnosis not present

## 2020-02-23 DIAGNOSIS — Z0000 Encounter for general adult medical examination without abnormal findings: Secondary | ICD-10-CM

## 2020-02-23 DIAGNOSIS — I10 Essential (primary) hypertension: Secondary | ICD-10-CM | POA: Diagnosis not present

## 2020-02-23 DIAGNOSIS — R7301 Impaired fasting glucose: Secondary | ICD-10-CM | POA: Diagnosis not present

## 2020-02-23 LAB — LIPID PANEL
Cholesterol: 170 mg/dL (ref 0–200)
HDL: 53.1 mg/dL (ref >39.00)
LDL Cholesterol: 95 mg/dL (ref 0–99)
NonHDL: 116.88
Total CHOL/HDL Ratio: 3
Triglycerides: 111 mg/dL (ref 0.0–149.0)
VLDL: 22.2 mg/dL (ref 0.0–40.0)

## 2020-02-23 LAB — HEMOGLOBIN A1C: Hgb A1c MFr Bld: 6.1 % (ref 4.6–6.5)

## 2020-02-23 NOTE — Assessment & Plan Note (Signed)
BP at goal on hctz. Recent CMP without indication for change.

## 2020-02-23 NOTE — Assessment & Plan Note (Signed)
Seeing rheumatology and taking methotrexate. Due DEXA which is ordered today.

## 2020-02-23 NOTE — Patient Instructions (Signed)
Raven Haynes , Thank you for taking time to come for your Medicare Wellness Visit. I appreciate your ongoing commitment to your health goals. Please review the following plan we discussed and let me know if I can assist you in the future.   Screening recommendations/referrals: Colonoscopy: 07/09/2013; due every 10 years Mammogram: 10/28/2019 Bone Density: 02/12/2018; scheduled for 03/2020 Recommended yearly ophthalmology/optometry visit for glaucoma screening and checkup Recommended yearly dental visit for hygiene and checkup  Vaccinations: Influenza vaccine: 12/04/2019 Pneumococcal vaccine: up to date Tdap vaccine: 09/09/2011; due every 10 years Shingles vaccine: need second dosage   Covid-19: up to date  Advanced directives: Please bring a copy of your health care power of attorney and living will to the office at your convenience.  Conditions/risks identified: Yes; Reviewed health maintenance screenings with patient today and relevant education, vaccines, and/or referrals were provided. Please continue to do your personal lifestyle choices by: daily care of teeth and gums, regular physical activity (goal should be 5 days a week for 30 minutes), eat a healthy diet, avoid tobacco and drug use, limiting any alcohol intake, taking a low-dose aspirin (if not allergic or have been advised by your provider otherwise) and taking vitamins and minerals as recommended by your provider. Continue doing brain stimulating activities (puzzles, reading, adult coloring books, staying active) to keep memory sharp. Continue to eat heart healthy diet (full of fruits, vegetables, whole grains, lean protein, water--limit salt, fat, and sugar intake) and increase physical activity as tolerated.  Next appointment: Please schedule your next Medicare Wellness Visit with your Nurse Health Advisor in 1 year by calling [phone].   Preventive Care 70 Years and Older, Female Preventive care refers to lifestyle choices and  visits with your health care provider that can promote health and wellness. What does preventive care include?  A yearly physical exam. This is also called an annual well check.  Dental exams once or twice a year.  Routine eye exams. Ask your health care provider how often you should have your eyes checked.  Personal lifestyle choices, including:  Daily care of your teeth and gums.  Regular physical activity.  Eating a healthy diet.  Avoiding tobacco and drug use.  Limiting alcohol use.  Practicing safe sex.  Taking low-dose aspirin every day.  Taking vitamin and mineral supplements as recommended by your health care provider. What happens during an annual well check? The services and screenings done by your health care provider during your annual well check will depend on your age, overall health, lifestyle risk factors, and family history of disease. Counseling  Your health care provider may ask you questions about your:  Alcohol use.  Tobacco use.  Drug use.  Emotional well-being.  Home and relationship well-being.  Sexual activity.  Eating habits.  History of falls.  Memory and ability to understand (cognition).  Work and work Statistician.  Reproductive health. Screening  You may have the following tests or measurements:  Height, weight, and BMI.  Blood pressure.  Lipid and cholesterol levels. These may be checked every 5 years, or more frequently if you are over 53 years old.  Skin check.  Lung cancer screening. You may have this screening every year starting at age 35 if you have a 30-pack-year history of smoking and currently smoke or have quit within the past 15 years.  Fecal occult blood test (FOBT) of the stool. You may have this test every year starting at age 46.  Flexible sigmoidoscopy or colonoscopy. You may have  a sigmoidoscopy every 5 years or a colonoscopy every 10 years starting at age 33.  Hepatitis C blood test.  Hepatitis B  blood test.  Sexually transmitted disease (STD) testing.  Diabetes screening. This is done by checking your blood sugar (glucose) after you have not eaten for a while (fasting). You may have this done every 1-3 years.  Bone density scan. This is done to screen for osteoporosis. You may have this done starting at age 56.  Mammogram. This may be done every 1-2 years. Talk to your health care provider about how often you should have regular mammograms. Talk with your health care provider about your test results, treatment options, and if necessary, the need for more tests. Vaccines  Your health care provider may recommend certain vaccines, such as:  Influenza vaccine. This is recommended every year.  Tetanus, diphtheria, and acellular pertussis (Tdap, Td) vaccine. You may need a Td booster every 10 years.  Zoster vaccine. You may need this after age 51.  Pneumococcal 13-valent conjugate (PCV13) vaccine. One dose is recommended after age 40.  Pneumococcal polysaccharide (PPSV23) vaccine. One dose is recommended after age 64. Talk to your health care provider about which screenings and vaccines you need and how often you need them. This information is not intended to replace advice given to you by your health care provider. Make sure you discuss any questions you have with your health care provider. Document Released: 03/25/2015 Document Revised: 11/16/2015 Document Reviewed: 12/28/2014 Elsevier Interactive Patient Education  2017 [place] Prevention in the Home Falls can cause injuries. They can happen to people of all ages. There are many things you can do to make your home safe and to help prevent falls. What can I do on the outside of my home?  Regularly fix the edges of walkways and driveways and fix any cracks.  Remove anything that might make you trip as you walk through a door, such as a raised step or threshold.  Trim any bushes or trees on the path to your  home.  Use bright outdoor lighting.  Clear any walking paths of anything that might make someone trip, such as rocks or tools.  Regularly check to see if handrails are loose or broken. Make sure that both sides of any steps have handrails.  Any raised decks and porches should have guardrails on the edges.  Have any leaves, snow, or ice cleared regularly.  Use sand or salt on walking paths during winter.  Clean up any spills in your garage right away. This includes oil or grease spills. What can I do in the bathroom?  Use night lights.  Install grab bars by the toilet and in the tub and shower. Do not use towel bars as grab bars.  Use non-skid mats or decals in the tub or shower.  If you need to sit down in the shower, use a plastic, non-slip stool.  Keep the floor dry. Clean up any water that spills on the floor as soon as it happens.  Remove soap buildup in the tub or shower regularly.  Attach bath mats securely with double-sided non-slip rug tape.  Do not have throw rugs and other things on the floor that can make you trip. What can I do in the bedroom?  Use night lights.  Make sure that you have a light by your bed that is easy to reach.  Do not use any sheets or blankets that are too big for your bed.  They should not hang down onto the floor.  Have a firm chair that has side arms. You can use this for support while you get dressed.  Do not have throw rugs and other things on the floor that can make you trip. What can I do in the kitchen?  Clean up any spills right away.  Avoid walking on wet floors.  Keep items that you use a lot in easy-to-reach places.  If you need to reach something above you, use a strong step stool that has a grab bar.  Keep electrical cords out of the way.  Do not use floor polish or wax that makes floors slippery. If you must use wax, use non-skid floor wax.  Do not have throw rugs and other things on the floor that can make you  trip. What can I do with my stairs?  Do not leave any items on the stairs.  Make sure that there are handrails on both sides of the stairs and use them. Fix handrails that are broken or loose. Make sure that handrails are as long as the stairways.  Check any carpeting to make sure that it is firmly attached to the stairs. Fix any carpet that is loose or worn.  Avoid having throw rugs at the top or bottom of the stairs. If you do have throw rugs, attach them to the floor with carpet tape.  Make sure that you have a light switch at the top of the stairs and the bottom of the stairs. If you do not have them, ask someone to add them for you. What else can I do to help prevent falls?  Wear shoes that:  Do not have high heels.  Have rubber bottoms.  Are comfortable and fit you well.  Are closed at the toe. Do not wear sandals.  If you use a stepladder:  Make sure that it is fully opened. Do not climb a closed stepladder.  Make sure that both sides of the stepladder are locked into place.  Ask someone to hold it for you, if possible.  Clearly mark and make sure that you can see:  Any grab bars or handrails.  First and last steps.  Where the edge of each step is.  Use tools that help you move around (mobility aids) if they are needed. These include:  Canes.  Walkers.  Scooters.  Crutches.  Turn on the lights when you go into a dark area. Replace any light bulbs as soon as they burn out.  Set up your furniture so you have a clear path. Avoid moving your furniture around.  If any of your floors are uneven, fix them.  If there are any pets around you, be aware of where they are.  Review your medicines with your doctor. Some medicines can make you feel dizzy. This can increase your chance of falling. Ask your doctor what other things that you can do to help prevent falls. This information is not intended to replace advice given to you by your health care provider. Make  sure you discuss any questions you have with your health care provider. Document Released: 12/23/2008 Document Revised: 08/04/2015 Document Reviewed: 04/02/2014 Elsevier Interactive Patient Education  2017 [employer].

## 2020-02-23 NOTE — Progress Notes (Signed)
Subjective:   Raven Haynes is a 69 y.o. female who presents for Medicare Annual (Subsequent) preventive examination.  Review of Systems    No ROS. Medicare Wellness Visit. Additional risk factors are reflected in social history. Cardiac Risk Factors include: advanced age (>81men, >78 women);family history of premature cardiovascular disease;hypertension     Objective:    Today's Vitals   02/23/20 0825  BP: 130/80  Pulse: 61  Temp: 98.1 F (36.7 C)  SpO2: 99%  Weight: 164 lb 3.2 oz (74.5 kg)  Height: 5\' 2"  (1.575 m)  PainSc: 8   PainLoc: Ankle   Body mass index is 30.03 kg/m.  Advanced Directives 02/23/2020 12/19/2018 12/13/2017  Does Patient Have a Medical Advance Directive? Yes Yes Yes  Type of Advance Directive - [place];Living will [place];Living will  Copy of [place] in Chart? - No - copy requested No - copy requested  Would patient like information on creating a medical advance directive? No - Patient declined - -    Current Medications (verified) Outpatient Encounter Medications as of 02/23/2020  Medication Sig  . B Complex-C (SUPER B COMPLEX PO) Take by mouth.  . Calcium Citrate-Vitamin D (CITRACAL + D PO) Take by mouth.  . Cholecalciferol (D3-1000 PO) Take by mouth.  . Cyanocobalamin (VITAMIN B 12 PO) Take by mouth.  . folic acid (FOLVITE) 1 MG tablet Take 1 tablet (1 mg total) by mouth daily.  . hydrochlorothiazide (HYDRODIURIL) 25 MG tablet Take 1 tablet (25 mg total) by mouth daily.  Marland Kitchen losartan (COZAAR) 25 MG tablet Take 1 tablet (25 mg total) by mouth daily.  . Magnesium 400 MG CAPS Take by mouth.  . methotrexate (RHEUMATREX) 2.5 MG tablet Take 6 tablets (15 mg total) by mouth once a week. Caution:Chemotherapy. Protect from light.  . Red Yeast Rice Extract (RED YEAST RICE PO) Take by mouth.  . TURMERIC PO Take by mouth.  . Zinc Sulfate (ZINC 15 PO) Take by mouth.  Marland Kitchen HYDROcodone-acetaminophen  (NORCO/VICODIN) 5-325 MG tablet Take 1-2 tablets by mouth at bedtime as needed for moderate pain. (Patient not taking: No sig reported)  . traMADol (ULTRAM) 50 MG tablet Take 1 tablet (50 mg total) by mouth every 6 (six) hours as needed. (Patient not taking: No sig reported)   No facility-administered encounter medications on file as of 02/23/2020.    Allergies (verified) Patient has no known allergies.   History: Past Medical History:  Diagnosis Date  . Hypertension    Past Surgical History:  Procedure Laterality Date  . KNEE SURGERY Bilateral 2004,2009   Family History  Problem Relation Age of Onset  . Diabetes Mother   . Breast cancer Neg Hx    Social History   Socioeconomic History  . Marital status: Married    Spouse name: Not on file  . Number of children: 2  . Years of education: Not on file  . Highest education level: Not on file  Occupational History  . 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 Smoking status: Never Smoker  . Smokeless tobacco: Never Used  Vaping Use  . Vaping Use: Never used  Substance and Sexual Activity  . Alcohol use: Not Currently  . Drug use: No  . Sexual activity: Yes  Other Topics Concern  . Not on file  Social History Narrative  . Not on file   Social Determinants of Health  Financial Resource Strain: Low Risk   . Difficulty of Paying Living Expenses: Not hard at all  Food Insecurity: No Food Insecurity  . Worried About Charity fundraiser in the Last Year: Never true  . Ran Out of Food in the Last Year: Never true  Transportation Needs: No Transportation Needs  . Lack of Transportation (Medical): No  . Lack of Transportation (Non-Medical): No  Physical Activity: Sufficiently Active  . Days of Exercise per Week: 5 days  . Minutes of Exercise per Session: 30 min  Stress: No Stress Concern Present  . Feeling of Stress : Not at all  Social Connections: Not on file     Tobacco Counseling Counseling given: Not Answered   Clinical Intake:  Pre-visit preparation completed: Yes  Pain : 0-10 Pain Score: 8  Pain Type: Acute pain Pain Location: Ankle Pain Orientation: Right Pain Radiating Towards: right foot Pain Descriptors / Indicators: Aching Pain Onset: More than a month ago Pain Frequency: Intermittent Pain Relieving Factors: none Effect of Pain on Daily Activities: Pain can diminish job performance, lower motivation to exercise, and prevent you from completing daily tasks. Pain produces disability and affects the quality of life.  Pain Relieving Factors: none  BMI - recorded: 30.03 Nutritional Status: BMI > 30  Obese Nutritional Risks: None Diabetes: No  How often do you need to have someone help you when you read instructions, pamphlets, or other written materials from your doctor or pharmacy?: 1 - Never What is the last grade level you completed in school?: [employer]; [employer]  Diabetic? no  Interpreter Needed?: No  Information entered by :: Raven Abu, LPN   Activities of Daily Living In your present state of health, do you have any difficulty performing the following activities: 02/23/2020  Hearing? N  Vision? N  Difficulty concentrating or making decisions? N  Walking or climbing stairs? N  Dressing or bathing? N  Doing errands, shopping? N  Preparing Food and eating ? N  Using the Toilet? N  In the past six months, have you accidently leaked urine? N  Do you have problems with loss of bowel control? N  Managing your Medications? N  Managing your Finances? N  Housekeeping or managing your Housekeeping? N  Some recent data might be hidden    Patient Care Team: Hoyt Koch, MD as PCP - General (Internal Medicine)  Indicate any recent Medical Services you may have received from other than [facility] providers in the past year (date may be approximate).     Assessment:   This is a routine wellness  examination for Raven Haynes.  Hearing/Vision screen No exam data present  Dietary issues and exercise activities discussed: Current Exercise Habits: The patient does not participate in regular exercise at present, Exercise limited by: orthopedic condition(s)  Goals    . Patient Stated     I want to reduce that amount of salt that I eat,  use portion control and continue to exercise with my husband. I want to take some time just for me to relax and meditate.      Depression Screen PHQ 2/9 Scores 02/23/2020 02/23/2020 12/19/2018 12/13/2017 12/13/2017 12/10/2016 03/22/2015  PHQ - 2 Score 0 1 0 0 0 0 0    Fall Risk Fall Risk  02/23/2020 02/23/2020 12/21/2019 12/19/2018 12/19/2018  Falls in the past year? 0 0 - 0 0  Number falls in past yr: 0 0 0 0 -  Injury with Fall? 0 0 1 0 -  Risk for fall due to : No Fall Risks - No Fall Risks - -  Follow up Falls evaluation completed - Falls evaluation completed Falls prevention discussed -    FALL RISK PREVENTION PERTAINING TO THE HOME:  Any stairs in or around the home? Yes  If so, are there any without handrails? No  Home free of loose throw rugs in walkways, pet beds, electrical cords, etc? Yes  Adequate lighting in your home to reduce risk of falls? Yes   ASSISTIVE DEVICES UTILIZED TO PREVENT FALLS:  Life alert? No  Use of a cane, walker or w/c? No  Grab bars in the bathroom? No  Shower chair or bench in shower? Yes  Elevated toilet seat or a handicapped toilet? Yes   TIMED UP AND GO:  Was the test performed? No .  Length of time to ambulate 10 feet: 0 sec.   Gait steady and fast without use of assistive device  Cognitive Function:  Normal cognitive status assessed by direct observation by this Nurse Health Advisor. No abnormalities found.          Immunizations Immunization History  Administered Date(s) Administered  . Fluad Quad(high Dose 65+) 12/19/2018, 12/04/2019  . Influenza, High Dose Seasonal PF 12/10/2016, 12/13/2017  .  Influenza,inj,Quad PF,6+ Mos 11/15/2015  . PFIZER SARS-COV-2 Vaccination 03/31/2019, 04/18/2019  . Pneumococcal Conjugate-13 03/22/2015  . Pneumococcal Polysaccharide-23 03/10/2014  . Tdap 09/09/2011  . Zoster 12/24/2013  . Zoster Recombinat (Shingrix) 10/04/2017    TDAP status: Up to date  Flu Vaccine status: Up to date  Pneumococcal vaccine status: Up to date  Covid-19 vaccine status: Completed vaccines  Qualifies for Shingles Vaccine? Yes   Zostavax completed Yes   Shingrix Completed?: Yes  Screening Tests Health Maintenance  Topic Date Due  . PNA vac Low Risk Adult (2 of 2 - PPSV23) 03/11/2019  . COVID-19 Vaccine (3 - Booster for Pfizer series) 10/16/2019  . TETANUS/TDAP  09/08/2021  . MAMMOGRAM  10/27/2021  . COLONOSCOPY  07/10/2023  . INFLUENZA VACCINE  Completed  . DEXA SCAN  Completed  . Hepatitis C Screening  Completed    Health Maintenance  Health Maintenance Due  Topic Date Due  . PNA vac Low Risk Adult (2 of 2 - PPSV23) 03/11/2019  . COVID-19 Vaccine (3 - Booster for Pfizer series) 10/16/2019    Colorectal cancer screening: Type of screening: Colonoscopy. Completed 07/09/2013. Repeat every 10 years  Mammogram status: Completed 10/28/2019. Repeat every year  Bone Density status: Completed 02/12/2018. Results reflect: Bone density results: OSTEOPENIA. Repeat every 2-3 years.  Lung Cancer Screening: (Low Dose CT Chest recommended if Age 30-80 years, 30 pack-year currently smoking OR have quit w/in 15years.) does not qualify.   Lung Cancer Screening Referral: no  Additional Screening:  Hepatitis C Screening: does qualify; Completed yes  Vision Screening: Recommended annual ophthalmology exams for early detection of glaucoma and other disorders of the eye. Is the patient up to date with their annual eye exam?  Yes  Who is the provider or what is the name of the office in which the patient attends annual eye exams? Looking for new eye doctor If pt is not  established with a provider, would they like to be referred to a provider to establish care? No .   Dental Screening: Recommended annual dental exams for proper oral hygiene  Community Resource Referral / Chronic Care Management: CRR required this visit?  No   CCM required this visit?  No  Plan:     I have personally reviewed and noted the following in the patient's chart:   . Medical and social history . Use of alcohol, tobacco or illicit drugs  . Current medications and supplements . Functional ability and status . Nutritional status . Physical activity . Advanced directives . List of other physicians . Hospitalizations, surgeries, and ER visits in previous 12 months . Vitals . Screenings to include cognitive, depression, and falls . Referrals and appointments  In addition, I have reviewed and discussed with patient certain preventive protocols, quality metrics, and best practice recommendations. A written personalized care plan for preventive services as well as general preventive health recommendations were provided to patient.     Sheral Flow, LPN   [id number]   Nurse Notes: n/a

## 2020-02-23 NOTE — Assessment & Plan Note (Signed)
Flu shot completed. Covid-19 2 shots waiting for booster for now counseled. Pneumonia declines 23 today due to recent reaction with flu shot. Shingrix counseled. Tetanus due 2023. Colonoscopy due 2025. Mammogram due 2023, pap smear aged out and dexa ordered today. Counseled about sun safety and mole surveillance. Counseled about the dangers of distracted driving. Given 10 year screening recommendations.

## 2020-02-23 NOTE — Assessment & Plan Note (Signed)
Seeing orthopedics and awaiting MRI currently waiting on appeal decision.

## 2020-02-23 NOTE — Assessment & Plan Note (Signed)
Checking Hga1c and adjust as needed.  

## 2020-02-23 NOTE — Progress Notes (Signed)
   Subjective:   Patient ID: Raven Haynes, female    DOB: Feb 14, 1951, 69 y.o.   MRN: [id number]  HPI The patient is a 69 YO female coming in for physical.   PMH, [place], social history reviewed and updated  Review of Systems  Constitutional: Negative.   HENT: Negative.   Eyes: Negative.   Respiratory: Negative for cough, chest tightness and shortness of breath.   Cardiovascular: Negative for chest pain, palpitations and leg swelling.  Gastrointestinal: Negative for abdominal distention, abdominal pain, constipation, diarrhea, nausea and vomiting.  Musculoskeletal: Positive for arthralgias.  Skin: Negative.   Neurological: Negative.   Psychiatric/Behavioral: Negative.     Objective:  Physical Exam Constitutional:      Appearance: She is well-developed and well-nourished.  HENT:     Head: Normocephalic and atraumatic.  Eyes:     Extraocular Movements: EOM normal.  Cardiovascular:     Rate and Rhythm: Normal rate and regular rhythm.  Pulmonary:     Effort: Pulmonary effort is normal. No respiratory distress.     Breath sounds: Normal breath sounds. No wheezing or rales.  Abdominal:     General: Bowel sounds are normal. There is no distension.     Palpations: Abdomen is soft.     Tenderness: There is no abdominal tenderness. There is no rebound.  Musculoskeletal:        General: Tenderness present. No edema.     Cervical back: Normal range of motion.     Comments: Right ankle pain  Skin:    General: Skin is warm and dry.  Neurological:     Mental Status: She is alert and oriented to person, place, and time.     Coordination: Coordination normal.  Psychiatric:        Mood and Affect: Mood and affect normal.     Vitals:   02/23/20 0904  BP: 130/80  Pulse: 61  Temp: 98.1 F (36.7 C)  TempSrc: Oral  SpO2: 99%  Weight: 164 lb 4.8 oz (74.5 kg)  Height: 5\' 2"  (1.575 m)    This visit occurred during the SARS-CoV-2 public health emergency.  Safety protocols were in  place, including screening questions prior to the visit, additional usage of staff PPE, and extensive cleaning of exam room while observing appropriate contact time as indicated for disinfecting solutions.   Assessment & Plan:

## 2020-02-23 NOTE — Patient Instructions (Signed)
Health Maintenance, Female Adopting a healthy lifestyle and getting preventive care are important in promoting health and wellness. Ask your health care provider about:  The right schedule for you to have regular tests and exams.  Things you can do on your own to prevent diseases and keep yourself healthy. What should I know about diet, weight, and exercise? Eat a healthy diet   Eat a diet that includes plenty of vegetables, fruits, low-fat dairy products, and lean protein.  Do not eat a lot of foods that are high in solid fats, added sugars, or sodium. Maintain a healthy weight Body mass index (BMI) is used to identify weight problems. It estimates body fat based on height and weight. Your health care provider can help determine your BMI and help you achieve or maintain a healthy weight. Get regular exercise Get regular exercise. This is one of the most important things you can do for your health. Most adults should:  Exercise for at least 150 minutes each week. The exercise should increase your heart rate and make you sweat (moderate-intensity exercise).  Do strengthening exercises at least twice a week. This is in addition to the moderate-intensity exercise.  Spend less time sitting. Even light physical activity can be beneficial. Watch cholesterol and blood lipids Have your blood tested for lipids and cholesterol at 70 years of age, then have this test every 5 years. Have your cholesterol levels checked more often if:  Your lipid or cholesterol levels are high.  You are older than 70 years of age.  You are at high risk for heart disease. What should I know about cancer screening? Depending on your health history and family history, you may need to have cancer screening at various ages. This may include screening for:  Breast cancer.  Cervical cancer.  Colorectal cancer.  Skin cancer.  Lung cancer. What should I know about heart disease, diabetes, and high blood  pressure? Blood pressure and heart disease  High blood pressure causes heart disease and increases the risk of stroke. This is more likely to develop in people who have high blood pressure readings, are of African descent, or are overweight.  Have your blood pressure checked: ? Every 3-5 years if you are 18-39 years of age. ? Every year if you are 40 years old or older. Diabetes Have regular diabetes screenings. This checks your fasting blood sugar level. Have the screening done:  Once every three years after age 40 if you are at a normal weight and have a low risk for diabetes.  More often and at a younger age if you are overweight or have a high risk for diabetes. What should I know about preventing infection? Hepatitis B If you have a higher risk for hepatitis B, you should be screened for this virus. Talk with your health care provider to find out if you are at risk for hepatitis B infection. Hepatitis C Testing is recommended for:  Everyone born from 1945 through 1965.  Anyone with known risk factors for hepatitis C. Sexually transmitted infections (STIs)  Get screened for STIs, including gonorrhea and chlamydia, if: ? You are sexually active and are younger than 70 years of age. ? You are older than 70 years of age and your health care provider tells you that you are at risk for this type of infection. ? Your sexual activity has changed since you were last screened, and you are at increased risk for chlamydia or gonorrhea. Ask your health care provider if   you are at risk.  Ask your health care provider about whether you are at high risk for HIV. Your health care provider may recommend a prescription medicine to help prevent HIV infection. If you choose to take medicine to prevent HIV, you should first get tested for HIV. You should then be tested every 3 months for as long as you are taking the medicine. Pregnancy  If you are about to stop having your period (premenopausal) and  you may become pregnant, seek counseling before you get pregnant.  Take 400 to 800 micrograms (mcg) of folic acid every day if you become pregnant.  Ask for birth control (contraception) if you want to prevent pregnancy. Osteoporosis and menopause Osteoporosis is a disease in which the bones lose minerals and strength with aging. This can result in bone fractures. If you are 65 years old or older, or if you are at risk for osteoporosis and fractures, ask your health care provider if you should:  Be screened for bone loss.  Take a calcium or vitamin D supplement to lower your risk of fractures.  Be given hormone replacement therapy (HRT) to treat symptoms of menopause. Follow these instructions at home: Lifestyle  Do not use any products that contain nicotine or tobacco, such as cigarettes, e-cigarettes, and chewing tobacco. If you need help quitting, ask your health care provider.  Do not use street drugs.  Do not share needles.  Ask your health care provider for help if you need support or information about quitting drugs. Alcohol use  Do not drink alcohol if: ? Your health care provider tells you not to drink. ? You are pregnant, may be pregnant, or are planning to become pregnant.  If you drink alcohol: ? Limit how much you use to 0-1 drink a day. ? Limit intake if you are breastfeeding.  Be aware of how much alcohol is in your drink. In the [place] one drink equals one 12 oz bottle of beer (355 mL), one 5 oz glass of wine (148 mL), or one 1 oz glass of hard liquor (44 mL). General instructions  Schedule regular health, dental, and eye exams.  Stay current with your vaccines.  Tell your health care provider if: ? You often feel depressed. ? You have ever been abused or do not feel safe at home. Summary  Adopting a healthy lifestyle and getting preventive care are important in promoting health and wellness.  Follow your health care provider's instructions about healthy  diet, exercising, and getting tested or screened for diseases.  Follow your health care provider's instructions on monitoring your cholesterol and blood pressure. This information is not intended to replace advice given to you by your health care provider. Make sure you discuss any questions you have with your health care provider. Document Revised: 02/19/2018 Document Reviewed: 02/19/2018 Elsevier Patient Education  2020 [employer]  

## 2020-02-25 ENCOUNTER — Other Ambulatory Visit: Payer: Self-pay

## 2020-02-25 ENCOUNTER — Ambulatory Visit (INDEPENDENT_AMBULATORY_CARE_PROVIDER_SITE_OTHER)
Admission: RE | Admit: 2020-02-25 | Discharge: 2020-02-25 | Disposition: A | Discharge status: Home / Self Care | Payer: Medicare HMO | Source: Ambulatory Visit

## 2020-02-25 DIAGNOSIS — E2839 Other primary ovarian failure: Secondary | ICD-10-CM | POA: Diagnosis not present

## 2020-03-01 ENCOUNTER — Ambulatory Visit: Payer: Medicare HMO | Admitting: Physical Therapy

## 2020-03-01 ENCOUNTER — Other Ambulatory Visit: Payer: Self-pay

## 2020-03-01 ENCOUNTER — Encounter: Payer: Self-pay | Admitting: Physical Therapy

## 2020-03-01 DIAGNOSIS — M25571 Pain in right ankle and joints of right foot: Secondary | ICD-10-CM | POA: Diagnosis not present

## 2020-03-01 DIAGNOSIS — M25671 Stiffness of right ankle, not elsewhere classified: Secondary | ICD-10-CM | POA: Diagnosis not present

## 2020-03-01 DIAGNOSIS — M6281 Muscle weakness (generalized): Secondary | ICD-10-CM | POA: Diagnosis not present

## 2020-03-01 DIAGNOSIS — R2681 Unsteadiness on feet: Secondary | ICD-10-CM | POA: Diagnosis not present

## 2020-03-01 DIAGNOSIS — R2689 Other abnormalities of gait and mobility: Secondary | ICD-10-CM

## 2020-03-01 NOTE — Patient Instructions (Signed)
Access Code: [email or web address] URL: [email or web address] Date: 03/01/2020 Prepared by: Faustino Congress  Exercises Seated Toe Flexion Extension PROM - 3 x daily - 7 x weekly - 1 sets - 3 reps - 30 sec hold Standing Ankle Dorsiflexor Toe Extensor Stretch - 3 x daily - 7 x weekly - 1 sets - 3 reps - 30 sec hold Seated Ankle Alphabet - 2 x daily - 7 x weekly - 1 sets - 1 a-z Seated Ankle Circles - 2 x daily - 7 x weekly - 2 sets - 15 reps Seated Ankle Pumps on Table - 2 x daily - 7 x weekly - 2 sets - 15 reps Roller Massager Elongated Adductor Release - 2 x daily - 7 x weekly - 1 sets - 1 reps - 2-3 min hold

## 2020-03-01 NOTE — Therapy (Signed)
[place], [place], [address] Phone: [phone]   Fax:  [phone]  Physical Therapy Evaluation  Patient Details  Name: Raven Haynes MRN: [id number] Date of Birth: 1951-01-23 Referring Provider (PT): Eunice Blase, MD   Encounter Date: 03/01/2020   PT End of Session - 03/01/20 1055    Visit Number 1    Number of Visits 12    Date for PT Re-Evaluation 04/12/20    PT Start Time 1010    PT Stop Time 1053    PT Time Calculation (min) 43 min    Activity Tolerance Patient tolerated treatment well    Behavior During Therapy [facility] for tasks assessed/performed           Past Medical History:  Diagnosis Date  . Hypertension     Past Surgical History:  Procedure Laterality Date  . KNEE SURGERY Bilateral 2004,2009    There were no vitals filed for this visit.    Subjective Assessment - 03/01/20 1013    Subjective Pt is a 69 y/o female who presents to OPPT for Rt ankle pain x almost 3 months.  Pt reports she was Rx medication for knee pain, and at follow up visit had flu vaccine which resulted in joint and body pains/inflammation.  At that time dx with RA; and all other joint pains seem to be improved except the pain in her Rt ankle.    Pertinent History RA, HTN    Limitations Standing;Walking    How long can you stand comfortably? variable 5-30 min    Diagnostic tests xrays    Patient Stated Goals improve pain, learn what to do to help self at home until MRI, exercise    Currently in Pain? Yes    Pain Score 4    up to 8/10; at best 3/10   Pain Location Ankle    Pain Orientation Right    Pain Descriptors / Indicators Sharp;Shooting    Pain Type Acute pain;Chronic pain    Pain Onset More than a month ago    Pain Frequency Constant    Aggravating Factors  standing/walking    Pain Relieving Factors rest, ice, tylenol              [facility] PT Assessment - 03/01/20 1019      Assessment   Medical Diagnosis  M25.571 (ICD-10-CM) - Pain in right ankle and joints of right foot    Referring Provider (PT) Hilts, Legrand Como, MD    Onset Date/Surgical Date 12/11/19    Hand Dominance Right    Next MD Visit PRN    Prior Therapy n/a      Precautions   Precautions None      Restrictions   Weight Bearing Restrictions No      Balance Screen   Has the patient fallen in the past 6 months No    Has the patient had a decrease in activity level because of a fear of falling?  No    Is the patient reluctant to leave their home because of a fear of falling?  No      Home Environment   Living Environment Private residence    Living Arrangements Spouse/significant other    Type of [place] to enter    Entrance Stairs-Number of Steps 2    Entrance Stairs-Rails Right    Home Layout Two level;Able to live on main level with bedroom/bathroom    Additional Comments occasional  issues with stairs - holds railing, step to pattern      Prior Function   Level of Independence Independent    Vocation Part time employment    Vocation Requirements currently not working - was helping out with [employer]    Leisure reading, crochet, walking; prior to injury - elliptical, free weights      Observation/Other Assessments   Focus on Therapeutic Outcomes (FOTO)  40 (predicted 62)      ROM / Strength   AROM / PROM / Strength AROM;Strength;PROM      AROM   AROM Assessment Site Ankle    Right/Left Ankle Right    Right Ankle Dorsiflexion -9    Right Ankle Plantar Flexion 62    Right Ankle Inversion 30    Right Ankle Eversion 10      PROM   PROM Assessment Site Ankle    Right/Left Ankle Right    Right Ankle Dorsiflexion 8    Right Ankle Eversion 16      Strength   Strength Assessment Site Ankle    Right/Left Ankle Right    Right Ankle Dorsiflexion 3-/5    Right Ankle Plantar Flexion 3/5    Right Ankle Inversion 3/5    Right Ankle Eversion 3-/5      Palpation   Palpation  comment tender to palpation along toe extensor tendons/muscles with reproduction of pain      Ambulation/Gait   Gait Pattern Decreased stance time - right;Decreased step length - left;Antalgic                      Objective measurements completed on examination: See above findings.       [facility] Adult PT Treatment/Exercise - 03/01/20 1019      Exercises   Exercises Other Exercises    Other Exercises  see pt instructions                  PT Education - 03/01/20 1206    Education Details HEP    Person(s) Educated Patient    Methods Explanation;Demonstration;Handout    Comprehension Verbalized understanding;Returned demonstration;Need further instruction            PT Short Term Goals - 03/01/20 1209      PT SHORT TERM GOAL #1   Title independent with initial HEP    Status New    Target Date 03/15/20             PT Long Term Goals - 03/01/20 1209      PT LONG TERM GOAL #1   Title Independent with HEP    Status New    Target Date 04/12/20      PT LONG TERM GOAL #2   Title FOTO score improved to 62 for improved function    Status New    Target Date 04/12/20      PT LONG TERM GOAL #3   Title Amb independently without significant deviations for improved function    Status New    Target Date 04/12/20      PT LONG TERM GOAL #4   Title Rt ankle DF AROM improved to at least neutral for improved strength and mobility    Status New    Target Date 04/12/20      PT LONG TERM GOAL #5   Title Improve strength and balance by demonstrating ability to perform SLS on RLE >/= 10 seconds    Status New    Target  Date 04/12/20      Additional Long Term Goals   Additional Long Term Goals Yes      PT LONG TERM GOAL #6   Title Report pain < 4/10 for improved function    Status New    Target Date 04/12/20                  Plan - 03/01/20 1207    Clinical Impression Statement Pt is a 69 y/o female who presents to OPPT for Rt ankle pain.  Pt  demonstrates decreased ROM, strength and balance as well as gait abnormalities affecting functional mobility.  Pt will benefit from PT to address deficits listed.    Personal Factors and Comorbidities Comorbidity 2    Comorbidities HTN, RA    Examination-Activity Limitations Stairs;Stand;Locomotion Level;Lift;Transfers    Examination-Participation Restrictions Occupation;Volunteer;Shop    Stability/Clinical Decision Making Evolving/Moderate complexity    Clinical Decision Making Moderate    Rehab Potential Good    PT Frequency 2x / week   1-2x/wk   PT Duration 6 weeks    PT Treatment/Interventions ADLs/Self Care Home Management;Cryotherapy;Electrical Stimulation;Moist Heat;Iontophoresis 4mg /ml Dexamethasone;Therapeutic exercise;Therapeutic activities;Gait training;Neuromuscular re-education;Balance training;Manual techniques;Patient/family education;Passive range of motion;Dry needling;Taping;Vasopneumatic Device    PT Next Visit Plan review HEP, progress strengthening as able, manual/modalities PRN, possible DN to toe extensors    PT Home Exercise Plan Access Code: [email or web address]    Consulted and Agree with Plan of Care Patient;Family member/caregiver    Family Member Consulted husband           Patient will benefit from skilled therapeutic intervention in order to improve the following deficits and impairments:  Abnormal gait,Decreased strength,Increased muscle spasms,Increased fascial restricitons,Pain,Decreased balance,Decreased mobility,Decreased activity tolerance,Decreased range of motion,Impaired flexibility  Visit Diagnosis: Pain in right ankle and joints of right foot - Plan: PT plan of care cert/re-cert  Stiffness of right ankle, not elsewhere classified - Plan: PT plan of care cert/re-cert  Other abnormalities of gait and mobility - Plan: PT plan of care cert/re-cert  Muscle weakness (generalized) - Plan: PT plan of care cert/re-cert  Unsteadiness on feet - Plan: PT plan of care  cert/re-cert     Problem List Patient Active Problem List   Diagnosis Date Noted  . Pain in right ankle and joints of right foot 02/17/2020  . Rheumatoid arthritis with rheumatoid factor of multiple sites without organ or systems involvement ([place]) 12/30/2019  . High risk medication use 12/30/2019  . Right knee pain 12/03/2019  . Lesion of nose 05/22/2019  . Impaired fasting blood sugar 12/19/2018  . Routine general medical examination at a health care facility 11/15/2015  . Essential hypertension 03/22/2015      Laureen Abrahams, PT, DPT 03/01/20 12:45 PM    [facility] Physical Therapy [address] [place], [place], [address] Phone: [phone]   Fax:  [phone]  Name: Reminisce Cerasoli MRN: [id number] Date of Birth: [birth year]

## 2020-03-08 ENCOUNTER — Telehealth: Payer: Self-pay | Admitting: *Deleted

## 2020-03-08 NOTE — Telephone Encounter (Signed)
Have not heard from appeals which was sent on 02/17/20, refaxed to appeals dept at [phone] for 2nd request.

## 2020-03-14 ENCOUNTER — Ambulatory Visit (INDEPENDENT_AMBULATORY_CARE_PROVIDER_SITE_OTHER): Payer: Medicare HMO | Admitting: Rehabilitative and Restorative Service Providers"

## 2020-03-14 ENCOUNTER — Encounter: Payer: Self-pay | Admitting: Rehabilitative and Restorative Service Providers"

## 2020-03-14 ENCOUNTER — Other Ambulatory Visit: Payer: Self-pay

## 2020-03-14 DIAGNOSIS — M6281 Muscle weakness (generalized): Secondary | ICD-10-CM | POA: Diagnosis not present

## 2020-03-14 DIAGNOSIS — R262 Difficulty in walking, not elsewhere classified: Secondary | ICD-10-CM

## 2020-03-14 DIAGNOSIS — M25671 Stiffness of right ankle, not elsewhere classified: Secondary | ICD-10-CM

## 2020-03-14 DIAGNOSIS — M25571 Pain in right ankle and joints of right foot: Secondary | ICD-10-CM | POA: Diagnosis not present

## 2020-03-14 NOTE — Patient Instructions (Signed)
Added upper heel cords stretch 1-2X/day 5X 20 seconds

## 2020-03-14 NOTE — Therapy (Signed)
[facility] Physical Therapy [address] [place], [place], [address] Phone: [phone]   Fax:  [phone]  Physical Therapy Treatment  Patient Details  Name: Raven Haynes MRN: [id number] Date of Birth: June 21, 1950 Referring Provider (PT): Lavada Mesi, MD   Encounter Date: 03/14/2020   PT End of Session - 03/14/20 1547    Visit Number 2    Number of Visits 12    Date for PT Re-Evaluation 04/12/20    PT Start Time 1303    PT Stop Time 1343    PT Time Calculation (min) 40 min    Activity Tolerance Patient tolerated treatment well;No increased pain    Behavior During Therapy WFL for tasks assessed/performed           Past Medical History:  Diagnosis Date  . Hypertension     Past Surgical History:  Procedure Laterality Date  . KNEE SURGERY Bilateral 2004,2009    There were no vitals filed for this visit.   Subjective Assessment - 03/14/20 1545    Subjective Raven Haynes reports a significant decrease in her pain since starting PT.    Pertinent History RA, HTN    Limitations Standing;Walking    How long can you stand comfortably? variable 5-30 min    Diagnostic tests xrays    Patient Stated Goals improve pain, learn what to do to help self at home until MRI, exercise    Currently in Pain? No/denies    Pain Onset More than a month ago              [facility] PT Assessment - 03/14/20 0001      AROM   Right/Left Ankle Left;Right    Right Ankle Dorsiflexion 2    Left Ankle Dorsiflexion 8                         [facility] Adult PT Treatment/Exercise - 03/14/20 0001      Exercises   Exercises Ankle      Ankle Exercises: Stretches   Other Stretch Seated toe extension & flexion stretch 3X 30 seconds      Ankle Exercises: Standing   Other Standing Ankle Exercises Ankle DF/toe extensor stretch 3X 30 seconds    Other Standing Ankle Exercises Upper HC Box/stretch 1 minute/5X 20 seconds      Ankle Exercises: Seated   ABC's 1 rep    Ankle  Circles/Pumps AROM;Both;15 reps;Other reps (comment)    Ankle Circles/Pumps Limitations 2 sets    Other Seated Ankle Exercises Elongated Adductor Release 2 minutes                  PT Education - 03/14/20 1546    Education Details Reviewed HEP and added heel cords stretching.    Person(s) Educated Patient    Methods Explanation;Demonstration;Verbal cues;Tactile cues;Handout    Comprehension Verbalized understanding;Tactile cues required;Returned demonstration;Need further instruction;Verbal cues required            PT Short Term Goals - 03/14/20 1546      PT SHORT TERM GOAL #1   Title independent with initial HEP    Status Achieved    Target Date 03/15/20             PT Long Term Goals - 03/14/20 1547      PT LONG TERM GOAL #1   Title Independent with HEP    Status On-going      PT LONG TERM GOAL #2   Title FOTO  score improved to 62 for improved function    Status On-going      PT LONG TERM GOAL #3   Title Amb independently without significant deviations for improved function    Status On-going      PT LONG TERM GOAL #4   Title Rt ankle DF AROM improved to at least neutral for improved strength and mobility    Baseline 2 degrees 03/14/2020    Status Achieved      PT LONG TERM GOAL #5   Title Improve strength and balance by demonstrating ability to perform SLS on RLE >/= 10 seconds    Status On-going      PT LONG TERM GOAL #6   Title Report pain < 4/10 for improved function    Status Achieved                 Plan - 03/14/20 1547    Clinical Impression Statement Raven Haynes is very happy with her early PT progress.  She had been in pain for ~3 months before PT and she has been pain-free for 3-4 days.  Added heel cords stretching to addressed improved (but still impaired) heel cords flexibility.  Follow-up in 3-4 weeks to assess her readiness for independent PT.    Personal Factors and Comorbidities Comorbidity 2    Comorbidities HTN, RA     Examination-Activity Limitations Stairs;Stand;Locomotion Level;Lift;Transfers    Examination-Participation Restrictions Occupation;Volunteer;Shop    Stability/Clinical Decision Making Evolving/Moderate complexity    Rehab Potential Good    PT Frequency 2x / week   1-2x/wk   PT Duration 6 weeks    PT Treatment/Interventions ADLs/Self Care Home Management;Cryotherapy;Electrical Stimulation;Moist Heat;Iontophoresis 4mg /ml Dexamethasone;Therapeutic exercise;Therapeutic activities;Gait training;Neuromuscular re-education;Balance training;Manual techniques;Patient/family education;Passive range of motion;Dry needling;Taping;Vasopneumatic Device    PT Next Visit Plan DC?    PT Home Exercise Plan Access Code: [email or web address]    Consulted and Agree with Plan of Care Patient    Family Member Consulted husband           Patient will benefit from skilled therapeutic intervention in order to improve the following deficits and impairments:  Abnormal gait,Decreased strength,Increased muscle spasms,Increased fascial restricitons,Pain,Decreased balance,Decreased mobility,Decreased activity tolerance,Decreased range of motion,Impaired flexibility  Visit Diagnosis: Difficulty walking  Muscle weakness (generalized)  Stiffness of right ankle, not elsewhere classified  Pain in right ankle and joints of right foot     Problem List Patient Active Problem List   Diagnosis Date Noted  . Pain in right ankle and joints of right foot 02/17/2020  . Rheumatoid arthritis with rheumatoid factor of multiple sites without organ or systems involvement ([place]) 12/30/2019  . High risk medication use 12/30/2019  . Right knee pain 12/03/2019  . Lesion of nose 05/22/2019  . Impaired fasting blood sugar 12/19/2018  . Routine general medical examination at a health care facility 11/15/2015  . Essential hypertension 03/22/2015    Farley Ly PT, MPT 03/14/2020, 3:50 PM  [facility] Physical Therapy [address] [place], [place], [address] Phone: [phone]   Fax:  [phone]  Name: Raven Haynes MRN: [id number] Date of Birth: Jun 14, 1950

## 2020-03-18 ENCOUNTER — Encounter: Payer: Self-pay | Admitting: Physical Therapy

## 2020-03-18 ENCOUNTER — Ambulatory Visit: Payer: Medicare HMO | Admitting: Physical Therapy

## 2020-03-18 ENCOUNTER — Other Ambulatory Visit: Payer: Self-pay

## 2020-03-18 DIAGNOSIS — R2681 Unsteadiness on feet: Secondary | ICD-10-CM

## 2020-03-18 DIAGNOSIS — M25671 Stiffness of right ankle, not elsewhere classified: Secondary | ICD-10-CM

## 2020-03-18 DIAGNOSIS — R262 Difficulty in walking, not elsewhere classified: Secondary | ICD-10-CM

## 2020-03-18 DIAGNOSIS — M25571 Pain in right ankle and joints of right foot: Secondary | ICD-10-CM

## 2020-03-18 DIAGNOSIS — R2689 Other abnormalities of gait and mobility: Secondary | ICD-10-CM

## 2020-03-18 DIAGNOSIS — M6281 Muscle weakness (generalized): Secondary | ICD-10-CM

## 2020-03-18 NOTE — Therapy (Addendum)
[place], [place], [address] Phone: [phone]   Fax:  [phone]  Physical Therapy Treatment/Discharge Summary  Patient Details  Name: Raven Haynes MRN: [id number] Date of Birth: 03/07/1951 Referring Provider (PT): Raven Blase, MD   Encounter Date: 03/18/2020   PT End of Session - 03/18/20 1051    Visit Number 3    Number of Visits 12    Date for PT Re-Evaluation 04/12/20    PT Start Time 1013    PT Stop Time 1051    PT Time Calculation (min) 38 min    Activity Tolerance Patient tolerated treatment well;No increased pain    Behavior During Therapy WFL for tasks assessed/performed           Past Medical History:  Diagnosis Date  . Hypertension     Past Surgical History:  Procedure Laterality Date  . KNEE SURGERY Bilateral 2004,2009    There were no vitals filed for this visit.   Subjective Assessment - 03/18/20 1017    Subjective wasn't having any pain but last night had a little pain, today it is fine.    Pertinent History RA, HTN    Limitations Standing;Walking    How long can you stand comfortably? variable 5-30 min    Diagnostic tests xrays    Patient Stated Goals improve pain, learn what to do to help self at home until MRI, exercise    Currently in Pain? No/denies    Pain Onset More than a month ago                             [facility] Adult PT Treatment/Exercise - 03/18/20 1018      Ankle Exercises: Aerobic   Nustep L6 x 6 min      Ankle Exercises: Stretches   Slant Board Stretch 3 reps;30 seconds   gastroc and soleus     Ankle Exercises: Standing   Heel Raises Both;20 reps   on slantboard   Side Shuffle (Round Trip) 3 laps at treadmill      Ankle Exercises: Seated   Other Seated Ankle Exercises ankle 4-way with L5 band x 20 reps                    PT Short Term Goals - 03/14/20 1546      PT SHORT TERM GOAL #1   Title independent with initial HEP     Status Achieved    Target Date 03/15/20             PT Long Term Goals - 03/14/20 1547      PT LONG TERM GOAL #1   Title Independent with HEP    Status On-going      PT LONG TERM GOAL #2   Title FOTO score improved to 62 for improved function    Status On-going      PT LONG TERM GOAL #3   Title Amb independently without significant deviations for improved function    Status On-going      PT LONG TERM GOAL #4   Title Rt ankle DF AROM improved to at least neutral for improved strength and mobility    Baseline 2 degrees 03/14/2020    Status Achieved      PT LONG TERM GOAL #5   Title Improve strength and balance by demonstrating ability to perform SLS on RLE >/= 10 seconds    Status On-going  PT LONG TERM GOAL #6   Title Report pain < 4/10 for improved function    Status Achieved                 Plan - 03/18/20 1051    Clinical Impression Statement Pt continues to report multiple days of no pain, with exception of pain last night that is now fully resolved.  Recommended trial of elliptical at home and will follow up next week to see how she is doing.  Anticipate hold from PT if she continues to do well.    Personal Factors and Comorbidities Comorbidity 2    Comorbidities HTN, RA    Examination-Activity Limitations Stairs;Stand;Locomotion Level;Lift;Transfers    Examination-Participation Restrictions Occupation;Volunteer;Shop    Stability/Clinical Decision Making Evolving/Moderate complexity    Rehab Potential Good    PT Frequency 2x / week   1-2x/wk   PT Duration 6 weeks    PT Treatment/Interventions ADLs/Self Care Home Management;Cryotherapy;Electrical Stimulation;Moist Heat;Iontophoresis 48m/ml Dexamethasone;Therapeutic exercise;Therapeutic activities;Gait training;Neuromuscular re-education;Balance training;Manual techniques;Patient/family education;Passive range of motion;Dry needling;Taping;Vasopneumatic Device    PT Next Visit Plan see how she's doing,  possible hold?    PT Home Exercise Plan Access Code: [email or web address]   Consulted and Agree with Plan of Care Patient    Family Member Consulted husband           Patient will benefit from skilled therapeutic intervention in order to improve the following deficits and impairments:  Abnormal gait,Decreased strength,Increased muscle spasms,Increased fascial restricitons,Pain,Decreased balance,Decreased mobility,Decreased activity tolerance,Decreased range of motion,Impaired flexibility  Visit Diagnosis: Difficulty walking  Muscle weakness (generalized)  Stiffness of right ankle, not elsewhere classified  Pain in right ankle and joints of right foot  Other abnormalities of gait and mobility  Unsteadiness on feet     Problem List Patient Active Problem List   Diagnosis Date Noted  . Pain in right ankle and joints of right foot 02/17/2020  . Rheumatoid arthritis with rheumatoid factor of multiple sites without organ or systems involvement ([place] 12/30/2019  . High risk medication use 12/30/2019  . Right knee pain 12/03/2019  . Lesion of nose 05/22/2019  . Impaired fasting blood sugar 12/19/2018  . Routine general medical examination at a health care facility 11/15/2015  . Essential hypertension 03/22/2015      SLaureen Haynes PT, DPT 03/18/20 10:53 AM    [facility] [address] [address]Phone: [phone]  Fax:  [phone] Name: Raven GorisMRN: [id number] of Birth: [birth year]    PHYSICAL THERAPY DISCHARGE SUMMARY  Visits from Start of Care: 3  Current functional level related to goals / functional outcomes: See above   Remaining deficits: unknown   Education / Equipment: HEP  Plan: Patient agrees to discharge.  Patient goals were not met. Patient is being discharged due to not returning since the last visit.  ?????     SLaureen Haynes PT, DPT 04/28/20 3:22 PM  [facility] [address] [address]Phone: [phone]  Fax:  [phone]

## 2020-03-20 NOTE — Progress Notes (Signed)
Office Visit Note  Patient: Raven Haynes             Date of Birth: March 25, 1950           MRN: [id number]             PCP: Hoyt Koch, MD Referring: Hoyt Koch, * Visit Date: 03/21/2020  Subjective:  Medication Management (Doing well overall)   History of Present Illness: Raven Haynes is a 70 y.o. female here for follow of seropositive, nonerosive RA on methotrexate 15mg  and tapered off prednisone from 5mg  last month. She has been having persistent right ankle pain not improving in relation to her other joints but this improved during the past few weeks without any specific new intervention. DXA scan was obtained last month showing low fracture risk. She now feels much better and has been able to resume walking which was a usual physical activity for her previously.    Review of Systems  Constitutional: Negative for fatigue.  HENT: Negative for mouth sores, mouth dryness and nose dryness.   Eyes: Negative for pain, itching, visual disturbance and dryness.  Respiratory: Negative for cough, hemoptysis, shortness of breath and difficulty breathing.   Cardiovascular: Negative for chest pain, palpitations and swelling in legs/feet.  Gastrointestinal: Negative for abdominal pain, blood in stool, constipation and diarrhea.  Endocrine: Negative for increased urination.  Genitourinary: Negative for painful urination.  Musculoskeletal: Negative for arthralgias, joint pain, joint swelling, myalgias, muscle weakness, morning stiffness, muscle tenderness and myalgias.  Skin: Negative for color change, rash and redness.  Allergic/Immunologic: Negative for susceptible to infections.  Neurological: Negative for dizziness, numbness, headaches, memory loss and weakness.  Hematological: Negative for swollen glands.  Psychiatric/Behavioral: Negative for confusion and sleep disturbance.    PMFS History:  Patient Active Problem List   Diagnosis Date Noted   Pain in right ankle  and joints of right foot 02/17/2020   Rheumatoid arthritis with rheumatoid factor of multiple sites without organ or systems involvement ([place]) 12/30/2019   High risk medication use 12/30/2019   Right knee pain 12/03/2019   Lesion of nose 05/22/2019   Impaired fasting blood sugar 12/19/2018   Routine general medical examination at a health care facility 11/15/2015   Essential hypertension 03/22/2015    Past Medical History:  Diagnosis Date   Hypertension     Family History  Problem Relation Age of Onset   Diabetes Mother    Breast cancer Neg Hx    Past Surgical History:  Procedure Laterality Date   KNEE SURGERY Bilateral 2004,2009   Social History   Social History Narrative   Not on file   Immunization History  Administered Date(s) Administered   Fluad Quad(high Dose 65+) 12/19/2018, 12/04/2019   Influenza, High Dose Seasonal PF 12/10/2016, 12/13/2017   Influenza,inj,Quad PF,6+ Mos 11/15/2015   PFIZER SARS-COV-2 Vaccination 03/31/2019, 04/18/2019   Pneumococcal Conjugate-13 03/22/2015   Pneumococcal Polysaccharide-23 03/10/2014   Tdap 09/09/2011   Zoster 12/24/2013   Zoster Recombinat (Shingrix) 10/04/2017     Objective: Vital Signs: BP 138/84 (BP Location: Left Arm, Patient Position: Sitting, Cuff Size: Normal)    Pulse 70    Ht 5\' 2"  (1.575 m)    Wt 166 lb 6.4 oz (75.5 kg)    BMI 30.43 kg/m    Physical Exam   Musculoskeletal Exam:  Shoulder, elbow, wrist, fingers full range of motion no tenderness or swelling, Right 2nd-3rd MCP thickening, left 2nd MCP thickening appears chronic no warmth, redness, tenderness, no  doppler enhancement on ultrasound Knees, ankles full range of motion no tenderness or swelling, patellofemoral crepitus present   CDAI Exam: CDAI Score: 0.5  Patient Global: 2 mm; Provider Global: 3 mm Swollen: 0 ; Tender: 0  Joint Exam 03/21/2020   All documented joints were normal     Investigation: No additional  findings.  Imaging: DG Bone Density  Result Date: 02/28/2020 Date of study: 02/25/20 Exam: DUAL X-RAY ABSORPTIOMETRY (DXA) FOR BONE MINERAL DENSITY (BMD) Instrument: [employer] Requesting Provider: PCP Indication: follow up for low BMD Comparison: none (please note that it is not possible to compare data from different instruments) Clinical data: Pt is a 69 y.o. female without previous history of fracture. On calcium and vitamin D. Results:  Lumbar spine L1-L4 Femoral neck (FN) 33% distal radius T-score -1.8 RFN: -1.4 LFN: -1.3 n/a Change in BMD from previous DXA test (%) n/a n/a n/a (*) statistically significant Assessment: the BMD is low according to the [facility] classification for osteoporosis (see below). Fracture risk: moderate FRAX score: 10 year major osteoporotic risk: 6.9%. 10 year hip fracture risk: 1.0%. The thresholds for treatment are 20% and 3%, respectively. Comments: the technical quality of the study is good.  L3 is excluded due to degenerative change Evaluation for secondary causes should be considered if clinically indicated. Recommend optimizing calcium (1200 mg/day) and vitamin D (800 IU/day) intake. Followup: Repeat BMD is appropriate after 2 years or after 1-2 years if starting treatment. WHO criteria for diagnosis of osteoporosis in postmenopausal women and in men 79 y/o or older: - normal: T-score -1.0 to + 1.0 - osteopenia/low bone density: T-score between -2.5 and -1.0 - osteoporosis: T-score below -2.5 - severe osteoporosis: T-score below -2.5 with history of fragility fracture Note: although not part of the WHO classification, the presence of a fragility fracture, regardless of the T-score, should be considered diagnostic of osteoporosis, provided other causes for the fracture have been excluded. Treatment: The National Osteoporosis Foundation recommends that treatment be considered in postmenopausal women and men age 37 or older with: 1. Hip or vertebral (clinical or  morphometric) fracture 2. T-score of - 2.5 or lower at the spine or hip 3. 10-year fracture probability by FRAX of at least 20% for a major osteoporotic fracture and 3% for a hip fracturerg Raven Pardon MD    Recent Labs: Lab Results  Component Value Date   WBC 5.9 03/21/2020   HGB 12.6 03/21/2020   PLT 314 03/21/2020   NA 139 03/21/2020   K 3.8 03/21/2020   CL 102 03/21/2020   CO2 29 03/21/2020   GLUCOSE 99 03/21/2020   BUN 13 03/21/2020   CREATININE 0.72 03/21/2020   BILITOT 0.3 03/21/2020   ALKPHOS 65 12/21/2019   AST 16 03/21/2020   ALT 13 03/21/2020   PROT 6.9 03/21/2020   ALBUMIN 4.5 12/21/2019   CALCIUM 9.7 03/21/2020   GFRAA 98 03/21/2020   QFTBGOLDPLUS NEGATIVE 12/30/2019    Speciality Comments: No specialty comments available.  Procedures:  No procedures performed Allergies: Patient has no known allergies.   Assessment / Plan:     Visit Diagnoses: Rheumatoid arthritis with rheumatoid factor of multiple sites without organ or systems involvement ([place]) - Plan: Sedimentation rate, methotrexate (RHEUMATREX) 2.5 MG tablet  Symptoms appear well controlled now disease activity in remission level on methotrexate monotherapy. We will continue MTX 15mg  weekly and f/u in 3 months. Patient is curious about duration of treatment needed, discussed more likely to have refractory or recurrent  symptoms with seropositivity, recommended she should aim to wait for tapering off until remains at remission disease activity for 6 months.  High risk medication use - Plan: CBC with Differential/Platelet, COMPLETE METABOLIC PANEL WITH GFR, folic acid (FOLVITE) 1 MG tablet  Checking CBC, CMP for MTX toxicity monitoring. Continue folic acid 1mg  PO daily. Continuing monitoring recommended at 3 month intervals on stable dose.  Orders: Orders Placed This Encounter  Procedures   Sedimentation rate   CBC with Differential/Platelet   COMPLETE METABOLIC PANEL WITH GFR   Meds ordered this  encounter  Medications   methotrexate (RHEUMATREX) 2.5 MG tablet    Sig: Take 6 tablets (15 mg total) by mouth once a week. Caution:Chemotherapy. Protect from light.    Dispense:  78 tablet    Refill:  0   folic acid (FOLVITE) 1 MG tablet    Sig: Take 1 tablet (1 mg total) by mouth daily.    Dispense:  90 tablet    Refill:  0     Follow-Up Instructions: Return in about 3 months (around 06/19/2020) for RA f/u.   Collier Salina, MD  Note - This record has been created using [employer].  Chart creation errors have been sought, but may not always  have been located. Such creation errors do not reflect on  the standard of medical care.

## 2020-03-21 ENCOUNTER — Ambulatory Visit (INDEPENDENT_AMBULATORY_CARE_PROVIDER_SITE_OTHER): Payer: Medicare HMO | Admitting: Internal Medicine

## 2020-03-21 ENCOUNTER — Encounter: Payer: Self-pay | Admitting: Internal Medicine

## 2020-03-21 ENCOUNTER — Other Ambulatory Visit: Payer: Self-pay

## 2020-03-21 VITALS — BP 138/84 | HR 70 | Ht 62.0 in | Wt 166.4 lb

## 2020-03-21 DIAGNOSIS — M0579 Rheumatoid arthritis with rheumatoid factor of multiple sites without organ or systems involvement: Secondary | ICD-10-CM | POA: Diagnosis not present

## 2020-03-21 DIAGNOSIS — Z79899 Other long term (current) drug therapy: Secondary | ICD-10-CM | POA: Diagnosis not present

## 2020-03-21 MED ORDER — FOLIC ACID 1 MG PO TABS
1.0000 mg | ORAL_TABLET | Freq: Every day | ORAL | 0 refills | Status: DC
Start: 2020-03-21 — End: 2020-10-19

## 2020-03-21 MED ORDER — METHOTREXATE 2.5 MG PO TABS: 15.0000 mg | ORAL_TABLET | ORAL | 0 refills | Status: AC

## 2020-03-22 ENCOUNTER — Encounter: Payer: Medicare HMO | Admitting: Physical Therapy

## 2020-03-22 LAB — CBC WITH DIFFERENTIAL/PLATELET
Absolute Monocytes: 389 cells/uL (ref 200–950)
Basophils Absolute: 30 cells/uL (ref 0–200)
Basophils Relative: 0.5 %
Eosinophils Absolute: 142 cells/uL (ref 15–500)
Eosinophils Relative: 2.4 %
HCT: 37.2 % (ref 35.0–45.0)
Hemoglobin: 12.6 g/dL (ref 11.7–15.5)
Lymphs Abs: 1923 cells/uL (ref 850–3900)
MCH: 29.9 pg (ref 27.0–33.0)
MCHC: 33.9 g/dL (ref 32.0–36.0)
MCV: 88.2 fL (ref 80.0–100.0)
MPV: 10.5 fL (ref 7.5–12.5)
Monocytes Relative: 6.6 %
Neutro Abs: 3416 cells/uL (ref 1500–7800)
Neutrophils Relative %: 57.9 %
Platelets: 314 10*3/uL (ref 140–400)
RBC: 4.22 10*6/uL (ref 3.80–5.10)
RDW: 14.6 % (ref 11.0–15.0)
Total Lymphocyte: 32.6 %
WBC: 5.9 10*3/uL (ref 3.8–10.8)

## 2020-03-22 LAB — COMPLETE METABOLIC PANEL WITH GFR
AG Ratio: 1.8 (calc) (ref 1.0–2.5)
ALT: 13 U/L (ref 6–29)
AST: 16 U/L (ref 10–35)
Albumin: 4.4 g/dL (ref 3.6–5.1)
Alkaline phosphatase (APISO): 63 U/L (ref 37–153)
BUN: 13 mg/dL (ref 7–25)
CO2: 29 mmol/L (ref 20–32)
Calcium: 9.7 mg/dL (ref 8.6–10.4)
Chloride: 102 mmol/L (ref 98–110)
Creat: 0.72 mg/dL (ref 0.60–0.93)
GFR, Est African American: 98 mL/min/{1.73_m2} (ref >=60)
GFR, Est Non African American: 85 mL/min/{1.73_m2} (ref >=60)
Globulin: 2.5 g/dL (calc) (ref 1.9–3.7)
Glucose, Bld: 99 mg/dL (ref 65–99)
Potassium: 3.8 mmol/L (ref 3.5–5.3)
Sodium: 139 mmol/L (ref 135–146)
Total Bilirubin: 0.3 mg/dL (ref 0.2–1.2)
Total Protein: 6.9 g/dL (ref 6.1–8.1)

## 2020-03-22 LAB — SEDIMENTATION RATE: Sed Rate: 11 mm/h (ref 0–30)

## 2020-03-23 NOTE — Progress Notes (Signed)
Lab tests are normal no problems with methotrexate use. The sedimentation rate which has previously been very elevated indicating inflammation is now normal, consistent with her joint pain doing better.

## 2020-03-24 ENCOUNTER — Encounter: Payer: Medicare HMO | Admitting: Physical Therapy

## 2020-03-24 ENCOUNTER — Telehealth: Payer: Self-pay | Admitting: *Deleted

## 2020-03-24 ENCOUNTER — Encounter: Payer: Medicare HMO | Admitting: Rehabilitative and Restorative Service Providers"

## 2020-03-24 NOTE — Telephone Encounter (Signed)
Received appeals letter from [employer] with approval [id number], valid 03/17/20-09/14/20  I called pt and spouse sw spouse and states pt is feeling better with her ankle since PT and may not need the MRI, will cancel this MRI for now, pt has until July 6 if feels still needs MRI.

## 2020-03-28 ENCOUNTER — Encounter: Payer: Medicare HMO | Admitting: Physical Therapy

## 2020-03-30 ENCOUNTER — Encounter: Payer: Medicare HMO | Admitting: Physical Therapy

## 2020-03-30 ENCOUNTER — Telehealth: Payer: Self-pay | Admitting: Physical Therapy

## 2020-03-30 NOTE — Telephone Encounter (Signed)
LVM for pt due to NS for PT appt.  Advised to call office if she needed to cx, and reminded of next appt time.  Laureen Abrahams, PT, DPT 03/30/20 10:39 AM

## 2020-04-04 ENCOUNTER — Encounter: Payer: Medicare HMO | Admitting: Physical Therapy

## 2020-04-06 ENCOUNTER — Encounter: Payer: Medicare HMO | Admitting: Physical Therapy

## 2020-06-01 DIAGNOSIS — H2513 Age-related nuclear cataract, bilateral: Secondary | ICD-10-CM | POA: Diagnosis not present

## 2020-06-01 DIAGNOSIS — H5203 Hypermetropia, bilateral: Secondary | ICD-10-CM | POA: Diagnosis not present

## 2020-06-20 ENCOUNTER — Ambulatory Visit: Payer: Medicare HMO | Admitting: Internal Medicine

## 2020-06-21 NOTE — Progress Notes (Signed)
Office Visit Note  Patient: Raven Haynes             Date of Birth: [birth year]           MRN: [id number]             PCP: Hoyt Koch, MD Referring: Hoyt Koch, * Visit Date: 06/22/2020   Subjective:  Follow-up (Patient feels as if symptoms are well controlled overall with MTX. Patient complains of mild left wrist pain and swelling. Patient has noticed worsening vision and floaters. )   History of Present Illness: Klare Villard is a 70 y.o. 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 Her foot pain remains improved, with some residual hypopigmentation at the right foot. Her knee pain is doing well. Hands still show some swelling or enlargement but no significant pain or stiffness.   Review of Systems  Constitutional: Negative for fatigue.  HENT: Negative for mouth dryness and nose dryness.   Eyes: Negative for pain, itching, visual disturbance and dryness.  Respiratory: Negative for cough, hemoptysis, shortness of breath and difficulty breathing.   Cardiovascular: Negative for chest pain, palpitations and swelling in legs/feet.  Gastrointestinal: Negative for abdominal pain, blood in stool, constipation and diarrhea.  Endocrine: Negative for increased urination.  Genitourinary: Negative for painful urination.  Musculoskeletal: Positive for arthralgias, joint pain and joint swelling. Negative for myalgias, muscle weakness, morning stiffness, muscle tenderness and myalgias.  Skin: Negative for color change, rash and redness.  Allergic/Immunologic: Negative for susceptible to infections.  Neurological: Positive for memory loss. Negative for dizziness, numbness, headaches and weakness.  Hematological: Negative for swollen glands.  Psychiatric/Behavioral: Negative for confusion and sleep disturbance.    PMFS History:  Patient Active Problem List   Diagnosis Date Noted  . Pain in right ankle and joints of  right foot 02/17/2020  . Rheumatoid arthritis with rheumatoid factor of multiple sites without organ or systems involvement ([place]) 12/30/2019  . High risk medication use 12/30/2019  . Right knee pain 12/03/2019  . Lesion of nose 05/22/2019  . Impaired fasting blood sugar 12/19/2018  . Routine general medical examination at a health care facility 11/15/2015  . Essential hypertension 03/22/2015    Past Medical History:  Diagnosis Date  . Hypertension     Family History  Problem Relation Age of Onset  . Diabetes Mother   . Breast cancer Neg Hx    Past Surgical History:  Procedure Laterality Date  . KNEE SURGERY Bilateral 2004,2009   Social History   Social History Narrative  . Not on file   Immunization History  Administered Date(s) Administered  . Fluad Quad(high Dose 65+) 12/19/2018, 12/04/2019  . Influenza, High Dose Seasonal PF 12/10/2016, 12/13/2017  . Influenza,inj,Quad PF,6+ Mos 11/15/2015  . PFIZER(Purple Top)SARS-COV-2 Vaccination 03/31/2019, 04/18/2019  . Pneumococcal Conjugate-13 03/22/2015  . Pneumococcal Polysaccharide-23 03/10/2014  . Tdap 09/09/2011  . Zoster 12/24/2013  . Zoster Recombinat (Shingrix) 10/04/2017     Objective: Vital Signs: BP (!) 159/75 (BP Location: Left Arm, Patient Position: Sitting, Cuff Size: Normal)   Pulse 64   Ht 5' 2" (1.575 m)   Wt 165 lb 12.8 oz (75.2 kg)   BMI 30.33 kg/m    Physical Exam Skin:    General: Skin is warm and dry.     Comments: Pink, hypopigmented patch on right foot dorsum  Neurological:     General: No focal deficit present.  Mental Status: She is alert.  Psychiatric:        Mood and Affect: Mood normal.    Musculoskeletal Exam:  Elbows full ROM no tenderness or swelling Wrists full ROM no tenderness, left wrist slightly swollen Fingers full ROM no tenderness or swelling, ulnar deviation of 2-4th MCPs swelling around right 1st-2nd MCPs Knees full ROM no tenderness or swelling patellofemoral  crepitus b/l Ankles full ROM no tenderness or swelling b/l  CDAI Exam: CDAI Score: 6  Patient Global: 10 mm; Provider Global: 20 mm Swollen: 3 ; Tender: 0  Joint Exam 06/22/2020      Right  Left  Wrist     Swollen   MCP 1  Swollen      MCP 2  Swollen         Investigation: No additional findings.  Imaging: No results found.  Recent Labs: Lab Results  Component Value Date   WBC 5.9 03/21/2020   HGB 12.6 03/21/2020   PLT 314 03/21/2020   NA 139 03/21/2020   K 3.8 03/21/2020   CL 102 03/21/2020   CO2 29 03/21/2020   GLUCOSE 99 03/21/2020   BUN 13 03/21/2020   CREATININE 0.72 03/21/2020   BILITOT 0.3 03/21/2020   ALKPHOS 65 12/21/2019   AST 16 03/21/2020   ALT 13 03/21/2020   PROT 6.9 03/21/2020   ALBUMIN 4.5 12/21/2019   CALCIUM 9.7 03/21/2020   GFRAA 98 03/21/2020   QFTBGOLDPLUS NEGATIVE 12/30/2019    Speciality Comments: No specialty comments available.  Procedures:  No procedures performed Allergies: Patient has no known allergies.   Assessment / Plan:     Visit Diagnoses: Rheumatoid arthritis with rheumatoid factor of multiple sites without organ or systems involvement ([place]) - Plan: Sedimentation rate  Low disease activity by CDAI score today she has minimal pain exam does suggests synovitis still present in few right MCP and left wrist joints although this could be more due to chronic synovial hypertrophy. Repeat ESR today.  She is suggested whether is taking methotrexate as needed so plans to discontinue medicine at this time instructed to closely monitor for change and contact us if she has an increase in symptoms.  High risk medication use - Plan: CBC with Differential/Platelet, COMPLETE METABOLIC PANEL WITH GFR  Methotrexate toxicity monitoring today checking CBC and CMP.  Orders: Orders Placed This Encounter  Procedures  . CBC with Differential/Platelet  . COMPLETE METABOLIC PANEL WITH GFR  . Sedimentation rate   No orders of the defined  types were placed in this encounter.    Follow-Up Instructions: Return in about 3 months (around 09/21/2020) for RA f/u on MTX - Maybe stopping.   Collier Salina, MD  Note - This record has been created using [employer].  Chart creation errors have been sought, but may not always  have been located. Such creation errors do not reflect on  the standard of medical care.

## 2020-06-22 ENCOUNTER — Other Ambulatory Visit: Payer: Self-pay

## 2020-06-22 ENCOUNTER — Ambulatory Visit: Payer: Medicare HMO | Admitting: Internal Medicine

## 2020-06-22 ENCOUNTER — Encounter: Payer: Self-pay | Admitting: Internal Medicine

## 2020-06-22 VITALS — BP 159/75 | HR 64 | Ht 62.0 in | Wt 165.8 lb

## 2020-06-22 DIAGNOSIS — M0579 Rheumatoid arthritis with rheumatoid factor of multiple sites without organ or systems involvement: Secondary | ICD-10-CM | POA: Diagnosis not present

## 2020-06-22 DIAGNOSIS — Z79899 Other long term (current) drug therapy: Secondary | ICD-10-CM | POA: Diagnosis not present

## 2020-06-22 NOTE — Patient Instructions (Signed)
We are checking again inflammation markets also blood count and liver function tests for methotrexate monitoring. You may try stopping the methotrexate at this time to see if symptoms stay under control with that. If symptoms worsen again please contact us by phone or message and likely can resume the same medication and follow up. If symptoms improve I would also like to follow up at least once more for monitoring this.

## 2020-06-23 LAB — CBC WITH DIFFERENTIAL/PLATELET
Absolute Monocytes: 455 cells/uL (ref 200–950)
Basophils Absolute: 30 cells/uL (ref 0–200)
Basophils Relative: 0.6 %
Eosinophils Absolute: 200 cells/uL (ref 15–500)
Eosinophils Relative: 4 %
HCT: 36.2 % (ref 35.0–45.0)
Hemoglobin: 11.9 g/dL (ref 11.7–15.5)
Lymphs Abs: 1590 cells/uL (ref 850–3900)
MCH: 30.4 pg (ref 27.0–33.0)
MCHC: 32.9 g/dL (ref 32.0–36.0)
MCV: 92.3 fL (ref 80.0–100.0)
MPV: 10.2 fL (ref 7.5–12.5)
Monocytes Relative: 9.1 %
Neutro Abs: 2725 cells/uL (ref 1500–7800)
Neutrophils Relative %: 54.5 %
Platelets: 275 10*3/uL (ref 140–400)
RBC: 3.92 10*6/uL (ref 3.80–5.10)
RDW: 13 % (ref 11.0–15.0)
Total Lymphocyte: 31.8 %
WBC: 5 10*3/uL (ref 3.8–10.8)

## 2020-06-23 LAB — COMPLETE METABOLIC PANEL WITH GFR
AG Ratio: 1.8 (calc) (ref 1.0–2.5)
ALT: 13 U/L (ref 6–29)
AST: 18 U/L (ref 10–35)
Albumin: 4.2 g/dL (ref 3.6–5.1)
Alkaline phosphatase (APISO): 62 U/L (ref 37–153)
BUN: 12 mg/dL (ref 7–25)
CO2: 26 mmol/L (ref 20–32)
Calcium: 9.6 mg/dL (ref 8.6–10.4)
Chloride: 105 mmol/L (ref 98–110)
Creat: 0.75 mg/dL (ref 0.60–0.93)
GFR, Est African American: 93 mL/min/{1.73_m2} (ref >=60)
GFR, Est Non African American: 80 mL/min/{1.73_m2} (ref >=60)
Globulin: 2.4 g/dL (calc) (ref 1.9–3.7)
Glucose, Bld: 111 mg/dL — ABNORMAL HIGH (ref 65–99)
Potassium: 4 mmol/L (ref 3.5–5.3)
Sodium: 139 mmol/L (ref 135–146)
Total Bilirubin: 0.3 mg/dL (ref 0.2–1.2)
Total Protein: 6.6 g/dL (ref 6.1–8.1)

## 2020-06-23 LAB — SEDIMENTATION RATE: Sed Rate: 9 mm/h (ref 0–30)

## 2020-06-24 DIAGNOSIS — E669 Obesity, unspecified: Secondary | ICD-10-CM | POA: Diagnosis not present

## 2020-06-24 DIAGNOSIS — Z833 Family history of diabetes mellitus: Secondary | ICD-10-CM | POA: Diagnosis not present

## 2020-06-24 DIAGNOSIS — I10 Essential (primary) hypertension: Secondary | ICD-10-CM | POA: Diagnosis not present

## 2020-06-24 DIAGNOSIS — Z79899 Other long term (current) drug therapy: Secondary | ICD-10-CM | POA: Diagnosis not present

## 2020-06-24 DIAGNOSIS — M069 Rheumatoid arthritis, unspecified: Secondary | ICD-10-CM | POA: Diagnosis not present

## 2020-06-24 DIAGNOSIS — M8580 Other specified disorders of bone density and structure, unspecified site: Secondary | ICD-10-CM | POA: Diagnosis not present

## 2020-06-24 DIAGNOSIS — Z6830 Body mass index (BMI) 30.0-30.9, adult: Secondary | ICD-10-CM | POA: Diagnosis not present

## 2020-07-08 ENCOUNTER — Other Ambulatory Visit: Payer: Self-pay

## 2020-07-08 ENCOUNTER — Other Ambulatory Visit: Payer: Self-pay | Admitting: Internal Medicine

## 2020-07-08 DIAGNOSIS — M0579 Rheumatoid arthritis with rheumatoid factor of multiple sites without organ or systems involvement: Secondary | ICD-10-CM

## 2020-07-08 MED ORDER — METHOTREXATE SODIUM 15 MG PO TABS
15.0000 mg | ORAL_TABLET | ORAL | 2 refills | Status: DC
Start: 2020-07-08 — End: 2020-10-19

## 2020-07-08 NOTE — Telephone Encounter (Signed)
Lab results are normal with no need to change mediation. Sedimentation rate is normal indicating inflammation was probably under control. I can reorder medication although she had discussed an intent to stop taking this and see if symptoms stayed well controlled.

## 2020-07-08 NOTE — Telephone Encounter (Signed)
Patient called requesting prescription refill of Methotrexate to be sent to [facility].    Patient also requested a return call to discuss labwork results.

## 2020-07-08 NOTE — Telephone Encounter (Signed)
Next Visit: 09/21/2020  Last Visit: 06/22/2020  DX: Rheumatoid arthritis with rheumatoid factor of multiple sites without organ or systems involvement  Current Dose per office note 06/22/2020: MTX 15 mg PO weekly  Labs: 06/22/2020- sed rate, CMP, CBC  Okay to refill MTX?

## 2020-07-13 ENCOUNTER — Telehealth: Payer: Self-pay | Admitting: Radiology

## 2020-07-13 NOTE — Telephone Encounter (Signed)
Patient called regarding MTX Rx, Rx was supposed to be reduced per patient. What should dosage be now? New Rx can be sent once dose is determined. Thanks!

## 2020-07-13 NOTE — Telephone Encounter (Signed)
FYI- I spoke with Raven Haynes the plan is to discontinue methotrexate since her disease is well controlled and inflammatory markers were down to normal at the last visit. I recommended her to contact us if symptoms flare or has a problem with this and she has medication on hand to resume treatment if needed.

## 2020-07-27 ENCOUNTER — Other Ambulatory Visit: Payer: Self-pay

## 2020-07-27 ENCOUNTER — Ambulatory Visit: Payer: Medicare HMO | Admitting: Podiatry

## 2020-07-27 DIAGNOSIS — M7741 Metatarsalgia, right foot: Secondary | ICD-10-CM

## 2020-07-27 DIAGNOSIS — G5763 Lesion of plantar nerve, bilateral lower limbs: Secondary | ICD-10-CM

## 2020-07-27 DIAGNOSIS — M7742 Metatarsalgia, left foot: Secondary | ICD-10-CM | POA: Diagnosis not present

## 2020-07-27 MED ORDER — METHYLPREDNISOLONE 4 MG PO TBPK: ORAL_TABLET | ORAL | 0 refills | Status: DC

## 2020-07-27 MED ORDER — GABAPENTIN 100 MG PO CAPS: 100.0000 mg | ORAL_CAPSULE | Freq: Three times a day (TID) | ORAL | 1 refills | Status: DC

## 2020-07-27 NOTE — Progress Notes (Signed)
   HPI: 70 y.o. female presenting today for evaluation of bilateral foot pain is been going on for about 1 year now.  Patient states that it feels as if she is walking on marbles.  It is exacerbated when she goes barefoot.  She has not done anything for treatment.  She denies a history of injury.  Past Medical History:  Diagnosis Date  . Hypertension      Physical Exam: General: The patient is alert and oriented x3 in no acute distress.  Dermatology: Skin is warm, dry and supple bilateral lower extremities. Negative for open lesions or macerations.  Vascular: Palpable pedal pulses bilaterally. No edema or erythema noted. Capillary refill within normal limits.  Neurological: Epicritic and protective threshold grossly intact bilaterally.  Paresthesia with numbness and sharp shooting pains to the bilateral forefoot.  Musculoskeletal Exam: Range of motion within normal limits to all pedal and ankle joints bilateral. Muscle strength 5/5 in all groups bilateral.  Pain on palpation to the second intermetatarsal space left foot with toe splay.  Consistent with a Morton's neuroma  Radiographic Exam:  Normal osseous mineralization. Joint spaces preserved. No fracture/dislocation/boney destruction.    Assessment: 1.  Morton's neuroma/metatarsalgia bilateral   Plan of Care:  1. Patient evaluated. X-Rays reviewed.  2.  Prescription for Medrol Dosepak 3.  Prescription for gabapentin 100 mg 3 times daily 4.  Recommend not going barefoot.  Recommend good supportive shoes or sneakers 5.  Return to clinic in 4 weeks      Edrick Kins, DPM [facility]  Dr. Edrick Kins, DPM    [address], [place] [address]                Office [phone]  Fax [phone]

## 2020-07-28 ENCOUNTER — Telehealth: Payer: Self-pay | Admitting: Internal Medicine

## 2020-07-28 NOTE — Progress Notes (Signed)
  Chronic Care Management   Note  07/28/2020 Name: Raven Haynes MRN: [id number] DOB: February 05, 1951  Raven Haynes is a 70 y.o. year old female who is a primary care patient of Hoyt Koch, MD. I reached out to [place] by phone today in response to a referral sent by Raven Haynes PCP, Hoyt Koch, MD.   Raven Haynes was given information about Chronic Care Management services today including:  1. CCM service includes personalized support from designated clinical staff supervised by her physician, including individualized plan of care and coordination with other care providers 2. 24/7 contact phone numbers for assistance for urgent and routine care needs. 3. Service will only be billed when office clinical staff spend 20 minutes or more in a month to coordinate care. 4. Only one practitioner may furnish and bill the service in a calendar month. 5. The patient may stop CCM services at any time (effective at the end of the month) by phone call to the office staff.   Patient agreed to services and verbal consent obtained.   Follow up plan:   [place]

## 2020-08-22 NOTE — Progress Notes (Signed)
Chronic Care Management Pharmacy Note  08/23/2020 Name:  Raven Haynes MRN:  [id number] DOB:  1950/11/08  Summary: - Patient reports that she has stopped methotrexate and folic acid at recommendation of rheumatologist - has follow up next month to discuss status of RA - Started gabapentin - prescribed by podiatrist- has follow up next week to discuss  - Has been taking losartan and HCTZ for BP control - no issues or concerns   Recommendations/Changes made from today's visit: Recommending no changes at this time   Subjective: Raven Haynes is an 70 y.o. year old female who is a primary patient of Hoyt Koch, MD.  The CCM team was consulted for assistance with disease management and care coordination needs.    Engaged with patient by telephone for initial visit in response to provider referral for pharmacy case management and/or care coordination services.   Consent to Services:  The patient was given the following information about Chronic Care Management services today, agreed to services, and gave verbal consent: 1. CCM service includes personalized support from designated clinical staff supervised by the primary care provider, including individualized plan of care and coordination with other care providers 2. 24/7 contact phone numbers for assistance for urgent and routine care needs. 3. Service will only be billed when office clinical staff spend 20 minutes or more in a month to coordinate care. 4. Only one practitioner may furnish and bill the service in a calendar month. 5.The patient may stop CCM services at any time (effective at the end of the month) by phone call to the office staff. 6. The patient will be responsible for cost sharing (co-pay) of up to 20% of the service fee (after annual deductible is met). Patient agreed to services and consent obtained.  Patient Care Team: Hoyt Koch, MD as PCP - General (Internal Medicine) Delice Bison Darnelle Maffucci, [facility] as Pharmacist  (Pharmacist)  Recent office visits: 02/23/2020 - PCP visit - annual visit - BP well controlled - no changes to medication - foot and ankle pain - patient to follow up with ortho about   Recent consult visits: 07/27/2020 - Dr. Amalia Hailey - Podiatry - bilateral foot pain - pain described as if she if walking on marbles - Morton's neuroma/metatarsalgia bilateral  - given medrol dose pak and started on gabapentin   06/22/2020 - Dr. Benjamine Mola - Rheumatology - evaluation of RA since last check up, noted low disease activity - recommended for patient to trial hold of MTX at this time to see if RA can be controlled without - patient to contact office if she needs to restart  06/01/2020 - Dr. Prudencio Burly - Opthalmology  03/21/2020 - Dr. Benjamine Mola - Rheumatology - evaluation of RA - patient doing well with MTX and folic acid regimen - plan will be to continue at this time and follow up in 3 months to discuss possible medication changes at that time  01/29/2020 - Dr Junius Roads - Ortho - evaluation for persistent right ankle pain - MRI ordered   Hospital visits: None in previous 6 months  Objective:  Lab Results  Component Value Date   CREATININE 0.75 06/22/2020   BUN 12 06/22/2020   GFR 87.44 12/21/2019   GFRNONAA 80 06/22/2020   GFRAA 93 06/22/2020   NA 139 06/22/2020   K 4.0 06/22/2020   CALCIUM 9.6 06/22/2020   CO2 26 06/22/2020   GLUCOSE 111 (H) 06/22/2020    Lab Results  Component Value Date/Time   HGBA1C 6.1 02/23/2020 09:37  AM   HGBA1C 5.6 05/22/2019 09:18 AM   HGBA1C 6.5 12/19/2018 08:27 AM   GFR 87.44 12/21/2019 02:40 PM   GFR 77.93 12/18/2019 08:50 AM    Last diabetic Eye exam:  No results found for: HMDIABEYEEXA  Last diabetic Foot exam:  No results found for: HMDIABFOOTEX   Lab Results  Component Value Date   CHOL 170 02/23/2020   HDL 53.10 02/23/2020   LDLCALC 95 02/23/2020   TRIG 111.0 02/23/2020   CHOLHDL 3 02/23/2020    Hepatic Function Latest Ref Rng & Units 06/22/2020 03/21/2020  01/20/2020  Total Protein 6.1 - 8.1 g/dL 6.6 6.9 6.9  Albumin 3.5 - 5.2 g/dL - - -  AST 10 - 35 U/L '18 16 14  ' ALT 6 - 29 U/L '13 13 12  ' Alk Phosphatase 39 - 117 U/L - - -  Total Bilirubin 0.2 - 1.2 mg/dL 0.3 0.3 0.4    No results found for: TSH, FREET4  CBC Latest Ref Rng & Units 06/22/2020 03/21/2020 01/20/2020  WBC 3.8 - 10.8 Thousand/uL 5.0 5.9 6.7  Hemoglobin 11.7 - 15.5 g/dL 11.9 12.6 12.2  Hematocrit 35.0 - 45.0 % 36.2 37.2 36.7  Platelets 140 - 400 Thousand/uL 275 314 271    No results found for: VD25OH  Clinical ASCVD: No  The 10-year ASCVD risk score Mikey Bussing DC Jr., et al., 2013) is: 20.1%   Values used to calculate the score:     Age: 61 years     Sex: Female     Is Non-Hispanic African American: No     Diabetic: No     Tobacco smoker: No     Systolic Blood Pressure: 409 mmHg     Is BP treated: Yes     HDL Cholesterol: 53.1 mg/dL     Total Cholesterol: 170 mg/dL    Depression screen [facility] 2/9 02/23/2020 02/23/2020 12/19/2018  Decreased Interest 0 0 0  Down, Depressed, Hopeless 0 1 0  PHQ - 2 Score 0 1 0     Social History   Tobacco Use  Smoking Status Never  Smokeless Tobacco Never   BP Readings from Last 3 Encounters:  06/22/20 (!) 159/75  03/21/20 138/84  02/23/20 130/80   Pulse Readings from Last 3 Encounters:  06/22/20 64  03/21/20 70  02/23/20 61   Wt Readings from Last 3 Encounters:  06/22/20 165 lb 12.8 oz (75.2 kg)  03/21/20 166 lb 6.4 oz (75.5 kg)  02/23/20 164 lb 4.8 oz (74.5 kg)   BMI Readings from Last 3 Encounters:  06/22/20 30.33 kg/m  03/21/20 30.43 kg/m  02/23/20 30.05 kg/m    Assessment/Interventions: Review of patient past medical history, allergies, medications, health status, including review of consultants reports, laboratory and other test data, was performed as part of comprehensive evaluation and provision of chronic care management services.   SDOH:  (Social Determinants of Health) assessments and interventions  performed: Yes  SDOH Screenings   Alcohol Screen: Low Risk    Last Alcohol Screening Score (AUDIT): 0  Depression (PHQ2-9): Low Risk    PHQ-2 Score: 0  Financial Resource Strain: Low Risk    Difficulty of Paying Living Expenses: Not hard at all  Food Insecurity: No Food Insecurity   Worried About Charity fundraiser in the Last Year: Never true   Ran Out of Food in the Last Year: Never true  Housing: Low Risk    Last Housing Risk Score: 0  Physical Activity: Sufficiently Active  Days of Exercise per Week: 5 days   Minutes of Exercise per Session: 30 min  Social Connections: Not on file  Stress: No Stress Concern Present   Feeling of Stress : Not at all  Tobacco Use: Low Risk    Smoking Tobacco Use: Never   Smokeless Tobacco Use: Never  Transportation Needs: No Transportation Needs   Lack of Transportation (Medical): No   Lack of Transportation (Non-Medical): No    CCM Care Plan  No Known Allergies  Medications Reviewed Today     Reviewed by Lind Guest, CMA (Certified Medical Assistant) on 07/27/20 at [place] List Status: <None>   Medication Order Taking? Sig Documenting Provider Last Dose Status Informant  B Complex-C (SUPER B COMPLEX PO) [id number] No Take by mouth. [provider] Taking Active   Calcium Citrate-Vitamin D (CITRACAL + D PO) [id number] No Take by mouth. [provider] Taking Active   Cholecalciferol (D3-1000 PO) [id number] No Take by mouth. [provider] Taking Active   Cyanocobalamin (VITAMIN B 12 PO) [id number] No Take by mouth. [provider] Taking Active   folic acid (FOLVITE) 1 MG tablet [id number] No Take 1 tablet (1 mg total) by mouth daily. Collier Salina, MD Taking Active   hydrochlorothiazide (HYDRODIURIL) 25 MG tablet [id number] No Take 1 tablet (25 mg total) by mouth daily. Binnie Rail, MD Taking Active   losartan (COZAAR) 25 MG tablet [id number]  TAKE ONE TABLET BY MOUTH ONE TIME DAILY  Hoyt Koch, MD  Active   Magnesium 400 MG CAPS [id number] No Take by mouth. [provider] Taking Active   methotrexate (RHEUMATREX) 15 MG tablet [id number]  Take 1 tablet (15 mg total) by mouth once a week. Caution: Chemotherapy. Protect from light. Collier Salina, MD  Active   Red Yeast Rice Extract (RED YEAST RICE PO) [id number] No Take by mouth. [provider] Taking Active   TURMERIC PO [id number] No Take by mouth. [provider] Taking Active   Zinc Sulfate (ZINC 15 PO) [id number] No Take by mouth. [provider] Taking Active             Patient Active Problem List   Diagnosis Date Noted   Pain in right ankle and joints of right foot 02/17/2020   Rheumatoid arthritis with rheumatoid factor of multiple sites without organ or systems involvement ([place]) 12/30/2019   High risk medication use 12/30/2019   Right knee pain 12/03/2019   Lesion of nose 05/22/2019   Impaired fasting blood sugar 12/19/2018   Routine general medical examination at a health care facility 11/15/2015   Essential hypertension 03/22/2015    Immunization History  Administered Date(s) Administered   Fluad Quad(high Dose 65+) 12/19/2018, 12/04/2019   Influenza, High Dose Seasonal PF 12/10/2016, 12/13/2017   Influenza,inj,Quad PF,6+ Mos 11/15/2015   PFIZER(Purple Top)SARS-COV-2 Vaccination 03/31/2019, 04/18/2019   Pneumococcal Conjugate-13 03/22/2015   Pneumococcal Polysaccharide-23 03/10/2014   Tdap 09/09/2011   Zoster Recombinat (Shingrix) 10/04/2017   Zoster, Live 12/24/2013    Conditions to be addressed/monitored:  Hypertension, Prediabetes, and Rheumatoid Arthritis  Care Plan : CCM Care Plan  Updates made by [name], RPH since 08/23/2020 12:00 AM     Problem: HTN, prediabetes, RA, Chronic pain   Priority: High  Onset Date: 08/23/2020     Long-Range Goal: Disease Management   Start Date: 08/23/2020  Expected End Date: 02/22/2021  This  Visit's Progress: On track  Priority: High  Note:  Current Barriers:  Unable to independently monitor therapeutic efficacy  Pharmacist Clinical Goal(s):  Patient will maintain control of blood pressure as evidenced by blood pressure logs  through collaboration with PharmD and provider.   Interventions: 1:1 collaboration with Hoyt Koch, MD regarding development and update of comprehensive plan of care as evidenced by provider attestation and co-signature Inter-disciplinary care team collaboration (see longitudinal plan of care) Comprehensive medication review performed; medication list updated in electronic medical record  Hypertension (BP goal <140/90) -Controlled -Current treatment: Hydrochlorothiazide 63m daily  Losartan 273mdaily  -Medications previously tried: n/a  -Current home readings: averaging 130-140/ 70-85 -Current dietary habits: reports that she only drinks coffee in AM, cognizant of salt intake  -Current exercise habits: walking, elliptical, and weight lifting  - trying to exercise every other day  -Denies hypotensive/hypertensive symptoms -Educated on BP goals and benefits of medications for prevention of heart attack, stroke and kidney damage; Daily salt intake goal < 2300 mg; Exercise goal of 150 minutes per week; Importance of home blood pressure monitoring; Proper BP monitoring technique; Symptoms of hypotension and importance of maintaining adequate hydration; -Counseled to monitor BP at home twice daily, document, and provide log at future appointments -Counseled on diet and exercise extensively Recommended to continue current medication  Prediabetes (A1c goal <6.5%) -Controlled (diet) - Last A1c 6.1% (02/23/2020) Medications previously tried: n/a  -Denies hypoglycemic/hyperglycemic symptoms -Current meal patterns:  breakfast: eggs,fresh fruit, toast   lunch: salad, fruit  dinner: chicken, tu[place]uwaitfish, vegetables  snacks: apples, mango,  cherries, blueberries,  drinks: water, coffee in the AM, pomegranate juice  -Current exercise: walking, using elliptical, or weight training about every day/ every other day -Educated on A1c and blood sugar goals; Complications of diabetes including kidney damage, retinal damage, and cardiovascular disease; Exercise goal of 150 minutes per week; Benefits of weight loss; -Counseled to check feet daily and get yearly eye exams -Counseled on diet and exercise extensively   Chronic Pain (right ankle) / Rheumatoid arthritis (hands) (Goal: pain control / prevention of flares) -Controlled -Current treatment  Gabapentin 10055m 1 capsule 3 times daily - has follow up with Dr. EvaAmalia Haileyxt week to discuss  Methotrexate 28m60m 1 tablet once weekly - not currently taking (has follow up with Rheumatologist 7/202/2482lic acid 1mg 63mly - not currently taking - not currently taking (has follow up with Rheumatologist 09/2020)  -Medications previously tried: meloxicam, tramadol, hydrocodone  -Recommended to continue current medication  Health Maintenance -Vaccine gaps: Shingles vaccine / COVID booster  -Current therapy:  Vitamin B complex with vitamin C - once daily  Turmeric 1000mg 95m daily  Calcium Citrate- Vit D - 2 tablets daily  Vit D3 - 5000 units daily  Vit B12 1000mcg 25mtablet daily  Magnesium 400mg da39m Red Yeast Rice extract  - 600mg dai44mMove Free (Glucosamine - Chondroitin)  Zinc Sulfate 50 mg daily  Apple Cider Vinegar 600mg dail78mEducated on Herbal supplement research is limited and benefits usually cannot be proven Cost vs benefit of each product must be carefully weighed by individual consumer Supplements may interfere with prescription drugs -Patient is satisfied with current therapy and denies issues -Recommended to continue current medication  Patient Goals/Self-Care Activities Patient will:  - take medications as prescribed check blood pressure at least once  daily (patient has been checking twice daily), document, and provide at future appointments  Follow Up Plan: Telephone follow up appointment with care management team member scheduled  for: The patient has been provided with contact information for the care management team and has been advised to call with any health related questions or concerns.        Medication Assistance: None required.  Patient affirms current coverage meets needs.  Compliance/Adherence/Medication fill history: Care Gaps: Due for COVID19 booster (3rd shot)  Patient's preferred pharmacy is:  [facility] [address], [place] [address] [place] [address] Phone: [phone] Fax: [phone]   Uses pill box? Yes Pt endorses 100% compliance  Care Plan and Follow Up Patient Decision:  Patient agrees to Care Plan and Follow-up.  Plan: Telephone follow up appointment with care management team member scheduled for:  3 months and The patient has been provided with contact information for the care management team and has been advised to call with any health related questions or concerns.   [name], PharmD Clinical Pharmacist, [place]  08/23/20 4:45 PM

## 2020-08-23 ENCOUNTER — Ambulatory Visit (INDEPENDENT_AMBULATORY_CARE_PROVIDER_SITE_OTHER): Payer: Medicare HMO

## 2020-08-23 ENCOUNTER — Other Ambulatory Visit: Payer: Self-pay

## 2020-08-23 DIAGNOSIS — I10 Essential (primary) hypertension: Secondary | ICD-10-CM | POA: Diagnosis not present

## 2020-08-23 DIAGNOSIS — M0579 Rheumatoid arthritis with rheumatoid factor of multiple sites without organ or systems involvement: Secondary | ICD-10-CM

## 2020-08-23 DIAGNOSIS — M25561 Pain in right knee: Secondary | ICD-10-CM

## 2020-08-23 DIAGNOSIS — R7301 Impaired fasting glucose: Secondary | ICD-10-CM

## 2020-08-23 DIAGNOSIS — M25571 Pain in right ankle and joints of right foot: Secondary | ICD-10-CM

## 2020-08-23 NOTE — Patient Instructions (Signed)
Visit Information   PATIENT GOALS:   Goals Addressed             This Visit's Progress    Manage My Medicine       Timeframe:  Long-Range Goal Priority:  Medium Start Date:   08/23/2020                         Expected End Date:   02/22/2021                    Follow Up Date 11/23/2020    - call for medicine refill 2 or 3 days before it runs out - keep a list of all the medicines I take; vitamins and herbals too    Why is this important?   These steps will help you keep on track with your medicines.   Notes: Patient to keep clinic updated on supplements that she starts      Track and Manage My Blood Pressure-Hypertension       Timeframe:  Long-Range Goal Priority:  High Start Date: 08/23/2020                            Expected End Date: 02/22/2021                     Follow Up Date 11/23/2020    - check blood pressure daily - choose a place to take my blood pressure (home, clinic or office, retail store) - write blood pressure results in a log or diary    Why is this important?   You won't feel high blood pressure, but it can still hurt your blood vessels.  High blood pressure can cause heart or kidney problems. It can also cause a stroke.  Making lifestyle changes like losing a little weight or eating less salt will help.  Checking your blood pressure at home and at different times of the day can help to control blood pressure.  If the doctor prescribes medicine remember to take it the way the doctor ordered.  Call the office if you cannot afford the medicine or if there are questions about it.     Notes: Patient to call office should blood pressure be uncontrolled (>140/90) or if she is having issues with hypotension          Consent to CCM Services: Raven Haynes was given information about Chronic Care Management services today including:  CCM service includes personalized support from designated clinical staff supervised by her physician, including  individualized plan of care and coordination with other care providers 24/7 contact phone numbers for assistance for urgent and routine care needs. Service will only be billed when office clinical staff spend 20 minutes or more in a month to coordinate care. Only one practitioner may furnish and bill the service in a calendar month. The patient may stop CCM services at any time (effective at the end of the month) by phone call to the office staff. The patient will be responsible for cost sharing (co-pay) of up to 20% of the service fee (after annual deductible is met).  Patient agreed to services and verbal consent obtained.   Patient verbalizes understanding of instructions provided today and agrees to view in [place].   Telephone follow up appointment with care management team member scheduled for: The patient has been provided with contact information for the care management team  and has been advised to call with any health related questions or concerns.   Raven Haynes, PharmD Clinical Pharmacist, [place]    CLINICAL CARE PLAN: Patient Care Plan: CCM Care Plan     Problem Identified: HTN, prediabetes, RA, Chronic pain   Priority: High  Onset Date: 08/23/2020     Long-Range Goal: Disease Management   Start Date: 08/23/2020  Expected End Date: 02/22/2021  This Visit's Progress: On track  Priority: High  Note:   Current Barriers:  Unable to independently monitor therapeutic efficacy  Pharmacist Clinical Goal(s):  Patient will maintain control of blood pressure as evidenced by blood pressure logs  through collaboration with PharmD and provider.   Interventions: 1:1 collaboration with Raven Koch, MD regarding development and update of comprehensive plan of care as evidenced by provider attestation and co-signature Inter-disciplinary care team collaboration (see longitudinal plan of care) Comprehensive medication review performed; medication list  updated in electronic medical record  Hypertension (BP goal <140/90) -Controlled -Current treatment: Hydrochlorothiazide 43m daily  Losartan 278mdaily  -Medications previously tried: n/a  -Current home readings: averaging 130-140/ 70-85 -Current dietary habits: reports that she only drinks coffee in AM, cognizant of salt intake  -Current exercise habits: walking, elliptical, and weight lifting  - trying to exercise every other day  -Denies hypotensive/hypertensive symptoms -Educated on BP goals and benefits of medications for prevention of heart attack, stroke and kidney damage; Daily salt intake goal < 2300 mg; Exercise goal of 150 minutes per week; Importance of home blood pressure monitoring; Proper BP monitoring technique; Symptoms of hypotension and importance of maintaining adequate hydration; -Counseled to monitor BP at home twice daily, document, and provide log at future appointments -Counseled on diet and exercise extensively Recommended to continue current medication  Prediabetes (A1c goal <6.5%) -Controlled (diet) - Last A1c 6.1% (02/23/2020) Medications previously tried: n/a  -Denies hypoglycemic/hyperglycemic symptoms -Current meal patterns:  breakfast: eggs,fresh fruit, toast   lunch: salad, fruit  dinner: chicken, tu[place]fish, vegetables  snacks: apples, mango, cherries, blueberries,  drinks: water, coffee in the AM, pomegranate juice  -Current exercise: walking, using elliptical, or weight training about every day/ every other day -Educated on A1c and blood sugar goals; Complications of diabetes including kidney damage, retinal damage, and cardiovascular disease; Exercise goal of 150 minutes per week; Benefits of weight loss; -Counseled to check feet daily and get yearly eye exams -Counseled on diet and exercise extensively   Chronic Pain (right ankle) / Rheumatoid arthritis (hands) (Goal: pain control / prevention of flares) -Controlled -Current  treatment  Gabapentin 10048m 1 capsule 3 times daily - has follow up with Dr. EvaAmalia Haileyxt week to discuss  Methotrexate 4m8m 1 tablet once weekly - not currently taking (has follow up with Rheumatologist 7/203/7342lic acid 1mg 49mly - not currently taking - not currently taking (has follow up with Rheumatologist 09/2020)  -Medications previously tried: meloxicam, tramadol, hydrocodone  -Recommended to continue current medication  Health Maintenance -Vaccine gaps: Shingles vaccine / COVID booster  -Current therapy:  Vitamin B complex with vitamin C - once daily  Turmeric 1000mg 57m daily  Calcium Citrate- Vit D - 2 tablets daily  Vit D3 - 5000 units daily  Vit B12 1000mcg 25mtablet daily  Magnesium 400mg da40m Red Yeast Rice extract  - 600mg dai62mMove Free (Glucosamine - Chondroitin)  Zinc Sulfate 50 mg daily  Apple Cider Vinegar 600mg dail30mEducated on Herbal supplement research  is limited and benefits usually cannot be proven Cost vs benefit of each product must be carefully weighed by individual consumer Supplements may interfere with prescription drugs -Patient is satisfied with current therapy and denies issues -Recommended to continue current medication  Patient Goals/Self-Care Activities Patient will:  - take medications as prescribed check blood pressure at least once daily (patient has been checking twice daily), document, and provide at future appointments  Follow Up Plan: Telephone follow up appointment with care management team member scheduled for: The patient has been provided with contact information for the care management team and has been advised to call with any health related questions or concerns.

## 2020-08-24 ENCOUNTER — Encounter: Payer: Self-pay | Admitting: Internal Medicine

## 2020-08-29 ENCOUNTER — Ambulatory Visit: Payer: Medicare HMO | Admitting: Podiatry

## 2020-08-29 ENCOUNTER — Other Ambulatory Visit: Payer: Self-pay

## 2020-08-29 DIAGNOSIS — M7741 Metatarsalgia, right foot: Secondary | ICD-10-CM | POA: Diagnosis not present

## 2020-08-29 DIAGNOSIS — G5763 Lesion of plantar nerve, bilateral lower limbs: Secondary | ICD-10-CM | POA: Diagnosis not present

## 2020-08-29 DIAGNOSIS — M7742 Metatarsalgia, left foot: Secondary | ICD-10-CM | POA: Diagnosis not present

## 2020-08-29 MED ORDER — GABAPENTIN 100 MG PO CAPS: 100.0000 mg | ORAL_CAPSULE | Freq: Three times a day (TID) | ORAL | 3 refills | Status: DC

## 2020-08-29 NOTE — Progress Notes (Signed)
   HPI: 70 y.o. female presenting today for follow-up evaluation of bilateral foot pain is been going on for about 1 year now.  Patient states that it feels as if she is walking on marbles.  It is exacerbated when she goes barefoot.    Patient states that she is feeling much better.  There is some improvement.  She no longer has any pain or burning sensation to the area.  She continues to have some sensation that she is walking on marbles however.  Past Medical History:  Diagnosis Date   Hypertension      Physical Exam: General: The patient is alert and oriented x3 in no acute distress.  Dermatology: Skin is warm, dry and supple bilateral lower extremities. Negative for open lesions or macerations.  Vascular: Palpable pedal pulses bilaterally. No edema or erythema noted. Capillary refill within normal limits.  Neurological: Epicritic and protective threshold grossly intact bilaterally.  Paresthesia with numbness and sharp shooting pains to the bilateral forefoot.  Musculoskeletal Exam: Range of motion within normal limits to all pedal and ankle joints bilateral. Muscle strength 5/5 in all groups bilateral.  Pain on palpation to the second intermetatarsal space left foot with toe splay.  Consistent with a Morton's neuroma   Assessment: 1.  Morton's neuroma/metatarsalgia bilateral; improved   Plan of Care:  1. Patient evaluated. 2.  Patient is now taking methotrexate weekly from the rheumatologist for RA 3.  Continue gabapentin 100 mg 3 times daily 4.  Recommend not going barefoot.  Recommend good supportive shoes or sneakers 5.  Offloading felt metatarsal pads were also applied to the insoles of the patient's shoes to wear daily to get pressure off of the forefoot  6.  Return to clinic in 4 weeks      Edrick Kins, DPM [facility]  Dr. Edrick Kins, DPM    [address], [place] [address]                Office  [phone]  Fax [phone]

## 2020-09-21 ENCOUNTER — Other Ambulatory Visit: Payer: Self-pay

## 2020-09-21 ENCOUNTER — Ambulatory Visit: Payer: Medicare HMO | Admitting: Internal Medicine

## 2020-09-21 ENCOUNTER — Encounter: Payer: Self-pay | Admitting: Internal Medicine

## 2020-09-21 VITALS — BP 157/75 | HR 80 | Ht 62.0 in | Wt 162.0 lb

## 2020-09-21 DIAGNOSIS — Z79899 Other long term (current) drug therapy: Secondary | ICD-10-CM

## 2020-09-21 DIAGNOSIS — M0579 Rheumatoid arthritis with rheumatoid factor of multiple sites without organ or systems involvement: Secondary | ICD-10-CM

## 2020-09-21 NOTE — Progress Notes (Signed)
Office Visit Note  Patient: Raven Haynes             Date of Birth: 01-Apr-1950           MRN: [id number]             PCP: Hoyt Koch, MD Referring: Hoyt Koch, * Visit Date: 09/21/2020   Subjective:  History of Present Illness: Raven Haynes is a 70 y.o. 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 She tried discontinuing any medication due to symptom improvement but called to report increased joint pain and was recommended to restart at her previous medication dose.  She is now back on the weekly methotrexate since 3 weeks ago her joint swelling is slightly improved and pain is more manageable although she is still having significant symptoms will be describes difficulty holding her granddaughter due to pain and inability to tightly close her hands.  She took a short course of steroids for metatarsalgia seen with Dr. Amalia Hailey.  Previous HPI: 06/22/20 Raven Haynes is a 70 y.o. 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 Her foot pain remains improved, with some residual hypopigmentation at the right foot. Her knee pain is doing well. Hands still show some swelling or enlargement but no significant pain or stiffness.  03/21/20 Raven Haynes is a 70 y.o. female here for follow of seropositive, nonerosive RA on methotrexate 15mg  and tapered off prednisone from 5mg  last month. She has been having persistent right ankle pain not improving in relation to her other joints but this improved during the past few weeks without any specific new intervention. DXA scan was obtained last month showing low fracture risk. She now feels much better and has been able to resume walking which was a usual physical activity for her previously.  02/17/20  Raven Haynes is a 70 y.o. female here for follow of seropositive RA since starting methotrexate about 6 weeks ago and on prednisone 10mg  daily. She was also seen  in the interval by Dr. Junius Roads for right foot injection. She did not have any significant benefit. She was then recommended to have MRI for further assessment but had trouble getting this with insurance declined. She tolerated decreasing prednisone to 5 mg daily without problems. Her hands feel fine but her foot pain is severely limiting weight bearing activities.   Review of Systems  Constitutional:  Positive for fatigue.  HENT:  Negative for mouth sores, mouth dryness and nose dryness.   Eyes:  Negative for pain, itching, visual disturbance and dryness.  Respiratory:  Negative for cough, hemoptysis, shortness of breath and difficulty breathing.   Cardiovascular:  Negative for chest pain, palpitations and swelling in legs/feet.  Gastrointestinal:  Negative for abdominal pain, blood in stool, constipation and diarrhea.  Endocrine: Negative for increased urination.  Genitourinary:  Negative for painful urination.  Musculoskeletal:  Positive for joint pain, joint pain, joint swelling and morning stiffness. Negative for myalgias, muscle weakness, muscle tenderness and myalgias.  Skin:  Negative for color change, rash and redness.  Allergic/Immunologic: Negative for susceptible to infections.  Neurological:  Negative for dizziness, numbness, headaches, memory loss and weakness.  Hematological:  Negative for swollen glands.  Psychiatric/Behavioral:  Negative for confusion and sleep disturbance.    PMFS History:  Patient Active Problem List   Diagnosis Date Noted   Pain in right ankle and joints of right foot 02/17/2020  Rheumatoid arthritis with rheumatoid factor of multiple sites without organ or systems involvement ([place]) 12/30/2019   High risk medication use 12/30/2019   Right knee pain 12/03/2019   Lesion of nose 05/22/2019   Impaired fasting blood sugar 12/19/2018   Routine general medical examination at a health care facility 11/15/2015   Essential hypertension 03/22/2015    Past  Medical History:  Diagnosis Date   Hypertension     Family History  Problem Relation Age of Onset   Diabetes Mother    Breast cancer Neg Hx    Past Surgical History:  Procedure Laterality Date   KNEE SURGERY Bilateral 2004,2009   Social History   Social History Narrative   Not on file   Immunization History  Administered Date(s) Administered   Fluad Quad(high Dose 65+) 12/19/2018, 12/04/2019   Influenza, High Dose Seasonal PF 12/10/2016, 12/13/2017   Influenza,inj,Quad PF,6+ Mos 11/15/2015   PFIZER(Purple Top)SARS-COV-2 Vaccination 03/31/2019, 04/18/2019   Pneumococcal Conjugate-13 03/22/2015   Pneumococcal Polysaccharide-23 03/10/2014   Tdap 09/09/2011   Zoster Recombinat (Shingrix) 10/04/2017   Zoster, Live 12/24/2013     Objective: Vital Signs: BP (!) 157/75 (BP Location: Left Arm, Patient Position: Sitting, Cuff Size: Normal)   Pulse 80   Ht 5\' 2"  (1.575 m)   Wt 162 lb (73.5 kg)   BMI 29.63 kg/m    Physical Exam Cardiovascular:     Rate and Rhythm: Normal rate and regular rhythm.  Pulmonary:     Effort: Pulmonary effort is normal.     Breath sounds: Normal breath sounds.  Skin:    General: Skin is warm and dry.     Findings: No rash.  Neurological:     Mental Status: She is alert.  Psychiatric:        Mood and Affect: Mood normal.     Musculoskeletal Exam:  Shoulders full ROM no tenderness or swelling Elbows full ROM no tenderness or swelling Wrists full ROM no tenderness or swelling Fingers swelling present at right first through third MCPs and second PIP with severely decreased flexion range of motion of second digit, left second and fourth PIP swelling with decreased flexion range of motion and mild swan-neck deformity of the left third digit, small Heberden's nodes of second and third DIPs Knees full ROM no tenderness or swelling Ankles full ROM no tenderness or swelling MTPs full ROM no tenderness or swelling  CDAI Exam: CDAI Score: 19   Patient Global: 50 mm; Provider Global: 60 mm Swollen: 6 ; Tender: 2  Joint Exam 09/21/2020      Right  Left  MCP 1  Swollen      MCP 2  Swollen Tender     MCP 3  Swollen      PIP 2  Swollen Tender  Swollen   PIP 4     Swollen      Investigation: No additional findings.  Imaging: No results found.  Recent Labs: Lab Results  Component Value Date   WBC 5.0 06/22/2020   HGB 11.9 06/22/2020   PLT 275 06/22/2020   NA 139 06/22/2020   K 4.0 06/22/2020   CL 105 06/22/2020   CO2 26 06/22/2020   GLUCOSE 111 (H) 06/22/2020   BUN 12 06/22/2020   CREATININE 0.75 06/22/2020   BILITOT 0.3 06/22/2020   ALKPHOS 65 12/21/2019   AST 18 06/22/2020   ALT 13 06/22/2020   PROT 6.6 06/22/2020   ALBUMIN 4.5 12/21/2019   CALCIUM 9.6 06/22/2020   GFRAA  93 06/22/2020   QFTBGOLDPLUS NEGATIVE 12/30/2019    Speciality Comments: No specialty comments available.  Procedures:  No procedures performed Allergies: Patient has no known allergies.   Assessment / Plan:     Visit Diagnoses: Rheumatoid arthritis with rheumatoid factor of multiple sites without organ or systems involvement ([place]) - Plan: Sedimentation rate  Arthritis symptoms appear markedly increased after discontinuing methotrexate treatment she is only restarted since 3 weeks ago so may need more time for full clinical benefit.  I do have some concern on account of the chronic MCP joint hypertrophy decreased range of motion and early swan-neck deformities that there is some chronic joint damage risk. Recommend increasing methotrexate dose to 20 mg weekly continue folic acid 1 mg daily.  We will check sedimentation rate for disease activity monitoring today.  High risk medication use - Plan: CBC with Differential/Platelet, COMPLETE METABOLIC PANEL WITH GFR  Restarting methotrexate recheck CBC and CMP for medication monitoring will need to recheck in 1 month after dose increase.  Orders: Orders Placed This Encounter  Procedures    Sedimentation rate   CBC with Differential/Platelet   COMPLETE METABOLIC PANEL WITH GFR    No orders of the defined types were placed in this encounter.    Follow-Up Instructions: Return in about 4 weeks (around 10/19/2020) for RA on MTX increase f/u 4wks.   Collier Salina, MD  Note - This record has been created using [employer].  Chart creation errors have been sought, but may not always  have been located. Such creation errors do not reflect on  the standard of medical care.

## 2020-09-22 LAB — CBC WITH DIFFERENTIAL/PLATELET
Absolute Monocytes: 509 cells/uL (ref 200–950)
Basophils Absolute: 19 cells/uL (ref 0–200)
Basophils Relative: 0.4 %
Eosinophils Absolute: 110 cells/uL (ref 15–500)
Eosinophils Relative: 2.3 %
HCT: 36.2 % (ref 35.0–45.0)
Hemoglobin: 11.8 g/dL (ref 11.7–15.5)
Lymphs Abs: 1483 cells/uL (ref 850–3900)
MCH: 29 pg (ref 27.0–33.0)
MCHC: 32.6 g/dL (ref 32.0–36.0)
MCV: 88.9 fL (ref 80.0–100.0)
MPV: 9.8 fL (ref 7.5–12.5)
Monocytes Relative: 10.6 %
Neutro Abs: 2678 cells/uL (ref 1500–7800)
Neutrophils Relative %: 55.8 %
Platelets: 310 10*3/uL (ref 140–400)
RBC: 4.07 10*6/uL (ref 3.80–5.10)
RDW: 12.8 % (ref 11.0–15.0)
Total Lymphocyte: 30.9 %
WBC: 4.8 10*3/uL (ref 3.8–10.8)

## 2020-09-22 LAB — COMPLETE METABOLIC PANEL WITH GFR
AG Ratio: 1.8 (calc) (ref 1.0–2.5)
ALT: 9 U/L (ref 6–29)
AST: 13 U/L (ref 10–35)
Albumin: 4.4 g/dL (ref 3.6–5.1)
Alkaline phosphatase (APISO): 61 U/L (ref 37–153)
BUN: 17 mg/dL (ref 7–25)
CO2: 27 mmol/L (ref 20–32)
Calcium: 9.5 mg/dL (ref 8.6–10.4)
Chloride: 103 mmol/L (ref 98–110)
Creat: 0.61 mg/dL (ref 0.60–1.00)
Globulin: 2.4 g/dL (calc) (ref 1.9–3.7)
Glucose, Bld: 98 mg/dL (ref 65–99)
Potassium: 4.2 mmol/L (ref 3.5–5.3)
Sodium: 140 mmol/L (ref 135–146)
Total Bilirubin: 0.4 mg/dL (ref 0.2–1.2)
Total Protein: 6.8 g/dL (ref 6.1–8.1)
eGFR: 96 mL/min/{1.73_m2} (ref >=60)

## 2020-09-22 LAB — SEDIMENTATION RATE: Sed Rate: 14 mm/h (ref 0–30)

## 2020-09-22 NOTE — Progress Notes (Signed)
Lab tests look fine she can continue at the increased methotrexate 8 tablets weekly (20mg ).

## 2020-09-26 ENCOUNTER — Ambulatory Visit (INDEPENDENT_AMBULATORY_CARE_PROVIDER_SITE_OTHER): Payer: Medicare HMO | Admitting: Podiatry

## 2020-09-26 ENCOUNTER — Other Ambulatory Visit: Payer: Self-pay

## 2020-09-26 DIAGNOSIS — M7742 Metatarsalgia, left foot: Secondary | ICD-10-CM | POA: Diagnosis not present

## 2020-09-26 DIAGNOSIS — G5763 Lesion of plantar nerve, bilateral lower limbs: Secondary | ICD-10-CM

## 2020-09-26 DIAGNOSIS — M7741 Metatarsalgia, right foot: Secondary | ICD-10-CM

## 2020-09-26 NOTE — Progress Notes (Signed)
   HPI: 70 y.o. female presenting today for follow-up evaluation of bilateral foot pain is been going on for about 1 year now.  Patient states that it feels as if she is walking on marbles.  It is exacerbated when she goes barefoot.    Patient states that she is feeling much better.  There is some improvement.  She no longer has any pain or burning sensation to the area.  She continues to have some sensation that she is walking on marbles however.  Past Medical History:  Diagnosis Date   Hypertension      Physical Exam: General: The patient is alert and oriented x3 in no acute distress.  Dermatology: Skin is warm, dry and supple bilateral lower extremities. Negative for open lesions or macerations.  Vascular: Palpable pedal pulses bilaterally. No edema or erythema noted. Capillary refill within normal limits.  Neurological: Epicritic and protective threshold grossly intact bilaterally.  Paresthesia with numbness and sharp shooting pains to the bilateral forefoot.  Musculoskeletal Exam: Range of motion within normal limits to all pedal and ankle joints bilateral. Muscle strength 5/5 in all groups bilateral.  Pain on palpation to the second intermetatarsal space left foot with toe splay.  Consistent with a Morton's neuroma   Assessment: 1.  Morton's neuroma/metatarsalgia bilateral; improved   Plan of Care:  1. Patient evaluated. 2.  Patient is now taking methotrexate weekly from the rheumatologist for RA 3.  Continue gabapentin 100 mg 3 times daily 4.  Recommend not going barefoot.  Recommend good supportive shoes or sneakers 5.  Continue offloading felt metatarsal pads to the insoles of the patient's shoes to wear daily to get pressure off of the forefoot  6.  Return to clinic as needed      Edrick Kins, DPM [facility]  Dr. Edrick Kins, DPM    [address], [place] [address]                Office [phone]  Fax [phone]

## 2020-09-29 ENCOUNTER — Other Ambulatory Visit: Payer: Self-pay | Admitting: Internal Medicine

## 2020-10-04 ENCOUNTER — Telehealth: Payer: Medicare HMO

## 2020-10-10 ENCOUNTER — Telehealth: Payer: Self-pay

## 2020-10-10 NOTE — Chronic Care Management (AMB) (Signed)
    Chronic Care Management Pharmacy Assistant   Name: Raven Haynes  MRN: [id number] DOB: [birth year]   Reason for Encounter: Disease State General    Recent office visits:  None noted  Recent consult visits:  09/26/20-Brent Carver Fila, DPM (Podiatry) Follow up evaluation of bilateral foot pain. Recommend not going barefoot. Recommend good supportive shoes or sneakers. Return to clinic as needed 09/21/20-Christopher Cassie Freer, MD (Rheumatology) Follow up. Recommend increasing methotrexate dose to 20 mg weekly continue folic acid 1 mg daily. Labs ordered. Follow up in 1 month. 08/29/20-Brent Carver Fila, DPM (Podiatry) Follow up evaluation of bilateral foot pain. Offloading felt metatarsal pads were also applied to the insoles of the patient's shoes to wear daily to get pressure off of the forefoot. Follow up in 4 weeks.  Hospital visits:  None in previous 6 months  Medications: Outpatient Encounter Medications as of 10/10/2020  Medication Sig   Apple Cider Vinegar 600 MG CAPS Take 1 capsule by mouth daily.   B Complex-C (SUPER B COMPLEX PO) Take by mouth.   Calcium Citrate-Vitamin D (CITRACAL + D PO) Take 2 tablets by mouth daily. Total daily dose = Calcium '650mg'$  - Vitamin D3 - 1000 units-  Zn - 5.'5mg'$    Cholecalciferol (D3-1000 PO) Take by mouth daily.   Cyanocobalamin (VITAMIN B 12 PO) Take 1,000 mcg by mouth daily.   folic acid (FOLVITE) 1 MG tablet Take 1 tablet (1 mg total) by mouth daily.   gabapentin (NEURONTIN) 100 MG capsule Take 1 capsule (100 mg total) by mouth 3 (three) times daily.   Glucosamine-Chondroitin (MOVE FREE PO) Take 1 tablet by mouth daily.   hydrochlorothiazide (HYDRODIURIL) 25 MG tablet Take 1 tablet (25 mg total) by mouth daily.   losartan (COZAAR) 25 MG tablet TAKE ONE TABLET BY MOUTH ONE TIME DAILY   Magnesium 400 MG CAPS Take by mouth.   methotrexate (RHEUMATREX) 15 MG tablet Take 1 tablet (15 mg total) by mouth once a week. Caution: Chemotherapy. Protect from light.    methylPREDNISolone (MEDROL DOSEPAK) 4 MG TBPK tablet 6 day dose pack - take as directed (Patient not taking: No sig reported)   Red Yeast Rice Extract (RED YEAST RICE PO) Take 1 tablet by mouth daily. Total daily dose = 600 mg   TURMERIC PO Take 1,000 mg by mouth daily.   Zinc Sulfate (ZINC 15 PO) Take 50 mg by mouth daily.   No facility-administered encounter medications on file as of 10/10/2020.    Unsuccessfully reached out to the patient to completed General assessment call. I have left voicemail's for the patient to return phone call.  Star Rating Drugs: Losartan 25 mg Last filled:10/03/20 60 DS  Myriam Elta [place], [place]

## 2020-10-13 ENCOUNTER — Telehealth: Payer: Self-pay | Admitting: Internal Medicine

## 2020-10-13 MED ORDER — LOSARTAN POTASSIUM 25 MG PO TABS: 25.0000 mg | ORAL_TABLET | Freq: Every day | ORAL | 0 refills | Status: DC

## 2020-10-13 NOTE — Telephone Encounter (Signed)
Medication has been sent to the pharmacy. 

## 2020-10-18 ENCOUNTER — Other Ambulatory Visit: Payer: Self-pay

## 2020-10-18 ENCOUNTER — Encounter: Payer: Self-pay | Admitting: Internal Medicine

## 2020-10-18 ENCOUNTER — Ambulatory Visit (INDEPENDENT_AMBULATORY_CARE_PROVIDER_SITE_OTHER): Payer: Medicare HMO | Admitting: Internal Medicine

## 2020-10-18 ENCOUNTER — Telehealth: Payer: Self-pay

## 2020-10-18 VITALS — BP 140/86 | HR 78 | Ht 62.0 in | Wt 159.0 lb

## 2020-10-18 DIAGNOSIS — L259 Unspecified contact dermatitis, unspecified cause: Secondary | ICD-10-CM | POA: Insufficient documentation

## 2020-10-18 DIAGNOSIS — R7301 Impaired fasting glucose: Secondary | ICD-10-CM

## 2020-10-18 DIAGNOSIS — L249 Irritant contact dermatitis, unspecified cause: Secondary | ICD-10-CM | POA: Diagnosis not present

## 2020-10-18 DIAGNOSIS — R21 Rash and other nonspecific skin eruption: Secondary | ICD-10-CM | POA: Insufficient documentation

## 2020-10-18 DIAGNOSIS — I10 Essential (primary) hypertension: Secondary | ICD-10-CM

## 2020-10-18 MED ORDER — METHYLPREDNISOLONE ACETATE 80 MG/ML IJ SUSP
80.0000 mg | Freq: Once | INTRAMUSCULAR | Status: AC
  Administered 2020-10-18: 80 mg via INTRAMUSCULAR

## 2020-10-18 MED ORDER — TRIAMCINOLONE ACETONIDE 0.1 % EX CREA: 1.0000 "application " | TOPICAL_CREAM | Freq: Two times a day (BID) | CUTANEOUS | 0 refills | Status: DC

## 2020-10-18 MED ORDER — PREDNISONE 10 MG PO TABS: ORAL_TABLET | ORAL | 0 refills | Status: DC

## 2020-10-18 NOTE — Telephone Encounter (Signed)
Patient's husband Clare Gandy called stating Tamani started developing a rash on her arms and torso last Wednesday, 10/12/20.  They thought she was exposed to poison ivy, but now believe it could be Shingles even though she did have Shingles vaccination.  Clare Gandy states patient did begin taking larger dose of Methotrexate last week and read that this could be side effect.  Ted requested a return call to let them know if this is something Dr. Benjamine Mola can treat at her appointment tomorrow 10/19/20 at 10:40 am.  They scheduled appointment with PCP today, 10/18/20 at 3:30 pm.  Clare Gandy requested a return call to let him know if they should see PCP for this or wait until her appointment with Dr. Benjamine Mola tomorrow.  Please call back at [phone]

## 2020-10-18 NOTE — Progress Notes (Signed)
Office Visit Note  Patient: Raven Haynes             Date of Birth: 1950-12-21           MRN: [id number]             PCP: Hoyt Koch, MD Referring: Hoyt Koch, * Visit Date: 10/19/2020   Subjective:  Other (Diagnosis of contact dermatitis yesterday on right arm. Patient states it started last Wednesday and resembled an insect bite. )   History of Present Illness: Raven Haynes is a 70 y.o. 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  She feels symptoms are improved further compared to at that time though continues having swelling in a few fingers.  More recently she developed a rash along her inner right elbow this subsequently extended further down the arm and spreading onto her right torso and now has also noticed a few spots on the left arm.  She saw Dr. Jenny Reichmann for this yesterday received an IM steroid injection topical steroid prescription and oral steroid taper.  She has not yet started the other medications besides the injection.  Previous HPI: 09/21/20 Raven Haynes is a 70 y.o. 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 She tried discontinuing any medication due to symptom improvement but called to report increased joint pain and was recommended to restart at her previous medication dose.  She is now back on the weekly methotrexate since 3 weeks ago her joint swelling is slightly improved and pain is more manageable although she is still having significant symptoms will be describes difficulty holding her granddaughter due to pain and inability to tightly close her hands.  She took a short course of steroids for metatarsalgia seen with Dr. Amalia Hailey.  06/22/20 Raven Haynes is a 70 y.o. 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 Her foot pain remains improved, with some residual hypopigmentation at the  right foot. Her knee pain is doing well. Hands still show some swelling or enlargement but no significant pain or stiffness.   03/21/20 Raven Haynes is a 70 y.o. female here for follow of seropositive, nonerosive RA on methotrexate '15mg'$  and tapered off prednisone from '5mg'$  last month. She has been having persistent right ankle pain not improving in relation to her other joints but this improved during the past few weeks without any specific new intervention. DXA scan was obtained last month showing low fracture risk. She now feels much better and has been able to resume walking which was a usual physical activity for her previously.   02/17/20  Raven Haynes is a 70 y.o. female here for follow of seropositive RA since starting methotrexate about 6 weeks ago and on prednisone '10mg'$  daily. She was also seen in the interval by Dr. Junius Roads for right foot injection. She did not have any significant benefit. She was then recommended to have MRI for further assessment but had trouble getting this with insurance declined. She tolerated decreasing prednisone to 5 mg daily without problems. Her hands feel fine but her foot pain is severely limiting weight bearing activities.  Review of Systems  Constitutional:  Positive for fatigue.  HENT:  Negative for mouth sores, mouth dryness and nose dryness.   Eyes:  Positive for visual disturbance. Negative for pain, itching and dryness.  Respiratory:  Negative for shortness of breath and difficulty breathing.   Cardiovascular:  Negative for chest pain and palpitations.  Gastrointestinal:  Negative for blood in stool, constipation, diarrhea, nausea and vomiting.  Endocrine: Negative for increased urination.  Genitourinary:  Negative for difficulty urinating.  Musculoskeletal:  Positive for joint pain, joint pain and joint swelling. Negative for myalgias, morning stiffness, muscle tenderness and myalgias.  Skin:  Positive for rash. Negative for color change.   Allergic/Immunologic: Negative for susceptible to infections.  Neurological:  Negative for dizziness, numbness, headaches, memory loss and weakness.  Hematological:  Negative for bruising/bleeding tendency.  Psychiatric/Behavioral:  Negative for confusion.    PMFS History:  Patient Active Problem List   Diagnosis Date Noted   Contact dermatitis 10/18/2020   Pain in right ankle and joints of right foot 02/17/2020   Rheumatoid arthritis with rheumatoid factor of multiple sites without organ or systems involvement ([place]) 12/30/2019   High risk medication use 12/30/2019   Right knee pain 12/03/2019   Lesion of nose 05/22/2019   Impaired fasting blood sugar 12/19/2018   Routine general medical examination at a health care facility 11/15/2015   Essential hypertension 03/22/2015    Past Medical History:  Diagnosis Date   Hypertension     Family History  Problem Relation Age of Onset   Diabetes Mother    Breast cancer Neg Hx    Past Surgical History:  Procedure Laterality Date   KNEE SURGERY Bilateral 2004,2009   Social History   Social History Narrative   Not on file   Immunization History  Administered Date(s) Administered   Fluad Quad(high Dose 65+) 12/19/2018, 12/04/2019   Influenza, High Dose Seasonal PF 12/10/2016, 12/13/2017   Influenza,inj,Quad PF,6+ Mos 11/15/2015   PFIZER(Purple Top)SARS-COV-2 Vaccination 03/31/2019, 04/18/2019   Pneumococcal Conjugate-13 03/22/2015   Pneumococcal Polysaccharide-23 03/10/2014   Tdap 09/09/2011   Zoster Recombinat (Shingrix) 10/04/2017   Zoster, Live 12/24/2013     Objective: Vital Signs: BP (!) 152/93 (BP Location: Left Arm, Patient Position: Sitting, Cuff Size: Normal)   Pulse 73   Ht '5\' 2"'$  (1.575 m)   Wt 158 lb 6.4 oz (71.8 kg)   BMI 28.97 kg/m    Physical Exam Skin:    General: Skin is warm and dry.     Comments: Erythematous rash in a patch inside the right elbow and on the right flank and a corresponding location,  few vesicular appearing spots on the left forearm  Neurological:     Mental Status: She is alert.     Musculoskeletal Exam:  Neck full ROM no tenderness Shoulders full ROM no tenderness or swelling Elbows full ROM no tenderness or swelling Right wrist appears normal and left wrist swelling with no tenderness or warmth, slightly decreased range of motion bilaterally Right second MCP tenderness and synovitis, left fourth PIP with swelling but no tenderness slightly decreased flexion range of motion Knees full ROM no tenderness or swelling  CDAI Exam: CDAI Score: -- Patient Global: --; Provider Global: -- Swollen: 3 ; Tender: 1  Joint Exam 10/19/2020      Right  Left  Wrist     Swollen   MCP 2  Swollen Tender     PIP 4     Swollen      Investigation: No additional findings.  Imaging: No results found.  Recent Labs: Lab Results  Component Value Date   WBC 3.9 10/19/2020   HGB 12.5 10/19/2020   PLT 266 10/19/2020   NA 139 10/19/2020  K 4.3 10/19/2020   CL 104 10/19/2020   CO2 28 10/19/2020   GLUCOSE 101 (H) 10/19/2020   BUN 15 10/19/2020   CREATININE 0.71 10/19/2020   BILITOT 0.6 10/19/2020   ALKPHOS 65 12/21/2019   AST 15 10/19/2020   ALT 13 10/19/2020   PROT 7.1 10/19/2020   ALBUMIN 4.5 12/21/2019   CALCIUM 9.9 10/19/2020   GFRAA 93 06/22/2020   QFTBGOLDPLUS NEGATIVE 12/30/2019    Speciality Comments: No specialty comments available.  Procedures:  No procedures performed Allergies: Patient has no known allergies.   Assessment / Plan:     Visit Diagnoses: Rheumatoid arthritis with rheumatoid factor of multiple sites without organ or systems involvement ([place]) - Plan: Sedimentation rate, methotrexate (RHEUMATREX) 2.5 MG tablet  There is still a low but present disease activity on exam today although dose has only been increased for 1 month.  We will check sedimentation rate as well.  Plan to continue methotrexate 20 mg p.o. weekly and folic acid 1 mg daily.   In the immediate short-term of the steroid treatments for the skin rash will probably resolve the current inflammation.  Irritant contact dermatitis, unspecified trigger  Rash distribution in the corresponding areas of the right elbow and right flank seems overwhelmingly suggestive for some type of contact dermatitis irritant exposure even though she does not recall any thing specific.  Current steroid treatment plan should be adequate for a self-limited problem.  High risk medication use - Plan: COMPLETE METABOLIC PANEL WITH GFR, CBC with Differential/Platelet, folic acid (FOLVITE) 1 MG tablet  Increase the methotrexate dose will check for medication toxicity monitoring with CBC and CMP today.  Orders: Orders Placed This Encounter  Procedures   Sedimentation rate   COMPLETE METABOLIC PANEL WITH GFR   CBC with Differential/Platelet    Meds ordered this encounter  Medications   methotrexate (RHEUMATREX) 2.5 MG tablet    Sig: Take 8 tablets (20 mg total) by mouth once a week. Caution: Chemotherapy. Protect from light.    Dispense:  96 tablet    Refill:  0   folic acid (FOLVITE) 1 MG tablet    Sig: Take 1 tablet (1 mg total) by mouth daily.    Dispense:  90 tablet    Refill:  0      Follow-Up Instructions: Return in about 3 months (around 01/19/2021) for RA on MTX f/u 83mo.   CCollier Salina MD  Note - This record has been created using [employer]  Chart creation errors have been sought, but may not always  have been located. Such creation errors do not reflect on  the standard of medical care.

## 2020-10-18 NOTE — Telephone Encounter (Signed)
Spoke with Clare Gandy and advised, per Dr. Benjamine Mola, it would be best for them to move forward with PCP appointment today to begin treatment, if needed, then we can discuss how to adjust things on our end at tomorrow's appointment. Patient's husband, Clare Gandy, is in agreement with this plan.

## 2020-10-19 ENCOUNTER — Encounter: Payer: Self-pay | Admitting: Internal Medicine

## 2020-10-19 ENCOUNTER — Telehealth: Payer: Self-pay | Admitting: Internal Medicine

## 2020-10-19 ENCOUNTER — Ambulatory Visit: Payer: Medicare HMO | Admitting: Internal Medicine

## 2020-10-19 VITALS — BP 152/93 | HR 73 | Ht 62.0 in | Wt 158.4 lb

## 2020-10-19 DIAGNOSIS — L249 Irritant contact dermatitis, unspecified cause: Secondary | ICD-10-CM

## 2020-10-19 DIAGNOSIS — Z79899 Other long term (current) drug therapy: Secondary | ICD-10-CM

## 2020-10-19 DIAGNOSIS — M0579 Rheumatoid arthritis with rheumatoid factor of multiple sites without organ or systems involvement: Secondary | ICD-10-CM

## 2020-10-19 LAB — COMPLETE METABOLIC PANEL WITH GFR
AG Ratio: 1.6 (calc) (ref 1.0–2.5)
ALT: 13 U/L (ref 6–29)
AST: 15 U/L (ref 10–35)
Albumin: 4.4 g/dL (ref 3.6–5.1)
Alkaline phosphatase (APISO): 60 U/L (ref 37–153)
BUN: 15 mg/dL (ref 7–25)
CO2: 28 mmol/L (ref 20–32)
Calcium: 9.9 mg/dL (ref 8.6–10.4)
Chloride: 104 mmol/L (ref 98–110)
Creat: 0.71 mg/dL (ref 0.60–1.00)
Globulin: 2.7 g/dL (calc) (ref 1.9–3.7)
Glucose, Bld: 101 mg/dL — ABNORMAL HIGH (ref 65–99)
Potassium: 4.3 mmol/L (ref 3.5–5.3)
Sodium: 139 mmol/L (ref 135–146)
Total Bilirubin: 0.6 mg/dL (ref 0.2–1.2)
Total Protein: 7.1 g/dL (ref 6.1–8.1)
eGFR: 91 mL/min/{1.73_m2} (ref >=60)

## 2020-10-19 LAB — CBC WITH DIFFERENTIAL/PLATELET
Absolute Monocytes: 359 cells/uL (ref 200–950)
Basophils Absolute: 20 cells/uL (ref 0–200)
Basophils Relative: 0.5 %
Eosinophils Absolute: 211 cells/uL (ref 15–500)
Eosinophils Relative: 5.4 %
HCT: 38.6 % (ref 35.0–45.0)
Hemoglobin: 12.5 g/dL (ref 11.7–15.5)
Lymphs Abs: 1322 cells/uL (ref 850–3900)
MCH: 29.1 pg (ref 27.0–33.0)
MCHC: 32.4 g/dL (ref 32.0–36.0)
MCV: 90 fL (ref 80.0–100.0)
MPV: 10.3 fL (ref 7.5–12.5)
Monocytes Relative: 9.2 %
Neutro Abs: 1989 cells/uL (ref 1500–7800)
Neutrophils Relative %: 51 %
Platelets: 266 10*3/uL (ref 140–400)
RBC: 4.29 10*6/uL (ref 3.80–5.10)
RDW: 13.7 % (ref 11.0–15.0)
Total Lymphocyte: 33.9 %
WBC: 3.9 10*3/uL (ref 3.8–10.8)

## 2020-10-19 LAB — SEDIMENTATION RATE: Sed Rate: 9 mm/h (ref 0–30)

## 2020-10-19 MED ORDER — FOLIC ACID 1 MG PO TABS: 1.0000 mg | ORAL_TABLET | Freq: Every day | ORAL | 0 refills | Status: DC

## 2020-10-19 MED ORDER — METHOTREXATE 2.5 MG PO TABS: 20.0000 mg | ORAL_TABLET | ORAL | 0 refills | Status: DC

## 2020-10-19 NOTE — Telephone Encounter (Signed)
  [id number] cream (KENALOG) 0.1 % Pharmacy would like to know how long/ frequency of medication. (30 day, 90 day)  Patient currently at pharmacy. Please call

## 2020-10-19 NOTE — Telephone Encounter (Signed)
[place] for 30 day

## 2020-10-19 NOTE — Telephone Encounter (Signed)
Notified the pharmacy of it being okay for 30 days.

## 2020-10-21 ENCOUNTER — Encounter: Payer: Self-pay | Admitting: Internal Medicine

## 2020-10-21 NOTE — Assessment & Plan Note (Signed)
BP Readings from Last 3 Encounters:  10/19/20 (!) 152/93  10/18/20 140/86  09/21/20 (!) 157/75   Mild uncontrolled, likely reactive, pt to continue medical treatment

## 2020-10-21 NOTE — Assessment & Plan Note (Signed)
Mild to mod, for for depomedrol im 80 mg, predpac asd, triam cr prn, to f/u any worsening symptoms or concerns

## 2020-10-21 NOTE — Progress Notes (Signed)
Patient ID: Shanikqua Running, female   DOB: 04-Aug-1950, 70 y.o.   MRN: [id number]        Chief Complaint: follow up HTN, HLD and rash       HPI:  Billee Cauble is a 70 y.o. female here with new onset 3 days onset itchy erythem rash after working in yard, quit large to the medial right elbow extending above and below the elbow but several cm, without fever, pain, red streaks, LA or drainage.  Also has kissing lesion smaller to right side with lying on her left side to sleep.  Pt denies chest pain, increased sob or doe, wheezing, orthopnea, PND, increased LE swelling, palpitations, dizziness or syncope.   Pt denies polydipsia, polyuria, Wt Readings from Last 3 Encounters:  10/19/20 158 lb 6.4 oz (71.8 kg)  10/18/20 159 lb (72.1 kg)  09/21/20 162 lb (73.5 kg)   BP Readings from Last 3 Encounters:  10/19/20 (!) 152/93  10/18/20 140/86  09/21/20 (!) 157/75         Past Medical History:  Diagnosis Date   Hypertension    Past Surgical History:  Procedure Laterality Date   KNEE SURGERY Bilateral 2004,2009    reports that she has never smoked. She has never used smokeless tobacco. She reports that she does not currently use alcohol. She reports that she does not use drugs. family history includes Diabetes in her mother. No Known Allergies Current Outpatient Medications on File Prior to Visit  Medication Sig Dispense Refill   Apple Cider Vinegar 600 MG CAPS Take 1 capsule by mouth daily.     B Complex-C (SUPER B COMPLEX PO) Take by mouth.     Calcium Citrate-Vitamin D (CITRACAL + D PO) Take 2 tablets by mouth daily. Total daily dose = Calcium '650mg'$  - Vitamin D3 - 1000 units-  Zn - 5.'5mg'$      Cholecalciferol (D3-1000 PO) Take by mouth daily.     Cyanocobalamin (VITAMIN B 12 PO) Take 1,000 mcg by mouth daily.     gabapentin (NEURONTIN) 100 MG capsule Take 1 capsule (100 mg total) by mouth 3 (three) times daily. 90 capsule 3   Glucosamine-Chondroitin (MOVE FREE PO) Take 1 tablet by mouth daily.      hydrochlorothiazide (HYDRODIURIL) 25 MG tablet Take 1 tablet (25 mg total) by mouth daily. 90 tablet 1   losartan (COZAAR) 25 MG tablet Take 1 tablet (25 mg total) by mouth daily. 90 tablet 0   Magnesium 400 MG CAPS Take by mouth.     methylPREDNISolone (MEDROL DOSEPAK) 4 MG TBPK tablet 6 day dose pack - take as directed (Patient not taking: Reported on 10/19/2020) 21 tablet 0   Red Yeast Rice Extract (RED YEAST RICE PO) Take 1 tablet by mouth daily. Total daily dose = 600 mg     TURMERIC PO Take 1,000 mg by mouth daily.     Zinc Sulfate (ZINC 15 PO) Take 50 mg by mouth daily.     No current facility-administered medications on file prior to visit.        ROS:  All others reviewed and negative.  Objective        PE:  BP 140/86 (BP Location: Left Arm, Patient Position: Sitting, Cuff Size: Normal)   Pulse 78   Ht '5\' 2"'$  (1.575 m)   Wt 159 lb (72.1 kg)   SpO2 98%   BMI 29.08 kg/m  Constitutional: Pt appears in NAD               HENT: Head: NCAT.                Right Ear: External ear normal.                 Left Ear: External ear normal.                Eyes: . Pupils are equal, round, and reactive to light. Conjunctivae and EOM are normal               Nose: without d/c or deformity               Neck: Neck supple. Gross normal ROM               Cardiovascular: Normal rate and regular rhythm.                 Pulmonary/Chest: Effort normal and breath sounds without rales or wheezing.                               Neurological: Pt is alert. At baseline orientation, motor grossly intact               Skin: LE edema - none; right medial elbow and several cm above and below itchy erythem somewhat scaly               Psychiatric: Pt behavior is normal without agitation   Micro: none  Cardiac tracings I have personally interpreted today:  none  Pertinent Radiological findings (summarize): none   Lab Results  Component Value Date   WBC 3.9 10/19/2020   HGB 12.5  10/19/2020   HCT 38.6 10/19/2020   PLT 266 10/19/2020   GLUCOSE 101 (H) 10/19/2020   CHOL 170 02/23/2020   TRIG 111.0 02/23/2020   HDL 53.10 02/23/2020   LDLCALC 95 02/23/2020   ALT 13 10/19/2020   AST 15 10/19/2020   NA 139 10/19/2020   K 4.3 10/19/2020   CL 104 10/19/2020   CREATININE 0.71 10/19/2020   BUN 15 10/19/2020   CO2 28 10/19/2020   HGBA1C 6.1 02/23/2020   Assessment/Plan:  Nyna Smolinski is a 70 y.o. Asian [4] female with  has a past medical history of Hypertension.  Contact dermatitis Mild to mod, for for depomedrol im 80 mg, predpac asd, triam cr prn, to f/u any worsening symptoms or concerns  Essential hypertension BP Readings from Last 3 Encounters:  10/19/20 (!) 152/93  10/18/20 140/86  09/21/20 (!) 157/75   Mild uncontrolled, likely reactive, pt to continue medical treatment    Impaired fasting blood sugar Lab Results  Component Value Date   HGBA1C 6.1 02/23/2020   Stable, pt to continue current medical treatment  - diet  Followup: Return if symptoms worsen or fail to improve.  Cathlean Cower, MD 10/21/2020 8:15 PM [facility]

## 2020-10-21 NOTE — Assessment & Plan Note (Signed)
Lab Results  Component Value Date   HGBA1C 6.1 02/23/2020   Stable, pt to continue current medical treatment  - diet

## 2020-10-21 NOTE — Patient Instructions (Signed)
You had the steroid shot today  Please take all new medication as prescribed - the prednisone and cream as needed  Please continue all other medications as before, and refills have been done if requested.  Please have the pharmacy call with any other refills you may need.  Please keep your appointments with your specialists as you may have planned

## 2020-10-27 NOTE — Progress Notes (Signed)
Labs are fine for continuing the methotrexate without any new changes. Hopefully her skin should have cleared up with the steroid treatment.

## 2020-11-22 ENCOUNTER — Ambulatory Visit (INDEPENDENT_AMBULATORY_CARE_PROVIDER_SITE_OTHER): Payer: Medicare HMO

## 2020-11-22 ENCOUNTER — Other Ambulatory Visit: Payer: Self-pay

## 2020-11-22 DIAGNOSIS — I10 Essential (primary) hypertension: Secondary | ICD-10-CM | POA: Diagnosis not present

## 2020-11-22 DIAGNOSIS — M2550 Pain in unspecified joint: Secondary | ICD-10-CM

## 2020-11-22 DIAGNOSIS — M0579 Rheumatoid arthritis with rheumatoid factor of multiple sites without organ or systems involvement: Secondary | ICD-10-CM

## 2020-11-22 DIAGNOSIS — R7301 Impaired fasting glucose: Secondary | ICD-10-CM

## 2020-11-22 NOTE — Progress Notes (Signed)
Chronic Care Management Pharmacy Note  11/22/2020 Name:  Raven Haynes MRN:  [id number] DOB:  Apr 18, 1950  Summary: - Patient reports that rash she developed last month as completed resolved, completed steroid course and steroid cream  - Has restarted and increase methotrexate to 43m weekly - restarted folic acid at 159mdaily - notes that inflammation in hands has improved and she has almost complete ROM in hands - follows up in 01/2021 with Rheum -Has been checking blood pressure, typically averaging 130-140/70-75  Recommendations/Changes made from today's visit: - Recommending no changes at this time, patient will continue to monitor blood pressure and reach out should BP >140/90  Subjective: Raven Haynes is an [age].o. year old female who is a primary patient of CrHoyt KochMD.  The CCM team was consulted for assistance with disease management and care coordination needs.    Engaged with patient by telephone for follow up visit in response to provider referral for pharmacy case management and/or care coordination services.   Consent to Services:  The patient was given the following information about Chronic Care Management services today, agreed to services, and gave verbal consent: 1. CCM service includes personalized support from designated clinical staff supervised by the primary care provider, including individualized plan of care and coordination with other care providers 2. 24/7 contact phone numbers for assistance for urgent and routine care needs. 3. Service will only be billed when office clinical staff spend 20 minutes or more in a month to coordinate care. 4. Only one practitioner may furnish and bill the service in a calendar month. 5.The patient may stop CCM services at any time (effective at the end of the month) by phone call to the office staff. 6. The patient will be responsible for cost sharing (co-pay) of up to 20% of the service fee (after annual deductible is met).  Patient agreed to services and consent obtained.  Patient Care Team: CrHoyt KochMD as PCP - General (Internal Medicine) SzDelice BisonaDarnelle MaffucciRPArkansas [facility] Pharmacist (Pharmacist)  Recent office visits: 10/18/20 - JaCathlean CowerD - rash after working in yard - right elbow, given steroid injection, prednisone prescription, and triamcinolone cream   Recent consult visits: 10/19/20 - ChResa Minerice (Rheumatology) - continue mtx and folic acid, follow up in 3 months  09/26/20-Brent M Carver FilaDPM (Podiatry) Follow up evaluation of bilateral foot pain. Recommend not going barefoot. Recommend good supportive shoes or sneakers. Return to clinic as needed 09/21/20-Christopher W Cassie FreerMD (Rheumatology) Follow up. Recommend increasing methotrexate dose to 20 mg weekly continue folic acid 1 mg daily. Labs ordered. Follow up in 1 month. 08/29/20-Brent M Carver FilaDPM (Podiatry) Follow up evaluation of bilateral foot pain. Offloading felt metatarsal pads were also applied to the insoles of the patient's shoes to wear daily to get pressure off of the forefoot. Follow up in 4 weeks.  Hospital visits: None in previous 6 months  Objective:  Lab Results  Component Value Date   CREATININE 0.71 10/19/2020   BUN 15 10/19/2020   GFR 87.44 12/21/2019   GFRNONAA 80 06/22/2020   GFRAA 93 06/22/2020   NA 139 10/19/2020   K 4.3 10/19/2020   CALCIUM 9.9 10/19/2020   CO2 28 10/19/2020   GLUCOSE 101 (H) 10/19/2020    Lab Results  Component Value Date/Time   HGBA1C 6.1 02/23/2020 09:37 AM   HGBA1C 5.6 05/22/2019 09:18 AM   HGBA1C 6.5 12/19/2018 08:27 AM   GFR 87.44 12/21/2019 02:40 PM  GFR 77.93 12/18/2019 08:50 AM    Last diabetic Eye exam:  No results found for: HMDIABEYEEXA  Last diabetic Foot exam:  No results found for: HMDIABFOOTEX   Lab Results  Component Value Date   CHOL 170 02/23/2020   HDL 53.10 02/23/2020   LDLCALC 95 02/23/2020   TRIG 111.0 02/23/2020   CHOLHDL 3 02/23/2020     Hepatic Function Latest Ref Rng & Units 10/19/2020 09/21/2020 06/22/2020  Total Protein 6.1 - 8.1 g/dL 7.1 6.8 6.6  Albumin 3.5 - 5.2 g/dL - - -  AST 10 - 35 U/L '15 13 18  ' ALT 6 - 29 U/L '13 9 13  ' Alk Phosphatase 39 - 117 U/L - - -  Total Bilirubin 0.2 - 1.2 mg/dL 0.6 0.4 0.3    No results found for: TSH, FREET4  CBC Latest Ref Rng & Units 10/19/2020 09/21/2020 06/22/2020  WBC 3.8 - 10.8 Thousand/uL 3.9 4.8 5.0  Hemoglobin 11.7 - 15.5 g/dL 12.5 11.8 11.9  Hematocrit 35.0 - 45.0 % 38.6 36.2 36.2  Platelets 140 - 400 Thousand/uL 266 310 275    No results found for: VD25OH  Clinical ASCVD: No  The 10-year ASCVD risk score (Arnett DK, et al., 2019) is: 18.5%   Values used to calculate the score:     Age: 78 years     Sex: Female     Is Non-Hispanic African American: No     Diabetic: No     Tobacco smoker: No     Systolic Blood Pressure: 915 mmHg     Is BP treated: Yes     HDL Cholesterol: 53.1 mg/dL     Total Cholesterol: 170 mg/dL    Depression screen [facility] 2/9 02/23/2020 02/23/2020 12/19/2018  Decreased Interest 0 0 0  Down, Depressed, Hopeless 0 1 0  PHQ - 2 Score 0 1 0     Social History   Tobacco Use  Smoking Status Never  Smokeless Tobacco Never   BP Readings from Last 3 Encounters:  10/19/20 (!) 152/93  10/18/20 140/86  09/21/20 (!) 157/75   Pulse Readings from Last 3 Encounters:  10/19/20 73  10/18/20 78  09/21/20 80   Wt Readings from Last 3 Encounters:  10/19/20 158 lb 6.4 oz (71.8 kg)  10/18/20 159 lb (72.1 kg)  09/21/20 162 lb (73.5 kg)   BMI Readings from Last 3 Encounters:  10/19/20 28.97 kg/m  10/18/20 29.08 kg/m  09/21/20 29.63 kg/m    Assessment/Interventions: Review of patient past medical history, allergies, medications, health status, including review of consultants reports, laboratory and other test data, was performed as part of comprehensive evaluation and provision of chronic care management services.   SDOH:  (Social Determinants  of Health) assessments and interventions performed: Yes  SDOH Screenings   Alcohol Screen: Low Risk    Last Alcohol Screening Score (AUDIT): 0  Depression (PHQ2-9): Low Risk    PHQ-2 Score: 0  Financial Resource Strain: Low Risk    Difficulty of Paying Living Expenses: Not hard at all  Food Insecurity: No Food Insecurity   Worried About Charity fundraiser in the Last Year: Never true   Ran Out of Food in the Last Year: Never true  Housing: Low Risk    Last Housing Risk Score: 0  Physical Activity: Sufficiently Active   Days of Exercise per Week: 5 days   Minutes of Exercise per Session: 30 min  Social Connections: Not on file  Stress: No Stress Concern Present  Feeling of Stress : Not at all  Tobacco Use: Low Risk    Smoking Tobacco Use: Never   Smokeless Tobacco Use: Never  Transportation Needs: No Transportation Needs   Lack of Transportation (Medical): No   Lack of Transportation (Non-Medical): No    CCM Care Plan  No Known Allergies  Medications Reviewed Today     Reviewed by [name], [facility] (Pharmacist) on 11/22/20 at [place] List Status: <None>   Medication Order Taking? Sig Documenting Provider Last Dose Status Informant  Apple Cider Vinegar 600 MG CAPS [id number] Yes Take 1 capsule by mouth daily. [provider] Taking Active   B Complex-C (SUPER B COMPLEX PO) [id number] Yes Take by mouth. [provider] Taking Active   Calcium Citrate-Vitamin D (CITRACAL + D PO) [id number] Yes Take 2 tablets by mouth daily. Total daily dose = Calcium 650m - Vitamin D3 - 1000 units-  Zn - 5.564mProvider, Historical, MD Taking Active   Cholecalciferol (D3-1000 PO) [id number] Take by mouth daily. [provider] Taking Active   Cyanocobalamin (VITAMIN B 12 PO) [id number] Take 1,000 mcg by mouth daily. [provider] Taking Active   folic acid (FOLVITE) 1 MG tablet [id number] Take 1 tablet (1 mg total) by mouth daily. RiCollier SalinaMD Taking Active   gabapentin (NEURONTIN) 100 MG capsule [id number] Take 1 capsule (100 mg total) by mouth 3 (three) times daily. EvEdrick KinsDPM Taking Active   Glucosamine-Chondroitin (MOVE FREE PO) [id number] Take 1 tablet by mouth daily. [provider] Taking Active   hydrochlorothiazide (HYDRODIURIL) 25 MG tablet [id number] Take 1 tablet (25 mg total) by mouth daily. BuBinnie RailMD Taking Active   losartan (COZAAR) 25 MG tablet [id number] Take 1 tablet (25 mg total) by mouth daily. CrHoyt KochMD Taking Active   Magnesium 400 MG CAPS [id number] Take by mouth. [provider] Taking Active   methotrexate (RHEUMATREX) 2.5 MG tablet [id number] Take 8 tablets (20 mg total) by mouth once a week. Caution: Chemotherapy. Protect from light. RiCollier SalinaMD Taking Active   Red Yeast Rice Extract (RED YEAST RICE PO) [id number] Take 1 tablet by mouth daily. Total daily dose = 600 mg [provider] Taking Active   TURMERIC PO [id number] Take 1,000 mg by mouth daily. [provider] Taking Active   Zinc Sulfate (ZINC 15 PO) [id number] Take 50 mg by mouth daily. [provider] Taking Active             Patient Active Problem List   Diagnosis Date Noted   Contact dermatitis 10/18/2020   Pain in right ankle and joints of right foot 02/17/2020   Rheumatoid arthritis with rheumatoid factor of multiple sites without organ or systems involvement (HCAshley10/20/2021   High risk medication use 12/30/2019   Right knee pain 12/03/2019   Lesion of nose 05/22/2019   Impaired fasting blood sugar 12/19/2018   Routine general medical examination at a health care facility 11/15/2015   Essential hypertension 03/22/2015    Immunization History  Administered Date(s) Administered   Fluad Quad(high Dose 65+) 12/19/2018, 12/04/2019   Influenza, High Dose Seasonal PF 12/10/2016, 12/13/2017    Influenza,inj,Quad PF,6+ Mos 11/15/2015   PFIZER(Purple Top)SARS-COV-2 Vaccination 03/31/2019, 04/18/2019   Pneumococcal Conjugate-13 03/22/2015   Pneumococcal Polysaccharide-23 03/10/2014   Tdap 09/09/2011   Zoster Recombinat (Shingrix) 10/04/2017   Zoster, Live 12/24/2013  Conditions to be addressed/monitored:  Hypertension, Prediabetes, and Rheumatoid Arthritis  Care Plan : CCM Care Plan  Updates made by [name], RPH since 11/22/2020 12:00 AM     Problem: HTN, prediabetes, RA, Chronic pain   Priority: High  Onset Date: 08/23/2020     Long-Range Goal: Disease Management   Start Date: 08/23/2020  Expected End Date: 02/22/2021  This Visit's Progress: On track  Recent Progress: On track  Priority: High  Note:   Current Barriers:  Unable to independently monitor therapeutic efficacy  Pharmacist Clinical Goal(s):  Patient will maintain control of blood pressure as evidenced by blood pressure logs  through collaboration with PharmD and provider.   Interventions: 1:1 collaboration with Hoyt Koch, MD regarding development and update of comprehensive plan of care as evidenced by provider attestation and co-signature Inter-disciplinary care team collaboration (see longitudinal plan of care) Comprehensive medication review performed; medication list updated in electronic medical record  Hypertension (BP goal <140/90) -Controlled -Current treatment: Hydrochlorothiazide 109m daily  Losartan 241mdaily  -Medications previously tried: n/a  -Current home readings: averaging 130-140/ 70-75 -Current dietary habits: reports that she only drinks coffee in AM, cognizant of salt intake  -Current exercise habits: walking, elliptical, and weight lifting  - trying to exercise every other day  -Denies hypotensive/hypertensive symptoms -Educated on BP goals and benefits of medications for prevention of heart attack, stroke and kidney damage; Daily salt intake goal < 2300  mg; Exercise goal of 150 minutes per week; Importance of home blood pressure monitoring; Proper BP monitoring technique; Symptoms of hypotension and importance of maintaining adequate hydration; -Counseled to monitor BP at home twice daily, document, and provide log at future appointments -Counseled on diet and exercise extensively Recommended to continue current medication  Prediabetes (A1c goal <6.5%) -Controlled (diet) Lab Results  Component Value Date   HGBA1C 6.1 02/23/2020  Medications previously tried: n/a  -Denies hypoglycemic/hyperglycemic symptoms -Current meal patterns:  breakfast: eggs,fresh fruit, toast   lunch: salad, fruit  dinner: chicken, tuKuwaitfish, vegetables  snacks: apples, mango, cherries, blueberries,  drinks: water, coffee in the AM, pomegranate juice  -Current exercise: walking, using elliptical, or weight training about every day/ every other day -Educated on A1c and blood sugar goals; Complications of diabetes including kidney damage, retinal damage, and cardiovascular disease; Exercise goal of 150 minutes per week; Benefits of weight loss; -Counseled to check feet daily and get yearly eye exams -Counseled on diet and exercise extensively   Chronic Pain (right ankle) / Rheumatoid arthritis (hands) (Goal: pain control / prevention of flares) -Controlled -Current treatment  Gabapentin 10070m 1 capsule 3 times daily Methotrexate 60m96PRekly  Folic acid 1mg95mily -Medications previously tried: meloxicam, tramadol, hydrocodone  -Recommended to continue current medication  Health Maintenance -Vaccine gaps: Shingles vaccine / COVID booster  -Current therapy:  Vitamin B complex with vitamin C - once daily  Turmeric 1000mg49me daily  Calcium Citrate- Vit D - 2 tablets daily  Vit D3 - 5000 units daily  Vit B12 1000mcg41m tablet daily  Magnesium 400mg d44m  Red Yeast Rice extract  - 600mg da72m Move Free (Glucosamine - Chondroitin)  Zinc  Sulfate 50 mg daily  Apple Cider Vinegar 600mg dai76m-Educated on Herbal supplement research is limited and benefits usually cannot be proven Cost vs benefit of each product must be carefully weighed by individual consumer Supplements may interfere with prescription drugs -Patient is satisfied with current therapy and denies issues -Recommended  to continue current medication  Patient Goals/Self-Care Activities Patient will:  - take medications as prescribed check blood pressure at least once daily (patient has been checking twice daily), document, and provide at future appointments  Follow Up Plan: Telephone follow up appointment with care management team member scheduled for: 6 months The patient has been provided with contact information for the care management team and has been advised to call with any health related questions or concerns.     Medication Assistance: None required.  Patient affirms current coverage meets needs.  Compliance/Adherence/Medication fill history: Care Gaps: Due for COVID19 booster (3rd shot)  Patient's preferred pharmacy is:  [facility] [id number], [place] [address] [place] [address] Phone: [phone] Fax: [phone]   Uses pill box? Yes Pt endorses 100% compliance  Care Plan and Follow Up Patient Decision:  Patient agrees to Care Plan and Follow-up.  Plan: Telephone follow up appointment with care management team member scheduled for:  3 months and The patient has been provided with contact information for the care management team and has been advised to call with any health related questions or concerns.   [name], PharmD Clinical Pharmacist, [place]

## 2020-11-22 NOTE — Patient Instructions (Signed)
Raven Haynes,  It was great to talk to you today!  Please call me with any questions or concerns.  Visit Information  PATIENT GOALS:  Goals Addressed             This Visit's Progress    Manage My Medicine   On track    Timeframe:  Long-Range Goal Priority:  Medium Start Date:   08/23/2020                         Expected End Date:   02/22/2021                    Follow Up Date 11/23/2020    - call for medicine refill 2 or 3 days before it runs out - keep a list of all the medicines I take; vitamins and herbals too    Why is this important?   These steps will help you keep on track with your medicines.   Notes: Patient to keep clinic updated on supplements that she starts     Track and Manage My Blood Pressure-Hypertension   On track    Timeframe:  Long-Range Goal Priority:  High Start Date: 08/23/2020                            Expected End Date: 02/22/2021                     Follow Up Date 11/23/2020    - check blood pressure daily - choose a place to take my blood pressure (home, clinic or office, retail store) - write blood pressure results in a log or diary    Why is this important?   You won't feel high blood pressure, but it can still hurt your blood vessels.  High blood pressure can cause heart or kidney problems. It can also cause a stroke.  Making lifestyle changes like losing a little weight or eating less salt will help.  Checking your blood pressure at home and at different times of the day can help to control blood pressure.  If the doctor prescribes medicine remember to take it the way the doctor ordered.  Call the office if you cannot afford the medicine or if there are questions about it.     Notes: Patient to call office should blood pressure be uncontrolled (>140/90) or if she is having issues with hypotension         Patient verbalizes understanding of instructions provided today and agrees to view in [place].   Telephone follow up  appointment with care management team member scheduled for: 6 months The patient has been provided with contact information for the care management team and has been advised to call with any health related questions or concerns.   [name], PharmD Clinical Pharmacist, [place]

## 2020-11-25 ENCOUNTER — Other Ambulatory Visit: Payer: Self-pay | Admitting: Internal Medicine

## 2020-11-25 MED ORDER — HYDROCHLOROTHIAZIDE 25 MG PO TABS: 25.0000 mg | ORAL_TABLET | Freq: Every day | ORAL | 3 refills | Status: DC

## 2020-11-25 MED ORDER — LOSARTAN POTASSIUM 25 MG PO TABS: 25.0000 mg | ORAL_TABLET | Freq: Every day | ORAL | 3 refills | Status: DC

## 2020-12-04 DIAGNOSIS — Z20822 Contact with and (suspected) exposure to covid-19: Secondary | ICD-10-CM | POA: Diagnosis not present

## 2020-12-04 DIAGNOSIS — J069 Acute upper respiratory infection, unspecified: Secondary | ICD-10-CM | POA: Diagnosis not present

## 2020-12-07 DIAGNOSIS — J019 Acute sinusitis, unspecified: Secondary | ICD-10-CM | POA: Diagnosis not present

## 2020-12-07 DIAGNOSIS — R059 Cough, unspecified: Secondary | ICD-10-CM | POA: Diagnosis not present

## 2020-12-07 DIAGNOSIS — Z20822 Contact with and (suspected) exposure to covid-19: Secondary | ICD-10-CM | POA: Diagnosis not present

## 2020-12-08 ENCOUNTER — Telehealth: Payer: Self-pay

## 2020-12-08 NOTE — Telephone Encounter (Signed)
Attempted to contact the patient and left message for patient to call the office.  

## 2020-12-08 NOTE — Telephone Encounter (Signed)
Patient called stating she has a bad cold and was prescribed Amoxicillin which she started taking yesterday, 12/08/20.  Patient states she didn't take her Methotrexate this week and requested a return call to let her know if she can take it today.

## 2020-12-08 NOTE — Telephone Encounter (Signed)
Patient advised Dr. Benjamine Mola recommended she wait to resume the methotrexate until after finishing all oral antibiotics for an infection, and until no fevers if she had one. If this delays her taking the medicine by more than 1 day she should wait till the next week to resume. Interruption for 2 weeks or less is usually a low risk of causing RA to flare. Patient expressed understanding.

## 2020-12-08 NOTE — Telephone Encounter (Signed)
I recommend she wait to resume the methotrexate until after finishing all oral antibiotics for an infection, and until no fevers if she had one. If this delays her taking the medicine by more than 1 day she should wait till the next week to resume. Interruption for 2 weeks or less is usually a low risk of causing RA to flare.

## 2020-12-21 ENCOUNTER — Telehealth: Payer: Self-pay | Admitting: Internal Medicine

## 2020-12-21 NOTE — Telephone Encounter (Signed)
Patient is showing symptoms of food poisoning  Wants to speak w/ nurse or provider  Please call [phone]

## 2020-12-22 NOTE — Telephone Encounter (Signed)
Called patient. LVM asking her to give our office a call to discuss her symptoms. Office number was provided.    If patient calls back please ask her what symptoms she is currently experiencing.

## 2020-12-22 NOTE — Telephone Encounter (Signed)
Fyi.

## 2020-12-22 NOTE — Telephone Encounter (Signed)
Thursday a week ago, right rib, going urgent care to get an x-ray since Dr. Sharlet Salina doesn't have any appointments  Food poisoning after lunch, little better than last night, able to eat something

## 2020-12-23 DIAGNOSIS — S2241XA Multiple fractures of ribs, right side, initial encounter for closed fracture: Secondary | ICD-10-CM | POA: Diagnosis not present

## 2020-12-23 DIAGNOSIS — S20211A Contusion of right front wall of thorax, initial encounter: Secondary | ICD-10-CM | POA: Diagnosis not present

## 2020-12-23 DIAGNOSIS — I10 Essential (primary) hypertension: Secondary | ICD-10-CM | POA: Diagnosis not present

## 2021-01-17 NOTE — Progress Notes (Signed)
Office Visit Note  Patient: Raven Haynes             Date of Birth: 07/27/1950           MRN: [id number]             PCP: Hoyt Koch, MD Referring: Hoyt Koch, * Visit Date: 01/18/2021   Subjective:  Follow-up (Facial rash)   History of Present Illness: Raven Haynes is a 70 y.o. 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 Last month she suffered fracture to her right 7th and 8th ribs.  Besides this no major worsening of joint pain or swelling.  New rash on the face without significant symptoms. No oral ulcers or pain. There is been no recurrence of the previous arm and truncal rash.  Previous HPI 10/19/20 Raven Haynes is a 70 y.o. 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  She feels symptoms are improved further compared to at that time though continues having swelling in a few fingers.  More recently she developed a rash along her inner right elbow this subsequently extended further down the arm and spreading onto her right torso and now has also noticed a few spots on the left arm.  She saw Dr. Jenny Reichmann for this yesterday received an IM steroid injection topical steroid prescription and oral steroid taper.  She has not yet started the other medications besides the injection.    02/17/20 Raven Haynes is a 70 y.o. female here for follow of seropositive RA since starting methotrexate about 6 weeks ago and on prednisone 10mg  daily. She was also seen in the interval by Dr. Junius Roads for right foot injection. She did not have any significant benefit. She was then recommended to have MRI for further assessment but had trouble getting this with insurance declined. She tolerated decreasing prednisone to 5 mg daily without problems. Her hands feel fine but her foot pain is severely limiting weight bearing activities.   Review of Systems  Constitutional:  Positive for fatigue.   HENT:  Negative for mouth dryness.   Eyes:  Positive for dryness.  Respiratory:  Negative for shortness of breath.   Cardiovascular:  Negative for swelling in legs/feet.  Gastrointestinal:  Negative for constipation.  Endocrine: Negative for excessive thirst.  Genitourinary:  Negative for difficulty urinating.  Musculoskeletal:  Positive for gait problem, joint swelling and morning stiffness.  Skin:  Positive for rash.  Allergic/Immunologic: Negative for susceptible to infections.  Neurological:  Positive for weakness.  Hematological:  Negative for bruising/bleeding tendency.  Psychiatric/Behavioral:  Negative for sleep disturbance.    PMFS History:  Patient Active Problem List   Diagnosis Date Noted   Rash and other nonspecific skin eruption 10/18/2020   Pain in right ankle and joints of right foot 02/17/2020   Rheumatoid arthritis with rheumatoid factor of multiple sites without organ or systems involvement ([place]) 12/30/2019   High risk medication use 12/30/2019   Right knee pain 12/03/2019   Lesion of nose 05/22/2019   Impaired fasting blood sugar 12/19/2018   Routine general medical examination at a health care facility 11/15/2015   Essential hypertension 03/22/2015    Past Medical History:  Diagnosis Date   Hypertension     Family History  Problem Relation Age of Onset   Diabetes Mother    Diabetes Sister    Breast cancer Neg Hx  Past Surgical History:  Procedure Laterality Date   KNEE SURGERY Bilateral [phone]   Social History   Social History Narrative   Not on file   Immunization History  Administered Date(s) Administered   Fluad Quad(high Dose 65+) 12/19/2018, 12/04/2019   Influenza, High Dose Seasonal PF 12/10/2016, 12/13/2017   Influenza,inj,Quad PF,6+ Mos 11/15/2015   PFIZER(Purple Top)SARS-COV-2 Vaccination 03/31/2019, 04/18/2019   Pneumococcal Conjugate-13 03/22/2015   Pneumococcal Polysaccharide-23 03/10/2014   Tdap 09/09/2011   Zoster  Recombinat (Shingrix) 10/04/2017   Zoster, Live 12/24/2013     Objective: Vital Signs: BP (!) 166/76 (BP Location: Left Arm, Patient Position: Sitting, Cuff Size: Normal)   Pulse 62   Resp 15   Ht 5\' 2"  (1.575 m)   Wt 158 lb (71.7 kg)   BMI 28.90 kg/m    Physical Exam HENT:     Mouth/Throat:     Mouth: Mucous membranes are moist.     Pharynx: Oropharynx is clear.  Eyes:     Conjunctiva/sclera: Conjunctivae normal.  Cardiovascular:     Rate and Rhythm: Normal rate and regular rhythm.  Pulmonary:     Effort: Pulmonary effort is normal.     Breath sounds: Normal breath sounds.  Skin:    General: Skin is warm and dry.  Neurological:     Mental Status: She is alert.  Psychiatric:        Mood and Affect: Mood normal.     Musculoskeletal Exam:  Shoulders full ROM no tenderness or swelling Elbows full ROM no tenderness or swelling Wrists full ROM no tenderness or swelling Fingers full ROM no tenderness or swelling Knees full ROM no tenderness or swelling   Investigation: No additional findings.  Imaging: No results found.  Recent Labs: Lab Results  Component Value Date   WBC 4.7 01/18/2021   HGB 12.0 01/18/2021   PLT 285 01/18/2021   NA 140 01/18/2021   K 4.1 01/18/2021   CL 104 01/18/2021   CO2 29 01/18/2021   GLUCOSE 95 01/18/2021   BUN 15 01/18/2021   CREATININE 0.73 01/18/2021   BILITOT 0.4 01/18/2021   ALKPHOS 65 12/21/2019   AST 18 01/18/2021   ALT 12 01/18/2021   PROT 7.0 01/18/2021   ALBUMIN 4.5 12/21/2019   CALCIUM 9.5 01/18/2021   GFRAA 93 06/22/2020   QFTBGOLDPLUS NEGATIVE 12/30/2019    Speciality Comments: No specialty comments available.  Procedures:  No procedures performed Allergies: Patient has no known allergies.   Assessment / Plan:     Visit Diagnoses: Rheumatoid arthritis with rheumatoid factor of multiple sites without organ or systems involvement ([place]) - Plan: Sedimentation rate, methotrexate (RHEUMATREX) 2.5 MG  tablet  Joint disease appears well controlled today still has mild degree of osteoarthritis symptoms with chronic changes.  Checking sedimentation rate for disease activity monitoring.  Plan to continue methotrexate 20 mg p.o. weekly and folic acid 1 mg daily.  High risk medication use - Plan: COMPLETE METABOLIC PANEL WITH GFR, CBC with Differential/Platelet, folic acid (FOLVITE) 1 MG tablet  Long-term treatment with methotrexate checking CBC and CMP today for medication monitoring.  I do not think her skin rashes are typical for medication side effect.  Orders: Orders Placed This Encounter  Procedures   Sedimentation rate   COMPLETE METABOLIC PANEL WITH GFR   CBC with Differential/Platelet    Meds ordered this encounter  Medications   methotrexate (RHEUMATREX) 2.5 MG tablet    Sig: Take 8 tablets (20 mg total) by mouth once a  week. Caution: Chemotherapy. Protect from light.    Dispense:  96 tablet    Refill:  0   folic acid (FOLVITE) 1 MG tablet    Sig: Take 1 tablet (1 mg total) by mouth daily.    Dispense:  90 tablet    Refill:  0      Follow-Up Instructions: Return in about 3 months (around 04/20/2021) for RA on MTX u 49mos.   Collier Salina, MD  Note - This record has been created using [employer].  Chart creation errors have been sought, but may not always  have been located. Such creation errors do not reflect on  the standard of medical care.

## 2021-01-18 ENCOUNTER — Encounter: Payer: Self-pay | Admitting: Internal Medicine

## 2021-01-18 ENCOUNTER — Ambulatory Visit: Payer: Medicare HMO | Admitting: Internal Medicine

## 2021-01-18 ENCOUNTER — Other Ambulatory Visit: Payer: Self-pay

## 2021-01-18 VITALS — BP 166/76 | HR 62 | Resp 15 | Ht 62.0 in | Wt 158.0 lb

## 2021-01-18 DIAGNOSIS — M0579 Rheumatoid arthritis with rheumatoid factor of multiple sites without organ or systems involvement: Secondary | ICD-10-CM | POA: Diagnosis not present

## 2021-01-18 DIAGNOSIS — Z79899 Other long term (current) drug therapy: Secondary | ICD-10-CM | POA: Diagnosis not present

## 2021-01-18 MED ORDER — FOLIC ACID 1 MG PO TABS: 1.0000 mg | ORAL_TABLET | Freq: Every day | ORAL | 0 refills | Status: DC

## 2021-01-18 MED ORDER — METHOTREXATE 2.5 MG PO TABS: 20.0000 mg | ORAL_TABLET | ORAL | 0 refills | Status: AC

## 2021-01-19 LAB — CBC WITH DIFFERENTIAL/PLATELET
Absolute Monocytes: 418 cells/uL (ref 200–950)
Basophils Absolute: 19 cells/uL (ref 0–200)
Basophils Relative: 0.4 %
Eosinophils Absolute: 188 cells/uL (ref 15–500)
Eosinophils Relative: 4 %
HCT: 37.1 % (ref 35.0–45.0)
Hemoglobin: 12 g/dL (ref 11.7–15.5)
Lymphs Abs: 1485 cells/uL (ref 850–3900)
MCH: 29.4 pg (ref 27.0–33.0)
MCHC: 32.3 g/dL (ref 32.0–36.0)
MCV: 90.9 fL (ref 80.0–100.0)
MPV: 9.9 fL (ref 7.5–12.5)
Monocytes Relative: 8.9 %
Neutro Abs: 2590 cells/uL (ref 1500–7800)
Neutrophils Relative %: 55.1 %
Platelets: 285 10*3/uL (ref 140–400)
RBC: 4.08 10*6/uL (ref 3.80–5.10)
RDW: 13.1 % (ref 11.0–15.0)
Total Lymphocyte: 31.6 %
WBC: 4.7 10*3/uL (ref 3.8–10.8)

## 2021-01-19 LAB — COMPLETE METABOLIC PANEL WITH GFR
AG Ratio: 1.6 (calc) (ref 1.0–2.5)
ALT: 12 U/L (ref 6–29)
AST: 18 U/L (ref 10–35)
Albumin: 4.3 g/dL (ref 3.6–5.1)
Alkaline phosphatase (APISO): 73 U/L (ref 37–153)
BUN: 15 mg/dL (ref 7–25)
CO2: 29 mmol/L (ref 20–32)
Calcium: 9.5 mg/dL (ref 8.6–10.4)
Chloride: 104 mmol/L (ref 98–110)
Creat: 0.73 mg/dL (ref 0.60–1.00)
Globulin: 2.7 g/dL (calc) (ref 1.9–3.7)
Glucose, Bld: 95 mg/dL (ref 65–99)
Potassium: 4.1 mmol/L (ref 3.5–5.3)
Sodium: 140 mmol/L (ref 135–146)
Total Bilirubin: 0.4 mg/dL (ref 0.2–1.2)
Total Protein: 7 g/dL (ref 6.1–8.1)
eGFR: 88 mL/min/{1.73_m2} (ref >=60)

## 2021-01-19 LAB — SEDIMENTATION RATE: Sed Rate: 17 mm/h (ref 0–30)

## 2021-02-07 ENCOUNTER — Encounter: Payer: Self-pay | Admitting: Internal Medicine

## 2021-02-08 ENCOUNTER — Telehealth: Payer: Self-pay | Admitting: Internal Medicine

## 2021-02-08 NOTE — Telephone Encounter (Signed)
Patient requesting refill on MTX sent to [facility] in [place].

## 2021-02-08 NOTE — Telephone Encounter (Signed)
Spoke with patient and advised her prescription was sent in on 01/18/2021. Patient advised she should be able to have the pharmacy fill the prescription and pick it up. Patient advised to contact the office if she has any trouble.

## 2021-03-15 ENCOUNTER — Encounter: Payer: Medicare HMO | Admitting: Internal Medicine

## 2021-03-24 ENCOUNTER — Telehealth: Payer: Self-pay | Admitting: Internal Medicine

## 2021-03-24 NOTE — Telephone Encounter (Signed)
LM informing pt that her AWV was due and we have scheduled her for 04/03/21 @ 1:40-same day as her CPE with Dr. Sharlet Salina @ 2:20. Advised pt to call me back if she was unable to come in at 1:40 on 04/03/21.

## 2021-04-03 ENCOUNTER — Ambulatory Visit (INDEPENDENT_AMBULATORY_CARE_PROVIDER_SITE_OTHER): Payer: Medicare HMO | Admitting: Internal Medicine

## 2021-04-03 ENCOUNTER — Other Ambulatory Visit: Payer: Self-pay

## 2021-04-03 ENCOUNTER — Encounter: Payer: Self-pay | Admitting: Internal Medicine

## 2021-04-03 ENCOUNTER — Ambulatory Visit (INDEPENDENT_AMBULATORY_CARE_PROVIDER_SITE_OTHER): Payer: Medicare HMO

## 2021-04-03 VITALS — BP 140/90 | HR 58 | Temp 98.2°F | Ht 62.0 in | Wt 159.8 lb

## 2021-04-03 VITALS — BP 132/82 | HR 60 | Temp 98.2°F | Resp 18 | Ht 62.0 in | Wt 159.8 lb

## 2021-04-03 DIAGNOSIS — I10 Essential (primary) hypertension: Secondary | ICD-10-CM

## 2021-04-03 DIAGNOSIS — Z1231 Encounter for screening mammogram for malignant neoplasm of breast: Secondary | ICD-10-CM | POA: Diagnosis not present

## 2021-04-03 DIAGNOSIS — E2839 Other primary ovarian failure: Secondary | ICD-10-CM

## 2021-04-03 DIAGNOSIS — Z0000 Encounter for general adult medical examination without abnormal findings: Secondary | ICD-10-CM

## 2021-04-03 DIAGNOSIS — R7301 Impaired fasting glucose: Secondary | ICD-10-CM

## 2021-04-03 LAB — LIPID PANEL
Cholesterol: 197 mg/dL (ref 0–200)
HDL: 44.5 mg/dL (ref >39.00)
LDL Cholesterol: 116 mg/dL — ABNORMAL HIGH (ref 0–99)
NonHDL: 152.46
Total CHOL/HDL Ratio: 4
Triglycerides: 184 mg/dL — ABNORMAL HIGH (ref 0.0–149.0)
VLDL: 36.8 mg/dL (ref 0.0–40.0)

## 2021-04-03 LAB — HEMOGLOBIN A1C: Hgb A1c MFr Bld: 6.2 % (ref 4.6–6.5)

## 2021-04-03 MED ORDER — PROMETHAZINE-DM 6.25-15 MG/5ML PO SYRP: 5.0000 mL | ORAL_SOLUTION | Freq: Four times a day (QID) | ORAL | 0 refills | Status: DC | PRN

## 2021-04-03 NOTE — Assessment & Plan Note (Signed)
Flu shot declines. Pneumonia declines. Shingrix declines. Tetanus due 23 declines. Colonoscopy due 2025. Mammogram ordered, pap smear aged out and dexa ordered due this year. Counseled about sun safety and mole surveillance. Counseled about the dangers of distracted driving. Given 10 year screening recommendations.

## 2021-04-03 NOTE — Progress Notes (Signed)
Subjective:   Raven Haynes is a 71 y.o. female who presents for Medicare Annual (Subsequent) preventive examination.  Review of Systems     Cardiac Risk Factors include: advanced age (>53men, >34 women);family history of premature cardiovascular disease;hypertension     Objective:    Today's Vitals   04/03/21 1343  BP: 140/90  Pulse: (!) 58  Temp: 98.2 F (36.8 C)  TempSrc: Temporal  SpO2: 97%  Weight: 159 lb 12.8 oz (72.5 kg)  Height: 5\' 2"  (1.575 m)  PainSc: 0-No pain   Body mass index is 29.23 kg/m.  Advanced Directives 04/03/2021 03/01/2020 02/23/2020 12/19/2018 12/13/2017  Does Patient Have a Medical Advance Directive? Yes Yes Yes Yes Yes  Type of Advance Directive Living will;Healthcare Power of Raven Haynes;Living will - [place];Living will [place];Living will  Does patient want to make changes to medical advance directive? No - Patient declined - - - -  Copy of Raven Haynes in Chart? No - copy requested - - No - copy requested No - copy requested  Would patient like information on creating a medical advance directive? - - No - Patient declined - -    Current Medications (verified) Outpatient Encounter Medications as of 04/03/2021  Medication Sig   Apple Cider Vinegar 600 MG CAPS Take 1 capsule by mouth daily.   B Complex-C (SUPER B COMPLEX PO) Take by mouth.   Calcium Citrate-Vitamin D (CITRACAL + D PO) Take 2 tablets by mouth daily. Total daily dose = Calcium 650mg  - Vitamin D3 - 1000 units-  Zn - 5.5mg    Cholecalciferol (D3-1000 PO) Take by mouth daily.   Cyanocobalamin (VITAMIN B 12 PO) Take 1,000 mcg by mouth daily.   folic acid (FOLVITE) 1 MG tablet Take 1 tablet (1 mg total) by mouth daily.   gabapentin (NEURONTIN) 100 MG capsule Take 1 capsule (100 mg total) by mouth 3 (three) times daily.   Glucosamine-Chondroitin (MOVE FREE PO) Take 1 tablet by mouth daily.    hydrochlorothiazide (HYDRODIURIL) 25 MG tablet Take 1 tablet (25 mg total) by mouth daily.   losartan (COZAAR) 25 MG tablet Take 1 tablet (25 mg total) by mouth daily.   Magnesium 400 MG CAPS Take by mouth.   methotrexate (RHEUMATREX) 2.5 MG tablet Take 8 tablets (20 mg total) by mouth once a week. Caution: Chemotherapy. Protect from light.   Red Yeast Rice Extract (RED YEAST RICE PO) Take 1 tablet by mouth daily. Total daily dose = 600 mg   TURMERIC PO Take 1,000 mg by mouth daily.   Zinc Sulfate (ZINC 15 PO) Take 50 mg by mouth daily.   No facility-administered encounter medications on file as of 04/03/2021.    Allergies (verified) Patient has no known allergies.   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  Tobacco Use  Smoking status: Never   Smokeless tobacco: Never  Vaping Use   Vaping Use: Never used  Substance and Sexual Activity   Alcohol use: Not Currently   Drug use: No   Sexual activity: Yes  Other Topics Concern   Not on file  Social History Narrative   Not on file   Social Determinants of Health   Financial Resource Strain: Low Risk    Difficulty of Paying Living Expenses: Not hard at all  Food Insecurity: No Food Insecurity   Worried About Charity fundraiser in the Last Year: Never true   [place] in the Last Year: Never true  Transportation Needs: No Transportation Needs   Lack of Transportation (Medical): No   Lack of Transportation  (Non-Medical): No  Physical Activity: Sufficiently Active   Days of Exercise per Week: 5 days   Minutes of Exercise per Session: 30 min  Stress: No Stress Concern Present   Feeling of Stress : Not at all  Social Connections: Socially Integrated   Frequency of Communication with Friends and Family: More than three times a week   Frequency of Social Gatherings with Friends and Family: More than three times a week   Attends Religious Services: More than 4 times per year   Active Member of [employer] or Organizations: Yes   Attends Music therapist: More than 4 times per year   Marital Status: Married    Tobacco Counseling Counseling given: Not Answered   Clinical Intake:  Pre-visit preparation completed: Yes  Pain : No/denies pain Pain Score: 0-No pain     BMI - recorded: 29.23 Nutritional Status: BMI 25 -29 Overweight Nutritional Risks: None Diabetes: No  How often do you need to have someone help you when you read instructions, pamphlets, or other written materials from your doctor or pharmacy?: 1 - Never What is the last grade level you completed in school?: some college  Diabetic? no  Interpreter Needed?: No  Information entered by :: Lisette Abu, LPN   Activities of Daily Living In your present state of health, do you have any difficulty performing the following activities: 04/03/2021  Hearing? N  Vision? N  Difficulty concentrating or making decisions? N  Walking or climbing stairs? N  Dressing or bathing? N  Doing errands, shopping? N  Preparing Food and eating ? N  Using the Toilet? N  In the past six months, have you accidently leaked urine? N  Do you have problems with loss of bowel control? N  Managing your Medications? N  Managing your Finances? N  Housekeeping or managing your Housekeeping? N  Some recent data might be hidden    Patient Care Team: Hoyt Koch, MD as PCP - General (Internal Medicine) [place], Darnelle Maffucci, [facility]  as Pharmacist (Pharmacist) Katy Apo, MD as Consulting Physician (Ophthalmology)  Indicate any recent Medical Services you may have received from other than [facility] providers in the past year (date may be approximate).     Assessment:   This is a routine wellness examination for Raven Haynes.  Hearing/Vision screen Hearing Screening - Comments:: Patient denied any hearing difficulty.   No hearing aids.  Vision Screening - Comments:: Patient wears corrective glasses/contacts.  Eye exam done annually by: Katy Apo, MD.  Dietary issues and exercise activities discussed: Current Exercise Habits: Home exercise routine, Type of exercise: walking;Other - see comments (stair climbing), Time (Minutes): 30, Frequency (Times/Week): 5, Weekly Exercise (Minutes/Week): 150, Intensity: Moderate, Exercise limited by: Other -  see comments (Rheumatoid arthritis)   Goals Addressed               This Visit's Progress     Patient Stated (pt-stated)        My goal is to lose some weight and also increase more walking to help control my arthritis.      Depression Screen PHQ 2/9 Scores 04/03/2021 04/03/2021 02/23/2020 02/23/2020 12/19/2018 12/13/2017 12/13/2017  PHQ - 2 Score 0 0 0 1 0 0 0    Fall Risk Fall Risk  04/03/2021 04/03/2021 02/23/2020 02/23/2020 12/21/2019  Falls in the past year? 0 1 0 0 -  Number falls in past yr: 0 0 0 0 0  Injury with Fall? 0 0 0 0 1  Risk for fall due to : No Fall Risks - No Fall Risks - No Fall Risks  Follow up Falls evaluation completed - Falls evaluation completed - Falls evaluation completed    [place]:  Any stairs in or around the home? Yes  If so, are there any without handrails? No  Home free of loose throw rugs in walkways, pet beds, electrical cords, etc? Yes  Adequate lighting in your home to reduce risk of falls? Yes   ASSISTIVE DEVICES UTILIZED TO PREVENT FALLS:  Life alert? No  Use of a cane, walker or w/c? No  Grab  bars in the bathroom? Yes  Shower chair or bench in shower? Yes  Elevated toilet seat or a handicapped toilet? Yes   TIMED UP AND GO:  Was the test performed? Yes .  Length of time to ambulate 10 feet: 6 sec.   Gait steady and fast without use of assistive device  Cognitive Function: Normal cognitive status assessed by direct observation by this Nurse Health Advisor. No abnormalities found.          Immunizations Immunization History  Administered Date(s) Administered   Fluad Quad(high Dose 65+) 12/19/2018, 12/04/2019   Influenza, High Dose Seasonal PF 12/10/2016, 12/13/2017   Influenza,inj,Quad PF,6+ Mos 11/15/2015   PFIZER(Purple Top)SARS-COV-2 Vaccination 03/31/2019, 04/18/2019   Pneumococcal Conjugate-13 03/22/2015   Pneumococcal Polysaccharide-23 03/10/2014   Tdap 09/09/2011   Zoster Recombinat (Shingrix) 10/04/2017   Zoster, Live 12/24/2013    TDAP status: Up to date  Flu Vaccine status: Due, Education has been provided regarding the importance of this vaccine. Advised may receive this vaccine at local pharmacy or Health Dept. Aware to provide a copy of the vaccination record if obtained from local pharmacy or Health Dept. Verbalized acceptance and understanding.  Pneumococcal vaccine status: Up to date  Covid-19 vaccine status: Completed vaccines  Qualifies for Shingles Vaccine? Yes   Zostavax completed Yes   Shingrix Completed?: No.    Education has been provided regarding the importance of this vaccine. Patient has been advised to call insurance company to determine out of pocket expense if they have not yet received this vaccine. Advised may also receive vaccine at local pharmacy or Health Dept. Verbalized acceptance and understanding.  Screening Tests Health Maintenance  Topic Date Due   Zoster Vaccines- Shingrix (2 of 2) 11/29/2017   Pneumonia Vaccine 2+ Years old (3 - PPSV23 if available, else PCV20) 03/11/2019   INFLUENZA VACCINE  10/10/2020    TETANUS/TDAP  09/08/2021   MAMMOGRAM  10/27/2021   COLONOSCOPY (Pts 45-68yrs Insurance coverage will need to be confirmed)  07/10/2023   DEXA SCAN  Completed   Hepatitis C Screening  Completed   HPV VACCINES  Aged Out   COVID-19 Vaccine  Discontinued    Health Maintenance  Health Maintenance Due  Topic Date Due   Zoster Vaccines- Shingrix (2 of 2) 11/29/2017   Pneumonia Vaccine 65+ Years old (3 - PPSV23 if available, else PCV20) 03/11/2019   INFLUENZA VACCINE  10/10/2020    Colorectal cancer screening: Type of screening: Colonoscopy. Completed 07/09/2013. Repeat every 10 years  Mammogram status: Completed 10/28/2019. Repeat every year  Bone Density status: Completed 02/25/2020. Results reflect: Bone density results: OSTEOPENIA. Repeat every 2-3 years.  Lung Cancer Screening: (Low Dose CT Chest recommended if Age 37-80 years, 30 pack-year currently smoking OR have quit w/in 15years.) does not qualify.   Lung Cancer Screening Referral: no  Additional Screening:  Hepatitis C Screening: does qualify; Completed yes  Vision Screening: Recommended annual ophthalmology exams for early detection of glaucoma and other disorders of the eye. Is the patient up to date with their annual eye exam?  Yes  Who is the provider or what is the name of the office in which the patient attends annual eye exams? Katy Apo, MD. If pt is not established with a provider, would they like to be referred to a provider to establish care? No .   Dental Screening: Recommended annual dental exams for proper oral hygiene  Community Resource Referral / Chronic Care Management: CRR required this visit?  No   CCM required this visit?  No      Plan:     I have personally reviewed and noted the following in the patients chart:   Medical and social history Use of alcohol, tobacco or illicit drugs  Current medications and supplements including opioid prescriptions.  Functional ability and  status Nutritional status Physical activity Advanced directives List of other physicians Hospitalizations, surgeries, and ER visits in previous 12 months Vitals Screenings to include cognitive, depression, and falls Referrals and appointments  In addition, I have reviewed and discussed with patient certain preventive protocols, quality metrics, and best practice recommendations. A written personalized care plan for preventive services as well as general preventive health recommendations were provided to patient.     Sheral Flow, LPN   [id number]   Nurse Notes:  Hearing Screening - Comments:: Patient denied any hearing difficulty.   No hearing aids.  Vision Screening - Comments:: Patient wears corrective glasses/contacts.  Eye exam done annually by: Katy Apo, MD.

## 2021-04-03 NOTE — Patient Instructions (Signed)
We will check the labs. The bone density is this December. You can get the mammogram anytime.

## 2021-04-03 NOTE — Assessment & Plan Note (Signed)
BP at goal on hctz 25 mg daily and losartan 25 mg daily. Recent CMP at goal, checking lipid panel.

## 2021-04-03 NOTE — Progress Notes (Signed)
° °  Subjective:   Patient ID: Raven Haynes, female    DOB: May 26, 1950, 71 y.o.   MRN: [id number]  HPI The patient is a 71 YO female coming in for physical.   PMH, [place], social history reviewed and updated  Review of Systems  Constitutional: Negative.   HENT: Negative.    Eyes: Negative.   Respiratory:  Positive for cough. Negative for chest tightness and shortness of breath.   Cardiovascular:  Negative for chest pain, palpitations and leg swelling.  Gastrointestinal:  Negative for abdominal distention, abdominal pain, constipation, diarrhea, nausea and vomiting.  Musculoskeletal: Negative.   Skin: Negative.   Neurological: Negative.   Psychiatric/Behavioral: Negative.     Objective:  Physical Exam Constitutional:      Appearance: She is well-developed.  HENT:     Head: Normocephalic and atraumatic.  Cardiovascular:     Rate and Rhythm: Normal rate and regular rhythm.  Pulmonary:     Effort: Pulmonary effort is normal. No respiratory distress.     Breath sounds: Normal breath sounds. No wheezing or rales.  Abdominal:     General: Bowel sounds are normal. There is no distension.     Palpations: Abdomen is soft.     Tenderness: There is no abdominal tenderness. There is no rebound.  Musculoskeletal:     Cervical back: Normal range of motion.  Skin:    General: Skin is warm and dry.  Neurological:     Mental Status: She is alert and oriented to person, place, and time.     Coordination: Coordination normal.    Vitals:   04/03/21 1359  BP: 132/82  Pulse: 60  Resp: 18  Temp: 98.2 F (36.8 C)  TempSrc: Oral  SpO2: 97%  Weight: 159 lb 12.8 oz (72.5 kg)  Height: 5\' 2"  (1.575 m)   This visit occurred during the SARS-CoV-2 public health emergency.  Safety protocols were in place, including screening questions prior to the visit, additional usage of staff PPE, and extensive cleaning of exam room while observing appropriate contact time as indicated for disinfecting solutions.    Assessment & Plan:

## 2021-04-03 NOTE — Assessment & Plan Note (Signed)
Needs HgA1c for monitoring as not done in some time.

## 2021-04-18 NOTE — Progress Notes (Signed)
Office Visit Note  Patient: Raven Haynes             Date of Birth: [birth year]           MRN: [id number]             PCP: Hoyt Koch, MD Referring: Hoyt Koch, * Visit Date: 04/19/2021   Subjective:  Follow-up (Doing good)   History of Present Illness: Denell Qualls is a 71 y.o. 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 She feels joint symptoms are under control. She is staying very active chasing her grandchildren. She has noticed some worsening left eye vision blurriness when wearing contact lenses, but not when wearing glasses. She denies any pain, redness, or swelling on or around the eye.  Previous HPI 01/18/21 Geryl Scherr is a 71 y.o. 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 Last month she suffered fracture to her right 7th and 8th ribs.  Besides this no major worsening of joint pain or swelling.  New rash on the face without significant symptoms. No oral ulcers or pain. There is been no recurrence of the previous arm and truncal rash.   Previous HPI   02/17/20 Rylynn Creedon is a 71 y.o. female here for follow of seropositive RA since starting methotrexate about 6 weeks ago and on prednisone 10mg  daily. She was also seen in the interval by Dr. Junius Roads for right foot injection. She did not have any significant benefit. She was then recommended to have MRI for further assessment but had trouble getting this with insurance declined. She tolerated decreasing prednisone to 5 mg daily without problems. Her hands feel fine but her foot pain is severely limiting weight bearing activities.   Review of Systems  Constitutional:  Negative for fatigue.  HENT:  Negative for mouth dryness.   Eyes:  Negative for dryness.  Respiratory:  Negative for shortness of breath.   Cardiovascular:  Negative for swelling in legs/feet.  Gastrointestinal:  Negative for constipation.  Endocrine: Negative for excessive  thirst.  Genitourinary:  Negative for difficulty urinating.  Musculoskeletal:  Positive for gait problem and morning stiffness.  Skin:  Negative for rash.  Allergic/Immunologic: Negative for susceptible to infections.  Neurological:  Negative for numbness.  Hematological:  Negative for bruising/bleeding tendency.  Psychiatric/Behavioral:  Negative for sleep disturbance.    PMFS History:  Patient Active Problem List   Diagnosis Date Noted   Blurred vision, left eye 04/19/2021   Rash and other nonspecific skin eruption 10/18/2020   Pain in right ankle and joints of right foot 02/17/2020   Rheumatoid arthritis with rheumatoid factor of multiple sites without organ or systems involvement ([place]) 12/30/2019   High risk medication use 12/30/2019   Right knee pain 12/03/2019   Lesion of nose 05/22/2019   Impaired fasting blood sugar 12/19/2018   Routine general medical examination at a health care facility 11/15/2015   Essential hypertension 03/22/2015    Past Medical History:  Diagnosis Date   Hypertension     Family History  Problem Relation Age of Onset   Diabetes Mother    Diabetes Sister    Breast cancer Neg Hx    Past Surgical History:  Procedure Laterality Date   KNEE SURGERY Bilateral 2004,2009   Social History   Social History Narrative   Not on file   Immunization History  Administered Date(s) Administered   Fluad Quad(high Dose 65+) 12/19/2018,  12/04/2019   Influenza, High Dose Seasonal PF 12/10/2016, 12/13/2017   Influenza,inj,Quad PF,6+ Mos 11/15/2015   PFIZER(Purple Top)SARS-COV-2 Vaccination 03/31/2019, 04/18/2019   Pneumococcal Conjugate-13 03/22/2015   Pneumococcal Polysaccharide-23 03/10/2014   Tdap 09/09/2011   Zoster Recombinat (Shingrix) 10/04/2017   Zoster, Live 12/24/2013     Objective: Vital Signs: BP (!) 165/90 (BP Location: Left Arm, Patient Position: Sitting, Cuff Size: Normal)    Pulse 76    Resp 15    Ht 5\' 2"  (1.575 m)    Wt 159 lb (72.1  kg)    BMI 29.08 kg/m    Physical Exam Eyes:     Extraocular Movements: Extraocular movements intact.     Conjunctiva/sclera: Conjunctivae normal.     Pupils: Pupils are equal, round, and reactive to light.  Cardiovascular:     Rate and Rhythm: Normal rate and regular rhythm.  Pulmonary:     Effort: Pulmonary effort is normal.     Breath sounds: Normal breath sounds.  Musculoskeletal:     Right lower leg: No edema.     Left lower leg: No edema.  Skin:    General: Skin is warm and dry.     Findings: No rash.  Neurological:     General: No focal deficit present.     Mental Status: She is alert.  Psychiatric:        Mood and Affect: Mood normal.     Musculoskeletal Exam:  Shoulders full ROM no tenderness or swelling Elbows full ROM no tenderness or swelling Wrists full ROM no tenderness or swelling Fingers full ROM, mild swelling without tenderness to palpation over right 2nd-3rd MCPs Knees full ROM no tenderness or swelling Ankles full ROM no tenderness or swelling   CDAI Exam: CDAI Score: 5  Patient Global: 10 mm; Provider Global: 20 mm Swollen: 2 ; Tender: 0  Joint Exam 04/19/2021      Right  Left  MCP 2  Swollen      MCP 3  Swollen         Investigation: No additional findings.  Imaging: No results found.  Recent Labs: Lab Results  Component Value Date   WBC 4.7 01/18/2021   HGB 12.0 01/18/2021   PLT 285 01/18/2021   NA 140 01/18/2021   K 4.1 01/18/2021   CL 104 01/18/2021   CO2 29 01/18/2021   GLUCOSE 95 01/18/2021   BUN 15 01/18/2021   CREATININE 0.73 01/18/2021   BILITOT 0.4 01/18/2021   ALKPHOS 65 12/21/2019   AST 18 01/18/2021   ALT 12 01/18/2021   PROT 7.0 01/18/2021   ALBUMIN 4.5 12/21/2019   CALCIUM 9.5 01/18/2021   GFRAA 93 06/22/2020   QFTBGOLDPLUS NEGATIVE 12/30/2019    Speciality Comments: No specialty comments available.  Procedures:  No procedures performed Allergies: Patient has no known allergies.   Assessment / Plan:      Visit Diagnoses: Rheumatoid arthritis with rheumatoid factor of multiple sites without organ or systems involvement ([place]) - Plan: Sedimentation rate, methotrexate (RHEUMATREX) 2.5 MG tablet  Symptoms appear well controlled again today there is some mild chronic MCP swelling.  Checking sedimentation rate for activity monitoring.  We will plan to continue the methotrexate 20 mg p.o. weekly and folic acid 1 mg daily.  High risk medication use - Plan: CBC with Differential/Platelet, COMPLETE METABOLIC PANEL WITH GFR, folic acid (FOLVITE) 1 MG tablet  Checking CBC and CMP today for methotrexate medication monitoring.  Blurred vision, left eye  Not clear cause,  blurriness in only one eye and only with contact lens in. I don't see any changes concerning me for scleritis. Could have dryness or possibly needs prescription adjusted. I recommended she follow up with her eye doctor no red flags needing urgent changes.  Orders: Orders Placed This Encounter  Procedures   Sedimentation rate   CBC with Differential/Platelet   COMPLETE METABOLIC PANEL WITH GFR   Meds ordered this encounter  Medications   folic acid (FOLVITE) 1 MG tablet    Sig: Take 1 tablet (1 mg total) by mouth daily.    Dispense:  90 tablet    Refill:  3   methotrexate (RHEUMATREX) 2.5 MG tablet    Sig: Take 8 tablets (20 mg total) by mouth once a week. Caution:Chemotherapy. Protect from light.    Dispense:  40 tablet    Refill:  2     Follow-Up Instructions: Return in about 3 months (around 07/17/2021) for RA on MTX f/u 51mos.   Collier Salina, MD  Note - This record has been created using [employer].  Chart creation errors have been sought, but may not always  have been located. Such creation errors do not reflect on  the standard of medical care.

## 2021-04-19 ENCOUNTER — Encounter: Payer: Self-pay | Admitting: Internal Medicine

## 2021-04-19 ENCOUNTER — Other Ambulatory Visit: Payer: Self-pay

## 2021-04-19 ENCOUNTER — Ambulatory Visit: Payer: Medicare HMO | Admitting: Internal Medicine

## 2021-04-19 VITALS — BP 165/90 | HR 76 | Resp 15 | Ht 62.0 in | Wt 159.0 lb

## 2021-04-19 DIAGNOSIS — H538 Other visual disturbances: Secondary | ICD-10-CM | POA: Diagnosis not present

## 2021-04-19 DIAGNOSIS — M0579 Rheumatoid arthritis with rheumatoid factor of multiple sites without organ or systems involvement: Secondary | ICD-10-CM | POA: Diagnosis not present

## 2021-04-19 DIAGNOSIS — Z79899 Other long term (current) drug therapy: Secondary | ICD-10-CM

## 2021-04-19 LAB — CBC WITH DIFFERENTIAL/PLATELET
Absolute Monocytes: 585 cells/uL (ref 200–950)
Basophils Absolute: 41 cells/uL (ref 0–200)
Basophils Relative: 0.9 %
Eosinophils Absolute: 333 cells/uL (ref 15–500)
Eosinophils Relative: 7.4 %
HCT: 36.7 % (ref 35.0–45.0)
Hemoglobin: 12.3 g/dL (ref 11.7–15.5)
Lymphs Abs: 1418 cells/uL (ref 850–3900)
MCH: 30.9 pg (ref 27.0–33.0)
MCHC: 33.5 g/dL (ref 32.0–36.0)
MCV: 92.2 fL (ref 80.0–100.0)
MPV: 10.3 fL (ref 7.5–12.5)
Monocytes Relative: 13 %
Neutro Abs: 2124 cells/uL (ref 1500–7800)
Neutrophils Relative %: 47.2 %
Platelets: 274 10*3/uL (ref 140–400)
RBC: 3.98 10*6/uL (ref 3.80–5.10)
RDW: 14.6 % (ref 11.0–15.0)
Total Lymphocyte: 31.5 %
WBC: 4.5 10*3/uL (ref 3.8–10.8)

## 2021-04-19 LAB — COMPLETE METABOLIC PANEL WITH GFR
AG Ratio: 1.7 (calc) (ref 1.0–2.5)
ALT: 11 U/L (ref 6–29)
AST: 16 U/L (ref 10–35)
Albumin: 4.5 g/dL (ref 3.6–5.1)
Alkaline phosphatase (APISO): 64 U/L (ref 37–153)
BUN: 16 mg/dL (ref 7–25)
CO2: 28 mmol/L (ref 20–32)
Calcium: 9.4 mg/dL (ref 8.6–10.4)
Chloride: 104 mmol/L (ref 98–110)
Creat: 0.8 mg/dL (ref 0.60–1.00)
Globulin: 2.7 g/dL (calc) (ref 1.9–3.7)
Glucose, Bld: 86 mg/dL (ref 65–99)
Potassium: 4.1 mmol/L (ref 3.5–5.3)
Sodium: 140 mmol/L (ref 135–146)
Total Bilirubin: 0.4 mg/dL (ref 0.2–1.2)
Total Protein: 7.2 g/dL (ref 6.1–8.1)
eGFR: 79 mL/min/{1.73_m2} (ref >=60)

## 2021-04-19 LAB — SEDIMENTATION RATE: Sed Rate: 11 mm/h (ref 0–30)

## 2021-04-19 MED ORDER — METHOTREXATE 2.5 MG PO TABS: 20.0000 mg | ORAL_TABLET | ORAL | 2 refills | Status: AC

## 2021-04-19 MED ORDER — FOLIC ACID 1 MG PO TABS: 1.0000 mg | ORAL_TABLET | Freq: Every day | ORAL | 3 refills | Status: DC

## 2021-04-21 NOTE — Progress Notes (Signed)
Lab results are all normal so no problems continuing methotrexate.

## 2021-05-10 ENCOUNTER — Telehealth: Payer: Self-pay

## 2021-05-10 NOTE — Progress Notes (Signed)
? ? ?  Chronic Care Management ?Pharmacy Assistant  ? ?Name: Raven Haynes  MRN: [id number] DOB: Oct 23, 1950 ? ?Raven Haynes is an 71 y.o. year old female who presents for his follow-up CCM visit with the clinical pharmacist. ? ?Reason for Encounter: Disease State-General  ?  ? ?Recent office visits:  ?04/03/21 Raven Koch, MD-PCP (Annual exam) Blood work ordered ? ?Recent consult visits:  ?04/19/21 Collier Salina, MD-Rheumatology (Arthritis) Blood work ordered, no med changes ?01/18/21  Haynes, Raven Miner, MD-Rheumatology (Arthritis) Labs:CBC and CMP, med changes: methotrexate sodium 20 mg weekly ?Hospital visits:  ?None in previous 6 months ? ?Medications: ?Outpatient Encounter Medications as of 05/10/2021  ?Medication Sig  ? Apple Cider Vinegar 600 MG CAPS Take 1 capsule by mouth daily.  ? B Complex-C (SUPER B COMPLEX PO) Take by mouth.  ? Calcium Citrate-Vitamin D (CITRACAL + D PO) Take 2 tablets by mouth daily. Total daily dose = Calcium 650mg  - Vitamin D3 - 1000 units-  Zn - 5.5mg   ? Cholecalciferol (D3-1000 PO) Take by mouth daily.  ? Cyanocobalamin (VITAMIN B 12 PO) Take 1,000 mcg by mouth daily.  ? folic acid (FOLVITE) 1 MG tablet Take 1 tablet (1 mg total) by mouth daily.  ? gabapentin (NEURONTIN) 100 MG capsule Take 1 capsule (100 mg total) by mouth 3 (three) times daily.  ? Glucosamine-Chondroitin (MOVE FREE PO) Take 1 tablet by mouth daily.  ? hydrochlorothiazide (HYDRODIURIL) 25 MG tablet Take 1 tablet (25 mg total) by mouth daily.  ? losartan (COZAAR) 25 MG tablet Take 1 tablet (25 mg total) by mouth daily.  ? Magnesium 400 MG CAPS Take by mouth.  ? methotrexate (RHEUMATREX) 2.5 MG tablet Take 8 tablets (20 mg total) by mouth once a week. Caution:Chemotherapy. Protect from light.  ? promethazine-dextromethorphan (PROMETHAZINE-DM) 6.25-15 MG/5ML syrup Take 5 mLs by mouth 4 (four) times daily as needed for cough.  ? Red Yeast Haynes Extract (RED YEAST Haynes PO) Take 1 tablet by mouth daily. Total daily  dose = 600 mg  ? TURMERIC PO Take 1,000 mg by mouth daily.  ? Zinc Sulfate (ZINC 15 PO) Take 50 mg by mouth daily.  ? ?No facility-administered encounter medications on file as of 05/10/2021.  ? ?Have you had any problems recently with your health? Patient states that she is doing well. She states that she takes all of her medications regularly like she is suppose to. ? ?Have you had any problems with your pharmacy?Patient states that she does not have any problems with getting medications or the cost of medications from the pharmacy ? ?What issues or side effects are you having with your medications?Patient states that she has not side effects from medications  ? ?What would you like me to pass along to [employer] for them to help you with? Patient states that she is well and does not have any new concerns about health or medications ? ?What can we do to take care of you better? Patient states she does not need anything at this time ? ?Care Gaps: ?Colonoscopy-06/08/13 ?Diabetic Foot Exam-NA ?Mammogram-03/13/21 ?Ophthalmology-NA ?Dexa Scan - NA ?Annual Well Visit - 04/03/21 ?Micro albumin-NA ?Hemoglobin A1c- 04/03/21 ? ?Star Rating Drugs: ?Losartan 25 mg-last fill 04/19/21 90 ds ? ? ?Ethelene Hal ?Clinical Pharmacist Assistant ?[phone]  ?

## 2021-05-19 ENCOUNTER — Telehealth: Payer: Self-pay | Admitting: Internal Medicine

## 2021-05-19 ENCOUNTER — Ambulatory Visit (INDEPENDENT_AMBULATORY_CARE_PROVIDER_SITE_OTHER): Payer: Medicare HMO

## 2021-05-19 DIAGNOSIS — I10 Essential (primary) hypertension: Secondary | ICD-10-CM

## 2021-05-19 NOTE — Telephone Encounter (Signed)
Pt states she did not hear her phone ring for 3-10 telephone appt ? ?Pt requesting a cb to reschedule the appt ?

## 2021-05-19 NOTE — Progress Notes (Cosign Needed)
Chronic Care Management Pharmacy Note  05/19/2021 Name:  Raven Haynes MRN:  [id number] DOB:  May 17, 1950  Summary: - Patient reports that she has been doing well since last visit, reports compliance to current medications  -Notes that BP has been at goal, averaging 130-135/70-75  -continues following with rheumatology - mtx and folic acid remain unchanged   Recommendations/Changes made from today's visit: - Recommending no changes at this time, patient will continue to monitor blood pressure and reach out should BP >140/90  Subjective: Raven Haynes is an 71 y.o. year old female who is a primary patient of Hoyt Koch, MD.  The CCM team was consulted for assistance with disease management and care coordination needs.    Engaged with patient by telephone for follow up visit in response to provider referral for pharmacy case management and/or care coordination services.   Consent to Services:  The patient was given the following information about Chronic Care Management services today, agreed to services, and gave verbal consent: 1. CCM service includes personalized support from designated clinical staff supervised by the primary care provider, including individualized plan of care and coordination with other care providers 2. 24/7 contact phone numbers for assistance for urgent and routine care needs. 3. Service will only be billed when office clinical staff spend 20 minutes or more in a month to coordinate care. 4. Only one practitioner may furnish and bill the service in a calendar month. 5.The patient may stop CCM services at any time (effective at the end of the month) by phone call to the office staff. 6. The patient will be responsible for cost sharing (co-pay) of up to 20% of the service fee (after annual deductible is met). Patient agreed to services and consent obtained.  Patient Care Team: Hoyt Koch, MD as PCP - General (Internal Medicine) Delice Bison, Darnelle Maffucci, [facility] as  Pharmacist (Pharmacist) Katy Apo, MD as Consulting Physician (Ophthalmology)  BP Readings from Last 3 Encounters:  04/19/21 (!) 165/90  04/03/21 132/82  04/03/21 140/90     Recent office visits: 04/03/2021 - Dr. Sharlet Salina - no changes to medications - patient declined tdap, shingrix, and pna vaccine   Recent consult visits: 04/19/2021 - Dr. Benjamine Mola - Rheumatology - continue current medications - f/u in 3 months  01/18/2021 - Dr. Benjamine Mola - rheumatology - no changes to medications - f/u in 3 months   Hospital visits: 12/23/2020 - Urgent care - fracture of 7 and 8th ribs on right side   Objective:  Lab Results  Component Value Date   CREATININE 0.80 04/19/2021   BUN 16 04/19/2021   GFR 87.44 12/21/2019   GFRNONAA 80 06/22/2020   GFRAA 93 06/22/2020   NA 140 04/19/2021   K 4.1 04/19/2021   CALCIUM 9.4 04/19/2021   CO2 28 04/19/2021   GLUCOSE 86 04/19/2021    Lab Results  Component Value Date/Time   HGBA1C 6.2 04/03/2021 03:04 PM   HGBA1C 6.1 02/23/2020 09:37 AM   GFR 87.44 12/21/2019 02:40 PM   GFR 77.93 12/18/2019 08:50 AM    Last diabetic Eye exam:  No results found for: HMDIABEYEEXA  Last diabetic Foot exam:  No results found for: HMDIABFOOTEX   Lab Results  Component Value Date   CHOL 197 04/03/2021   HDL 44.50 04/03/2021   LDLCALC 116 (H) 04/03/2021   TRIG 184.0 (H) 04/03/2021   CHOLHDL 4 04/03/2021    Hepatic Function Latest Ref Rng & Units 04/19/2021 01/18/2021 10/19/2020  Total Protein 6.1 -  8.1 g/dL 7.2 7.0 7.1  Albumin 3.5 - 5.2 g/dL - - -  AST 10 - 35 U/L '16 18 15  ' ALT 6 - 29 U/L '11 12 13  ' Alk Phosphatase 39 - 117 U/L - - -  Total Bilirubin 0.2 - 1.2 mg/dL 0.4 0.4 0.6    No results found for: TSH, FREET4  CBC Latest Ref Rng & Units 04/19/2021 01/18/2021 10/19/2020  WBC 3.8 - 10.8 Thousand/uL 4.5 4.7 3.9  Hemoglobin 11.7 - 15.5 g/dL 12.3 12.0 12.5  Hematocrit 35.0 - 45.0 % 36.7 37.1 38.6  Platelets 140 - 400 Thousand/uL 274 285 266    No results  found for: VD25OH  Clinical ASCVD: No  The 10-year ASCVD risk score (Arnett DK, et al., 2019) is: 22.8%   Values used to calculate the score:     Age: 22 years     Sex: Female     Is Non-Hispanic African American: No     Diabetic: No     Tobacco smoker: No     Systolic Blood Pressure: 115 mmHg     Is BP treated: Yes     HDL Cholesterol: 44.5 mg/dL     Total Cholesterol: 197 mg/dL    Depression screen [facility] 2/9 04/03/2021 04/03/2021 02/23/2020  Decreased Interest 0 0 0  Down, Depressed, Hopeless 0 0 0  PHQ - 2 Score 0 0 0     Social History   Tobacco Use  Smoking Status Never  Smokeless Tobacco Never   BP Readings from Last 3 Encounters:  04/19/21 (!) 165/90  04/03/21 132/82  04/03/21 140/90   Pulse Readings from Last 3 Encounters:  04/19/21 76  04/03/21 60  04/03/21 (!) 58   Wt Readings from Last 3 Encounters:  04/19/21 159 lb (72.1 kg)  04/03/21 159 lb 12.8 oz (72.5 kg)  04/03/21 159 lb 12.8 oz (72.5 kg)   BMI Readings from Last 3 Encounters:  04/19/21 29.08 kg/m  04/03/21 29.23 kg/m  04/03/21 29.23 kg/m    Assessment/Interventions: Review of patient past medical history, allergies, medications, health status, including review of consultants reports, laboratory and other test data, was performed as part of comprehensive evaluation and provision of chronic care management services.   SDOH:  (Social Determinants of Health) assessments and interventions performed: Yes  SDOH Screenings   Alcohol Screen: Low Risk    Last Alcohol Screening Score (AUDIT): 2  Depression (PHQ2-9): Low Risk    PHQ-2 Score: 0  Financial Resource Strain: Low Risk    Difficulty of Paying Living Expenses: Not hard at all  Food Insecurity: No Food Insecurity   Worried About Charity fundraiser in the Last Year: Never true   Ran Out of Food in the Last Year: Never true  Housing: Low Risk    Last Housing Risk Score: 0  Physical Activity: Sufficiently Active   Days of Exercise per  Week: 5 days   Minutes of Exercise per Session: 30 min  Social Connections: Engineer, building services of Communication with Friends and Family: More than three times a week   Frequency of Social Gatherings with Friends and Family: More than three times a week   Attends Religious Services: More than 4 times per year   Active Member of [employer] or Organizations: Yes   Attends Music therapist: More than 4 times per year   Marital Status: Married  Stress: No Stress Concern Present   Feeling of Stress : Not at all  Tobacco Use: Low Risk    Smoking Tobacco Use: Never   Smokeless Tobacco Use: Never   Passive Exposure: Not on file  Transportation Needs: No Transportation Needs   Lack of Transportation (Medical): No   Lack of Transportation (Non-Medical): No    CCM Care Plan  No Known Allergies  Medications Reviewed Today     Reviewed by Collier Salina, MD (Physician) on 04/19/21 at 1437  Med List Status: <None>   Medication Order Taking? Sig Documenting Provider Last Dose Status Informant  Apple Cider Vinegar 600 MG CAPS [id number] Yes Take 1 capsule by mouth daily. [provider] Taking Active   B Complex-C (SUPER B COMPLEX PO) [id number] Yes Take by mouth. [provider] Taking Active   Calcium Citrate-Vitamin D (CITRACAL + D PO) [id number] Yes Take 2 tablets by mouth daily. Total daily dose = Calcium 611m - Vitamin D3 - 1000 units-  Zn - 5.590mProvider, Historical, MD Taking Active   Cholecalciferol (D3-1000 PO) [id number] Take by mouth daily. [provider] Taking Active   Cyanocobalamin (VITAMIN B 12 PO) [id number] Take 1,000 mcg by mouth daily. [provider] Taking Active   folic acid (FOLVITE) 1 MG tablet [id number] Take 1 tablet (1 mg total) by mouth daily. RiCollier SalinaMD Taking Active   gabapentin (NEURONTIN) 100 MG capsule [id number] Take 1 capsule (100 mg total) by mouth 3 (three) times daily.  EvEdrick KinsDPM Taking Active   Glucosamine-Chondroitin (MOVE FREE PO) [id number] Take 1 tablet by mouth daily. [provider] Taking Active   hydrochlorothiazide (HYDRODIURIL) 25 MG tablet [id number] Take 1 tablet (25 mg total) by mouth daily. CrHoyt KochMD Taking Active   losartan (COZAAR) 25 MG tablet [id number] Take 1 tablet (25 mg total) by mouth daily. CrHoyt KochMD Taking Active   Magnesium 400 MG CAPS [id number] Take by mouth. [provider] Taking Active   methotrexate (RHEUMATREX) 2.5 MG tablet [id number] 8 tablets (20 mg total) by mouth once a week. Caution: Chemotherapy. Protect from light. RiCollier SalinaMD  Expired 04/18/21 2359   promethazine-dextromethorphan (PROMETHAZINE-DM) 6.25-15 MG/5ML syrup [id number] Take 5 mLs by mouth 4 (four) times daily as needed for cough. CrHoyt KochMD Taking Active   Red Yeast Rice Extract (RED YEAST RICE PO) [id number] Take 1 tablet by mouth daily. Total daily dose = 600 mg [provider] Taking Active   TURMERIC PO [id number] Take 1,000 mg by mouth daily. [provider] Taking Active   Zinc Sulfate (ZINC 15 PO) [id number] Take 50 mg by mouth daily. [provider] Taking Active             Patient Active Problem List   Diagnosis Date Noted   Blurred vision, left eye 04/19/2021   Rash and other nonspecific skin eruption 10/18/2020   Pain in right ankle and joints of right foot 02/17/2020   Rheumatoid arthritis with rheumatoid factor of multiple sites without organ or systems involvement [place]/20[place]   High risk medication use 12/30/2019   Right knee pain 12/03/2019   Lesion of nose 05/22/2019   Impaired fasting blood sugar 12/19/2018   Routine general medical examination at a health care facility 11/15/2015   Essential hypertension 03/22/2015    Immunization History  Administered Date(s) Administered   Fluad  Quad(high Dose 65+) 12/19/2018, 12/04/2019   Influenza, High Dose Seasonal PF 12/10/2016,  12/13/2017   Influenza,inj,Quad PF,6+ Mos 11/15/2015   PFIZER(Purple Top)SARS-COV-2 Vaccination 03/31/2019, 04/18/2019   Pneumococcal Conjugate-13 03/22/2015   Pneumococcal Polysaccharide-23 03/10/2014   Tdap 09/09/2011   Zoster Recombinat (Shingrix) 10/04/2017   Zoster, Live 12/24/2013    Conditions to be addressed/monitored:  Hypertension, Prediabetes, and Rheumatoid Arthritis  There are no care plans that you recently modified to display for this patient.   Medication Assistance: None required.  Patient affirms current coverage meets needs.  Compliance/Adherence/Medication fill history: Care Gaps: Due for COVID19 booster (3rd shot)  Patient's preferred pharmacy is:  [facility] [id number], [place] [address] [place] [address] Phone: [phone] Fax: [phone]  Uses pill box? Yes Pt endorses 100% compliance  Care Plan and Follow Up Patient Decision:  Patient agrees to Care Plan and Follow-up.  Plan: Telephone follow up appointment with care management team member scheduled for:  6 months and The patient has been provided with contact information for the care management team and has been advised to call with any health related questions or concerns.   [name], PharmD Clinical Pharmacist, [place]

## 2021-05-19 NOTE — Patient Instructions (Signed)
Visit Information ? ?Following are the goals we discussed today:  ? ?Manage My Medicine  ? ?Timeframe:  Long-Range Goal ?Priority:  Medium ?Start Date:   08/23/2020                         ?Expected End Date:   02/22/2022                  ? ?Follow Up Date 11/23/2021 ?  ?- call for medicine refill 2 or 3 days before it runs out ?- keep a list of all the medicines I take; vitamins and herbals too  ?  ?Why is this important?   ?These steps will help you keep on track with your medicines. ?  ?Notes: Patient to keep clinic updated on supplements that she starts ? ? ?Track and Manage My Blood Pressure  ? ?Timeframe:  Long-Range Goal ?Priority:  High ?Start Date: 08/23/2020                            ?Expected End Date: 02/22/2022                    ? ?Follow Up Date 11/23/2021  ?  ?- check blood pressure daily ?- choose a place to take my blood pressure (home, clinic or office, retail store) ?- write blood pressure results in a log or diary  ?  ?Why is this important?   ?You won't feel high blood pressure, but it can still hurt your blood vessels.  ?High blood pressure can cause heart or kidney problems. It can also cause a stroke.  ?Making lifestyle changes like losing a little weight or eating less salt will help.  ?Checking your blood pressure at home and at different times of the day can help to control blood pressure.  ?If the doctor prescribes medicine remember to take it the way the doctor ordered.  ?Call the office if you cannot afford the medicine or if there are questions about it.   ?  ?Notes: Patient to call office should blood pressure be uncontrolled (>140/90) or if she is having issues with hypotension  ? ?Plan: Telephone follow up appointment with care management team member scheduled for:  6 months ?The patient has been provided with contact information for the care management team and has been advised to call with any health related questions or concerns.  ? ?[name], PharmD ?Clinical Pharmacist,  [place]  ? ?Please call the care guide team at [phone] if you need to cancel or reschedule your appointment.  ? ?Patient verbalizes understanding of instructions and care plan provided today and agrees to view in [place]. Active MyChart status confirmed with patient.   ?

## 2021-05-29 ENCOUNTER — Ambulatory Visit
Admission: RE | Admit: 2021-05-29 | Discharge: 2021-05-29 | Disposition: A | Discharge status: Home / Self Care | Payer: Medicare HMO | Source: Ambulatory Visit | Attending: Internal Medicine | Admitting: Internal Medicine

## 2021-05-29 DIAGNOSIS — Z1231 Encounter for screening mammogram for malignant neoplasm of breast: Secondary | ICD-10-CM | POA: Diagnosis not present

## 2021-06-23 DIAGNOSIS — L237 Allergic contact dermatitis due to plants, except food: Secondary | ICD-10-CM | POA: Diagnosis not present

## 2021-07-13 NOTE — Progress Notes (Signed)
? ?Office Visit Note ? ?Patient: Raven Haynes             ?Date of Birth: November 17, 1950           ?MRN: [id number]             ?PCP: Hoyt Koch, MD ?Referring: Hoyt Koch, * ?Visit Date: 07/19/2021 ? ? ?Subjective:  ?Rheumatoid Arthritis (Doing good) ? ? ?History of Present Illness: Raven Haynes is a 71 y.o. 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  She feels well without any flare up of arthritis symptoms. She developed skin rash recently along with her husband and seen in urgent care thought to be poison ivy exposure. She took a 6 day tapering course of prednisone with partial improvement. However still has some active red rashes a month later now. Including on her abdomen notices some extension or new spots spreading above the area. ? ?Previous HPI ?04/19/2021 ?Raven Haynes is a 70 y.o. 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 She feels joint symptoms are under control. She is staying very active chasing her grandchildren. She has noticed some worsening left eye vision blurriness when wearing contact lenses, but not when wearing glasses. She denies any pain, redness, or swelling on or around the eye. ?  ?Previous HPI ?01/18/21 ?Raven Haynes is a 71 y.o. 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 Last month she suffered fracture to her right 7th and 8th ribs.  Besides this no major worsening of joint pain or swelling. New rash on the face without significant symptoms. No oral ulcers or pain. There is been no recurrence of the previous arm and truncal rash. ?  ?Previous HPI   ?02/17/20 ?Raven Haynes is a 71 y.o. female here for follow of seropositive RA since starting methotrexate about 6 weeks ago and on prednisone '10mg'$  daily. She was also seen in the interval by Dr. Junius Roads for right foot injection. She did not have any significant benefit. She was then recommended to have MRI for further  assessment but had trouble getting this with insurance declined. She tolerated decreasing prednisone to 5 mg daily without problems. Her hands feel fine but her foot pain is severely limiting weight bearing activities. ? ? ?Review of Systems  ?Constitutional:  Positive for fatigue.  ?HENT:  Negative for mouth dryness.   ?Eyes:  Positive for dryness.  ?Respiratory:  Negative for shortness of breath.   ?Cardiovascular:  Negative for swelling in legs/feet.  ?Gastrointestinal:  Negative for constipation.  ?Endocrine: Positive for excessive thirst and increased urination.  ?Genitourinary:  Negative for difficulty urinating.  ?Musculoskeletal:  Positive for morning stiffness.  ?Skin:  Positive for rash, nodules/bumps and redness.  ?Allergic/Immunologic: Negative for susceptible to infections.  ?Neurological:  Negative for numbness.  ?Hematological:  Negative for bruising/bleeding tendency.  ?Psychiatric/Behavioral:  Negative for sleep disturbance.   ? ?PMFS History:  ?Patient Active Problem List  ? Diagnosis Date Noted  ? Poison ivy dermatitis 07/19/2021  ? Blurred vision, left eye 04/19/2021  ? Rash and other nonspecific skin eruption 10/18/2020  ? Pain in right ankle and joints of right foot 02/17/2020  ? Rheumatoid arthritis with rheumatoid factor of multiple sites without organ or systems involvement ([place]) 12/30/2019  ? High risk medication use 12/30/2019  ? Right knee pain 12/03/2019  ? Lesion of nose 05/22/2019  ? Impaired fasting blood sugar 12/19/2018  ?  Routine general medical examination at a health care facility 11/15/2015  ? Essential hypertension 03/22/2015  ?  ?Past Medical History:  ?Diagnosis Date  ? Hypertension   ? Rheumatoid arthritis ([place])   ?  ?Family History  ?Problem Relation Age of Onset  ? Diabetes Mother   ? Diabetes Sister   ? Breast cancer Neg Hx   ? ?Past Surgical History:  ?Procedure Laterality Date  ? KNEE SURGERY Bilateral 2004,2009  ? ?Social History  ? ?Social History Narrative  ? Not on  file  ? ?Immunization History  ?Administered Date(s) Administered  ? Fluad Quad(high Dose 65+) 12/19/2018, 12/04/2019  ? Influenza, High Dose Seasonal PF 12/10/2016, 12/13/2017  ? Influenza,inj,Quad PF,6+ Mos 11/15/2015  ? PFIZER(Purple Top)SARS-COV-2 Vaccination 03/31/2019, 04/18/2019  ? Pneumococcal Conjugate-13 03/22/2015  ? Pneumococcal Polysaccharide-23 03/10/2014  ? Tdap 09/09/2011  ? Zoster Recombinat (Shingrix) 10/04/2017  ? Zoster, Live 12/24/2013  ?  ? ?Objective: ?Vital Signs: BP (!) 161/76 (BP Location: Left Arm, Patient Position: Sitting, Cuff Size: Normal)   Pulse 83   Resp 15   Ht '5\' 2"'$  (1.575 m)   Wt 162 lb (73.5 kg)   BMI 29.63 kg/m?   ? ?Physical Exam ?Eyes:  ?   Conjunctiva/sclera: Conjunctivae normal.  ?Cardiovascular:  ?   Rate and Rhythm: Normal rate and regular rhythm.  ?Pulmonary:  ?   Effort: Pulmonary effort is normal.  ?   Breath sounds: Normal breath sounds.  ?Musculoskeletal:  ?   Right lower leg: No edema.  ?   Left lower leg: No edema.  ?Skin: ?   General: Skin is warm and dry.  ?   Findings: Rash present.  ?   Comments: Small patches of irregular shaped erythematous patches on left arm, left side of abdomen, and right upper back ?Resolving slightly hyperpigmented patch in right elbow  ?Neurological:  ?   Mental Status: She is alert.  ?Psychiatric:     ?   Mood and Affect: Mood normal.  ?  ? ?Musculoskeletal Exam:  ?Shoulders full ROM no tenderness or swelling ?Elbows full ROM no tenderness or swelling ?Wrists full ROM no tenderness or swelling ?Fingers full ROM no tenderness or swelling, chronic MCP joints soft tissue thickening ?Knees full ROM no tenderness or swelling patellofemoral crepitus on both sides ?Ankles full ROM no tenderness or swelling ? ? ?CDAI Exam: ?CDAI Score: 0  ?Patient Global: 0 mm; Provider Global: 0 mm ?Swollen: 0 ; Tender: 0  ?Joint Exam 07/19/2021  ? ?All documented joints were normal  ? ? ? ?Investigation: ?No additional findings. ? ?Imaging: ?No results  found. ? ?Recent Labs: ?Lab Results  ?Component Value Date  ? WBC 4.5 04/19/2021  ? HGB 12.3 04/19/2021  ? PLT 274 04/19/2021  ? NA 140 04/19/2021  ? K 4.1 04/19/2021  ? CL 104 04/19/2021  ? CO2 28 04/19/2021  ? GLUCOSE 86 04/19/2021  ? BUN 16 04/19/2021  ? CREATININE 0.80 04/19/2021  ? BILITOT 0.4 04/19/2021  ? ALKPHOS 65 12/21/2019  ? AST 16 04/19/2021  ? ALT 11 04/19/2021  ? PROT 7.2 04/19/2021  ? ALBUMIN 4.5 12/21/2019  ? CALCIUM 9.4 04/19/2021  ? GFRAA 93 06/22/2020  ? QFTBGOLDPLUS NEGATIVE 12/30/2019  ? ? ?Speciality Comments: No specialty comments available. ? ?Procedures:  ?No procedures performed ?Allergies: Patient has no known allergies.  ? ?Assessment / Plan:     ?Visit Diagnoses: Rheumatoid arthritis with rheumatoid factor of multiple sites without organ or systems involvement ([place]) - Plan:  CBC with Differential/Platelet ? ?Doing very well at this time no medication intolerance or symptom flareups.  Previously symptoms returned significantly with medicine discontinuation last year.  Plan to continue the methotrexate 20 mg p.o. weekly. ? ?High risk medication use - methotrexate 20 mg p.o. weekly and folic acid 1 mg daily. - Plan: COMPLETE METABOLIC PANEL WITH GFR, Sedimentation rate ? ?Checking CBC and CMP for methotrexate toxicity monitoring.  Not aware of any specific relationship between methotrexate treatment and delayed clearance of poison ivy dermatitis. ? ?Blurred vision, left eye - Not clear cause, blurriness in only one eye and only with contact lens in.  ? ?She has future eye appointment scheduled next month. ? ?Poison ivy dermatitis - Plan: triamcinolone ointment (KENALOG) 0.5 % ? ?Patchy skin rashes multiple areas most suspicious by history for poison ivy dermatitis.  Incomplete resolution after oral prednisone taper.  Recommend use of triamcinolone ointment 0.5% on affected areas twice daily as needed. ? ?Orders: ?Orders Placed This Encounter  ?Procedures  ? CBC with Differential/Platelet  ?  COMPLETE METABOLIC PANEL WITH GFR  ? Sedimentation rate  ? ?Meds ordered this encounter  ?Medications  ? triamcinolone ointment (KENALOG) 0.5 %  ?  Sig: Apply 1 application. topically 2 (two) times daily as nee

## 2021-07-19 ENCOUNTER — Encounter: Payer: Self-pay | Admitting: Internal Medicine

## 2021-07-19 ENCOUNTER — Ambulatory Visit: Payer: Medicare HMO | Admitting: Internal Medicine

## 2021-07-19 VITALS — BP 161/76 | HR 83 | Resp 15 | Ht 62.0 in | Wt 162.0 lb

## 2021-07-19 DIAGNOSIS — L237 Allergic contact dermatitis due to plants, except food: Secondary | ICD-10-CM | POA: Diagnosis not present

## 2021-07-19 DIAGNOSIS — Z79899 Other long term (current) drug therapy: Secondary | ICD-10-CM

## 2021-07-19 DIAGNOSIS — H538 Other visual disturbances: Secondary | ICD-10-CM

## 2021-07-19 DIAGNOSIS — M0579 Rheumatoid arthritis with rheumatoid factor of multiple sites without organ or systems involvement: Secondary | ICD-10-CM

## 2021-07-19 HISTORY — DX: Allergic contact dermatitis due to plants, except food: L23.7

## 2021-07-19 MED ORDER — TRIAMCINOLONE ACETONIDE 0.5 % EX OINT: 1.0000 "application " | TOPICAL_OINTMENT | Freq: Two times a day (BID) | CUTANEOUS | 0 refills | Status: DC | PRN

## 2021-07-20 LAB — CBC WITH DIFFERENTIAL/PLATELET
Absolute Monocytes: 519 cells/uL (ref 200–950)
Basophils Absolute: 31 cells/uL (ref 0–200)
Basophils Relative: 0.7 %
Eosinophils Absolute: 370 cells/uL (ref 15–500)
Eosinophils Relative: 8.4 %
HCT: 37 % (ref 35.0–45.0)
Hemoglobin: 12.5 g/dL (ref 11.7–15.5)
Lymphs Abs: 1047 cells/uL (ref 850–3900)
MCH: 32 pg (ref 27.0–33.0)
MCHC: 33.8 g/dL (ref 32.0–36.0)
MCV: 94.6 fL (ref 80.0–100.0)
MPV: 10.1 fL (ref 7.5–12.5)
Monocytes Relative: 11.8 %
Neutro Abs: 2433 cells/uL (ref 1500–7800)
Neutrophils Relative %: 55.3 %
Platelets: 258 10*3/uL (ref 140–400)
RBC: 3.91 10*6/uL (ref 3.80–5.10)
RDW: 14.4 % (ref 11.0–15.0)
Total Lymphocyte: 23.8 %
WBC: 4.4 10*3/uL (ref 3.8–10.8)

## 2021-07-20 LAB — COMPLETE METABOLIC PANEL WITH GFR
AG Ratio: 1.8 (calc) (ref 1.0–2.5)
ALT: 18 U/L (ref 6–29)
AST: 21 U/L (ref 10–35)
Albumin: 4.3 g/dL (ref 3.6–5.1)
Alkaline phosphatase (APISO): 75 U/L (ref 37–153)
BUN: 14 mg/dL (ref 7–25)
CO2: 26 mmol/L (ref 20–32)
Calcium: 9.7 mg/dL (ref 8.6–10.4)
Chloride: 105 mmol/L (ref 98–110)
Creat: 0.79 mg/dL (ref 0.60–1.00)
Globulin: 2.4 g/dL (calc) (ref 1.9–3.7)
Glucose, Bld: 83 mg/dL (ref 65–99)
Potassium: 3.9 mmol/L (ref 3.5–5.3)
Sodium: 140 mmol/L (ref 135–146)
Total Bilirubin: 0.4 mg/dL (ref 0.2–1.2)
Total Protein: 6.7 g/dL (ref 6.1–8.1)
eGFR: 79 mL/min/{1.73_m2} (ref >=60)

## 2021-07-20 LAB — SEDIMENTATION RATE: Sed Rate: 11 mm/h (ref 0–30)

## 2021-07-20 NOTE — Progress Notes (Signed)
Lab results look fine for continuing methotrexate 20 mg weekly as before and sedimentation rate remains normal.

## 2021-08-14 ENCOUNTER — Other Ambulatory Visit: Payer: Self-pay | Admitting: Internal Medicine

## 2021-08-14 NOTE — Telephone Encounter (Signed)
Next Visit: 10/25/2021  Last Visit: 07/19/2021  Last Fill: 07/07/2021  DX: Rheumatoid arthritis with rheumatoid factor of multiple sites without organ or systems involvement   Current Dose per office note 07/19/2021: methotrexate 20 mg p.o. weekly   Labs: 07/19/2021, Lab results look fine for continuing methotrexate 20 mg weekly as before and sedimentation rate remains normal.  Okay to refill MTX?

## 2021-09-14 DIAGNOSIS — H5203 Hypermetropia, bilateral: Secondary | ICD-10-CM | POA: Diagnosis not present

## 2021-09-14 DIAGNOSIS — H25013 Cortical age-related cataract, bilateral: Secondary | ICD-10-CM | POA: Diagnosis not present

## 2021-09-14 DIAGNOSIS — H2513 Age-related nuclear cataract, bilateral: Secondary | ICD-10-CM | POA: Diagnosis not present

## 2021-10-12 NOTE — Progress Notes (Signed)
Office Visit Note  Patient: Raven Haynes             Date of Birth: July 01, 1950           MRN: [id number]             PCP: Hoyt Koch, MD Referring: Hoyt Koch, * Visit Date: 10/25/2021   Subjective:  Follow-up (Reaction to methotrexate. )   History of Present Illness: Raven Haynes is a 71 y.o. female here for follow up for seropositive RA on methotrexate 20 mg p.o. weekly.  She feels her arthritis symptoms have been very well controlled.  She is concerned about the medicine since she is noticing breaking out with small mildly itchy raised red skin rashes on her left upper arm these appear to be worse for a few days with each dose of methotrexate that she takes.  She does not recall having any similar rashes in the past.  Previous HPI 07/19/2021 Raven Haynes is a 71 y.o. 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  She feels well without any flare up of arthritis symptoms. She developed skin rash recently along with her husband and seen in urgent care thought to be poison ivy exposure. She took a 6 day tapering course of prednisone with partial improvement. However still has some active red rashes a month later now. Including on her abdomen notices some extension or new spots spreading above the area.   Previous HPI 04/19/2021 Raven Haynes is a 70 y.o. 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 She feels joint symptoms are under control. She is staying very active chasing her grandchildren. She has noticed some worsening left eye vision blurriness when wearing contact lenses, but not when wearing glasses. She denies any pain, redness, or swelling on or around the eye.   Previous HPI 01/18/21 Raven Haynes is a 71 y.o. 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 Last month she suffered fracture to her right 7th and 8th ribs.  Besides this no major worsening of joint pain or  swelling. New rash on the face without significant symptoms. No oral ulcers or pain. There is been no recurrence of the previous arm and truncal rash.   Previous HPI   02/17/20 Raven Haynes is a 71 y.o. female here for follow of seropositive RA since starting methotrexate about 6 weeks ago and on prednisone 87m daily. She was also seen in the interval by Dr. HJunius Roadsfor right foot injection. She did not have any significant benefit. She was then recommended to have MRI for further assessment but had trouble getting this with insurance declined. She tolerated decreasing prednisone to 5 mg daily without problems. Her hands feel fine but her foot pain is severely limiting weight bearing activities.   Review of Systems  Constitutional:  Negative for fatigue.  HENT:  Negative for mouth sores and mouth dryness.   Eyes:  Negative for dryness.  Respiratory:  Negative for shortness of breath.   Cardiovascular:  Negative for chest pain and palpitations.  Gastrointestinal:  Negative for blood in stool, constipation and diarrhea.  Endocrine: Negative for increased urination.  Genitourinary:  Negative for involuntary urination.  Musculoskeletal:  Positive for morning stiffness. Negative for joint pain, gait problem, joint pain, joint swelling, myalgias, muscle weakness, muscle tenderness and myalgias.  Skin:  Positive for rash. Negative for color change, hair loss and  sensitivity to sunlight.  Allergic/Immunologic: Negative for susceptible to infections.  Neurological:  Negative for dizziness and headaches.  Hematological:  Negative for swollen glands.  Psychiatric/Behavioral:  Negative for depressed mood and sleep disturbance. The patient is nervous/anxious.     PMFS History:  Patient Active Problem List   Diagnosis Date Noted   Blurred vision, left eye 04/19/2021   Rash and other nonspecific skin eruption 10/18/2020   Pain in right ankle and joints of right foot 02/17/2020   Rheumatoid arthritis with  rheumatoid factor of multiple sites without organ or systems involvement ([place]) 12/30/2019   High risk medication use 12/30/2019   Right knee pain 12/03/2019   Lesion of nose 05/22/2019   Impaired fasting blood sugar 12/19/2018   Routine general medical examination at a health care facility 11/15/2015   Essential hypertension 03/22/2015    Past Medical History:  Diagnosis Date   Hypertension    Poison ivy dermatitis 07/19/2021   Rheumatoid arthritis ([place])     Family History  Problem Relation Age of Onset   Diabetes Mother    Diabetes Sister    Breast cancer Neg Hx    Past Surgical History:  Procedure Laterality Date   KNEE SURGERY Bilateral 2004,2009   Social History   Social History Narrative   Not on file   Immunization History  Administered Date(s) Administered   Fluad Quad(high Dose 65+) 12/19/2018, 12/04/2019   Influenza, High Dose Seasonal PF 12/10/2016, 12/13/2017   Influenza,inj,Quad PF,6+ Mos 11/15/2015   PFIZER(Purple Top)SARS-COV-2 Vaccination 03/31/2019, 04/18/2019   Pneumococcal Conjugate-13 03/22/2015   Pneumococcal Polysaccharide-23 03/10/2014   Tdap 09/09/2011   Zoster Recombinat (Shingrix) 10/04/2017   Zoster, Live 12/24/2013     Objective: Vital Signs: BP (!) 157/86 (BP Location: Left Arm, Patient Position: Sitting, Cuff Size: Large)   Pulse 69   Resp 14   Ht 5' 2.5" (1.588 m)   Wt 160 lb 12.8 oz (72.9 kg)   BMI 28.94 kg/m    Physical Exam Cardiovascular:     Rate and Rhythm: Normal rate and regular rhythm.  Pulmonary:     Effort: Pulmonary effort is normal.     Breath sounds: Normal breath sounds.  Musculoskeletal:     Right lower leg: No edema.     Left lower leg: No edema.  Skin:    General: Skin is warm and dry.     Comments: Four 2-30m diameter raised papules on left upper arm, no surrounding erythema or induration  Neurological:     Mental Status: She is alert.  Psychiatric:        Mood and Affect: Mood normal.       Musculoskeletal Exam:  Shoulders full ROM no tenderness or swelling Elbows full ROM no tenderness or swelling Wrists full ROM no tenderness or swelling Fingers full ROM no tenderness or swelling, chronic soft tissue thickening over MCP joints of both hands Knees full ROM no tenderness or swelling, bilateral patellofemoral crepitus Ankles full ROM no tenderness or swelling   CDAI Exam: CDAI Score: 0  Patient Global: 0 mm; Provider Global: 0 mm Swollen: 0 ; Tender: 0  Joint Exam 10/25/2021   All documented joints were normal     Investigation: No additional findings.  Imaging: No results found.  Recent Labs: Lab Results  Component Value Date   WBC 4.4 07/19/2021   HGB 12.5 07/19/2021   PLT 258 07/19/2021   NA 140 07/19/2021   K 3.9 07/19/2021   CL 105 07/19/2021  CO2 26 07/19/2021   GLUCOSE 83 07/19/2021   BUN 14 07/19/2021   CREATININE 0.79 07/19/2021   BILITOT 0.4 07/19/2021   ALKPHOS 65 12/21/2019   AST 21 07/19/2021   ALT 18 07/19/2021   PROT 6.7 07/19/2021   ALBUMIN 4.5 12/21/2019   CALCIUM 9.7 07/19/2021   GFRAA 93 06/22/2020   QFTBGOLDPLUS NEGATIVE 12/30/2019    Speciality Comments: No specialty comments available.  Procedures:  No procedures performed Allergies: Patient has no known allergies.   Assessment / Plan:     Visit Diagnoses: Rheumatoid arthritis with rheumatoid factor of multiple sites without organ or systems involvement ([place]) - Plan: Sedimentation rate  Appears to be in remission or low disease activity clinically on MTX 20 mg weekly. Checking ESR for disease activity monitoring. She is interested in getting off of methotrexate again I think this is reasonable to try but may see relapse of symptoms so recommended she contact us and may need to restart if symptoms return like last year.  High risk medication use - Methotrexate 20 mg (8 tabs of 2.5 mg) p.o. weekly and folic acid 1 mg daily  - Plan: CBC with Differential/Platelet,  COMPLETE METABOLIC PANEL WITH GFR  Checking CBC and CMP for methotrexate toxicity monitoring. No significant infections, not sure if skin rashes are related.  Poison ivy dermatitis  Localized skin rashes are resolved and current/recent rashes do not appear like contact dermatitis.  Rash and other nonspecific skin eruption  Questionably related to methotrexate based on symptom timings but not a typical drug eruption appearance. Recommending trial of interrupting treatment, if no improvement can consider dermatology referral.   Orders: Orders Placed This Encounter  Procedures   Sedimentation rate   CBC with Differential/Platelet   COMPLETE METABOLIC PANEL WITH GFR   No orders of the defined types were placed in this encounter.    Follow-Up Instructions: Return in about 1 year (around 10/26/2022), or if symptoms worsen or fail to improve, for RA d/c treatment f/u PRN.   Collier Salina, MD  Note - This record has been created using [employer].  Chart creation errors have been sought, but may not always  have been located. Such creation errors do not reflect on  the standard of medical care.

## 2021-10-20 DIAGNOSIS — Z79631 Long term (current) use of antimetabolite agent: Secondary | ICD-10-CM | POA: Diagnosis not present

## 2021-10-20 DIAGNOSIS — Z833 Family history of diabetes mellitus: Secondary | ICD-10-CM | POA: Diagnosis not present

## 2021-10-20 DIAGNOSIS — I10 Essential (primary) hypertension: Secondary | ICD-10-CM | POA: Diagnosis not present

## 2021-10-20 DIAGNOSIS — Z008 Encounter for other general examination: Secondary | ICD-10-CM | POA: Diagnosis not present

## 2021-10-20 DIAGNOSIS — M069 Rheumatoid arthritis, unspecified: Secondary | ICD-10-CM | POA: Diagnosis not present

## 2021-10-25 ENCOUNTER — Encounter: Payer: Self-pay | Admitting: Internal Medicine

## 2021-10-25 ENCOUNTER — Ambulatory Visit: Payer: Medicare HMO | Attending: Internal Medicine | Admitting: Internal Medicine

## 2021-10-25 VITALS — BP 157/86 | HR 69 | Resp 14 | Ht 62.5 in | Wt 160.8 lb

## 2021-10-25 DIAGNOSIS — R21 Rash and other nonspecific skin eruption: Secondary | ICD-10-CM

## 2021-10-25 DIAGNOSIS — H538 Other visual disturbances: Secondary | ICD-10-CM | POA: Diagnosis not present

## 2021-10-25 DIAGNOSIS — Z79899 Other long term (current) drug therapy: Secondary | ICD-10-CM

## 2021-10-25 DIAGNOSIS — L237 Allergic contact dermatitis due to plants, except food: Secondary | ICD-10-CM | POA: Diagnosis not present

## 2021-10-25 DIAGNOSIS — M0579 Rheumatoid arthritis with rheumatoid factor of multiple sites without organ or systems involvement: Secondary | ICD-10-CM | POA: Diagnosis not present

## 2021-10-25 NOTE — Patient Instructions (Signed)
You can discontinue taking the methotrexate at this time and I recommend continuing folic acid for another 2 weeks after you finish that.  I would expect the skin rashes to improve within a month after stopping the methotrexate if they are related to your medication.  If you start to see any increase in the joint pain or swelling symptoms let us know and we can follow-up as needed at that time.

## 2021-10-26 LAB — COMPLETE METABOLIC PANEL WITH GFR
AG Ratio: 2 (calc) (ref 1.0–2.5)
ALT: 15 U/L (ref 6–29)
AST: 17 U/L (ref 10–35)
Albumin: 4.4 g/dL (ref 3.6–5.1)
Alkaline phosphatase (APISO): 68 U/L (ref 37–153)
BUN: 16 mg/dL (ref 7–25)
CO2: 28 mmol/L (ref 20–32)
Calcium: 9.3 mg/dL (ref 8.6–10.4)
Chloride: 104 mmol/L (ref 98–110)
Creat: 0.84 mg/dL (ref 0.60–1.00)
Globulin: 2.2 g/dL (calc) (ref 1.9–3.7)
Glucose, Bld: 94 mg/dL (ref 65–99)
Potassium: 4.3 mmol/L (ref 3.5–5.3)
Sodium: 139 mmol/L (ref 135–146)
Total Bilirubin: 0.3 mg/dL (ref 0.2–1.2)
Total Protein: 6.6 g/dL (ref 6.1–8.1)
eGFR: 74 mL/min/{1.73_m2} (ref >=60)

## 2021-10-26 LAB — CBC WITH DIFFERENTIAL/PLATELET
Absolute Monocytes: 411 cells/uL (ref 200–950)
Basophils Absolute: 20 cells/uL (ref 0–200)
Basophils Relative: 0.6 %
Eosinophils Absolute: 252 cells/uL (ref 15–500)
Eosinophils Relative: 7.4 %
HCT: 36.5 % (ref 35.0–45.0)
Hemoglobin: 12.2 g/dL (ref 11.7–15.5)
Lymphs Abs: 1170 cells/uL (ref 850–3900)
MCH: 31.4 pg (ref 27.0–33.0)
MCHC: 33.4 g/dL (ref 32.0–36.0)
MCV: 94.1 fL (ref 80.0–100.0)
MPV: 9.8 fL (ref 7.5–12.5)
Monocytes Relative: 12.1 %
Neutro Abs: 1547 cells/uL (ref 1500–7800)
Neutrophils Relative %: 45.5 %
Platelets: 274 10*3/uL (ref 140–400)
RBC: 3.88 10*6/uL (ref 3.80–5.10)
RDW: 13.9 % (ref 11.0–15.0)
Total Lymphocyte: 34.4 %
WBC: 3.4 10*3/uL — ABNORMAL LOW (ref 3.8–10.8)

## 2021-10-26 LAB — SEDIMENTATION RATE: Sed Rate: 29 mm/h (ref 0–30)

## 2021-10-26 NOTE — Progress Notes (Signed)
Lab results look okay her white blood cell count is slightly decreased. If this is related to the methotrexate it should improve on its own anyways since we are stopping this for now.

## 2021-10-27 NOTE — Progress Notes (Signed)
I think it is still fine to try discontinuing the methotrexate she will just need to monitor and contact us if symptoms increase. Her sedimentation rate is only mildly high and might not be significant.

## 2021-11-24 ENCOUNTER — Telehealth: Payer: Medicare HMO

## 2021-11-30 NOTE — Progress Notes (Signed)
Office Visit Note  Patient: Raven Haynes             Date of Birth: 1951-03-11           MRN: [id number]             PCP: Hoyt Koch, MD Referring: Hoyt Koch, * Visit Date: 12/01/2021   Subjective:  Other (Flare up of hands, swelling and pain. BP going up and down. )   History of Present Illness: Raven Haynes is a 71 y.o. female here for follow up for seropositive RA. At our last visit we stopped methotrexate due to concern about contributing to skin rashes and these resolved within 2 weeks of stopping the medication. However within 3-4 weeks she quickly redeveloped joint pain, swelling, and stiffness in both hands and stiffness and difficulty raising both shoulders.  Previous HPI 10/25/21 Raven Haynes is a 71 y.o. female here for follow up for seropositive RA on methotrexate 20 mg p.o. weekly.  She feels her arthritis symptoms have been very well controlled.  She is concerned about the medicine since she is noticing breaking out with small mildly itchy raised red skin rashes on her left upper arm these appear to be worse for a few days with each dose of methotrexate that she takes.  She does not recall having any similar rashes in the past.  07/19/21 Raven Haynes is a 71 y.o. 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  She feels well without any flare up of arthritis symptoms. She developed skin rash recently along with her husband and seen in urgent care thought to be poison ivy exposure. She took a 6 day tapering course of prednisone with partial improvement. However still has some active red rashes a month later now. Including on her abdomen notices some extension or new spots spreading above the area.   Previous HPI 04/19/2021 Raven Haynes is a 71 y.o. 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 She feels joint symptoms are under control. She is staying very active chasing her grandchildren. She has  noticed some worsening left eye vision blurriness when wearing contact lenses, but not when wearing glasses. She denies any pain, redness, or swelling on or around the eye.   Previous HPI 01/18/21 Raven Haynes is a 71 y.o. 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 Last month she suffered fracture to her right 7th and 8th ribs.  Besides this no major worsening of joint pain or swelling. New rash on the face without significant symptoms. No oral ulcers or pain. There is been no recurrence of the previous arm and truncal rash.   Previous HPI   02/17/20 Raven Haynes is a 71 y.o. female here for follow of seropositive RA since starting methotrexate about 6 weeks ago and on prednisone '10mg'$  daily. She was also seen in the interval by Dr. Junius Roads for right foot injection. She did not have any significant benefit. She was then recommended to have MRI for further assessment but had trouble getting this with insurance declined. She tolerated decreasing prednisone to 5 mg daily without problems. Her hands feel fine but her foot pain is severely limiting weight bearing activities.   Review of Systems  Constitutional:  Positive for fatigue.  HENT:  Negative for mouth sores and mouth dryness.   Eyes:  Negative for dryness.  Respiratory:  Negative for shortness of  breath.   Cardiovascular:  Negative for chest pain and palpitations.  Gastrointestinal:  Negative for blood in stool, constipation and diarrhea.  Endocrine: Negative for increased urination.  Genitourinary:  Negative for involuntary urination.  Musculoskeletal:  Positive for joint pain, gait problem, joint pain, joint swelling and morning stiffness. Negative for myalgias, muscle weakness, muscle tenderness and myalgias.  Skin:  Negative for color change, rash, hair loss and sensitivity to sunlight.  Allergic/Immunologic: Negative for susceptible to infections.  Neurological:  Negative for dizziness and headaches.   Hematological:  Negative for swollen glands.  Psychiatric/Behavioral:  Positive for depressed mood. Negative for sleep disturbance. The patient is not nervous/anxious.     PMFS History:  Patient Active Problem List   Diagnosis Date Noted   Blurred vision, left eye 04/19/2021   Rash and other nonspecific skin eruption 10/18/2020   Pain in right ankle and joints of right foot 02/17/2020   Rheumatoid arthritis with rheumatoid factor of multiple sites without organ or systems involvement ([place]) 12/30/2019   High risk medication use 12/30/2019   Right knee pain 12/03/2019   Lesion of nose 05/22/2019   Impaired fasting blood sugar 12/19/2018   Routine general medical examination at a health care facility 11/15/2015   Essential hypertension 03/22/2015    Past Medical History:  Diagnosis Date   Hypertension    Poison ivy dermatitis 07/19/2021   Rheumatoid arthritis ([place])     Family History  Problem Relation Age of Onset   Diabetes Mother    Diabetes Sister    Breast cancer Neg Hx    Past Surgical History:  Procedure Laterality Date   KNEE SURGERY Bilateral 2004,2009   Social History   Social History Narrative   Not on file   Immunization History  Administered Date(s) Administered   Fluad Quad(high Dose 65+) 12/19/2018, 12/04/2019   Influenza, High Dose Seasonal PF 12/10/2016, 12/13/2017   Influenza,inj,Quad PF,6+ Mos 11/15/2015   PFIZER(Purple Top)SARS-COV-2 Vaccination 03/31/2019, 04/18/2019   Pneumococcal Conjugate-13 03/22/2015   Pneumococcal Polysaccharide-23 03/10/2014   Tdap 09/09/2011   Zoster Recombinat (Shingrix) 10/04/2017   Zoster, Live 12/24/2013     Objective: Vital Signs: BP (!) 163/80 (BP Location: Left Arm, Patient Position: Sitting, Cuff Size: Normal)   Pulse 60   Resp 15   Ht '5\' 2"'$  (1.575 m)   Wt 162 lb (73.5 kg)   BMI 29.63 kg/m    Physical Exam Cardiovascular:     Rate and Rhythm: Normal rate and regular rhythm.  Pulmonary:     Effort:  Pulmonary effort is normal.     Breath sounds: Normal breath sounds.  Skin:    General: Skin is warm and dry.  Neurological:     Mental Status: She is alert.  Psychiatric:        Mood and Affect: Mood normal.      Musculoskeletal Exam:  Shoulders pain with elevation above horizontal, right shoulder pain provoked with internal rotation while at 90 degrees, lateral tenderness to pressure no palpable swelling Elbows full ROM no tenderness or swelling Wrists full ROM no tenderness or swelling Bilateral wrist swelling more tenderness on right than left Diffuse swelling across MCP joints both hands tenderness throughout left hand MCPs and second and third PIP right hand tenderness throughout second and third fingers Knees full ROM no tenderness or swelling   CDAI Exam: CDAI Score: 37  Patient Global: 70 mm; Provider Global: 50 mm Swollen: 13 ; Tender: 12  Joint Exam 12/01/2021  Right  Left  Glenohumeral   Tender   Tender  Wrist  Swollen Tender  Swollen   MCP 2  Swollen Tender  Swollen Tender  MCP 3  Swollen Tender  Swollen Tender  MCP 4  Swollen   Swollen Tender  MCP 5  Swollen      PIP 2  Swollen Tender  Swollen Tender  PIP 3  Swollen Tender  Swollen Tender     Investigation: No additional findings.  Imaging: No results found.  Recent Labs: Lab Results  Component Value Date   WBC 3.4 (L) 10/25/2021   HGB 12.2 10/25/2021   PLT 274 10/25/2021   NA 139 10/25/2021   K 4.3 10/25/2021   CL 104 10/25/2021   CO2 28 10/25/2021   GLUCOSE 94 10/25/2021   BUN 16 10/25/2021   CREATININE 0.84 10/25/2021   BILITOT 0.3 10/25/2021   ALKPHOS 65 12/21/2019   AST 17 10/25/2021   ALT 15 10/25/2021   PROT 6.6 10/25/2021   ALBUMIN 4.5 12/21/2019   CALCIUM 9.3 10/25/2021   GFRAA 93 06/22/2020   QFTBGOLDPLUS NEGATIVE 12/30/2019    Speciality Comments: No specialty comments available.  Procedures:  No procedures performed Allergies: Patient has no known allergies.    Assessment / Plan:     Visit Diagnoses: Rheumatoid arthritis with rheumatoid factor of multiple sites without organ or systems involvement ([place]) - Plan: leflunomide (ARAVA) 20 MG tablet, predniSONE (DELTASONE) 5 MG tablet  High disease activity quickly returned after stopping methotrexate.  This is twice now with stopping her DMARD treatment joint inflammation has returned pretty quickly.  Discussed treatment options I do not think hydroxychloroquine is adequate given associated chronic joint changes and disease severity.  She would like to stick with oral medications or less aggressive treatment if possible so not looking at biologic DMARDs right now.  We will start leflunomide 20 mg p.o. daily since methotrexate was highly effective but causing skin side effects.  12-day prednisone taper starting at 20 mg today for acute flare.  Rash and other nonspecific skin eruption  Skin rashes resolved after discontinuing the methotrexate so definitely suspect medication related side effect.  We will switch to alternate treatment option I do not think she needs any additional allergy or dermatology investigation at this time.  High risk medication use  Discussed risks of leflunomide medication including GI intolerance, need for lab monitoring of cytopenia or hepatotoxicity, risk of increased blood pressure or decreased GI absorption.  Also prescribing short-term prednisone for the acute flare may elevate blood sugars.  Orders: No orders of the defined types were placed in this encounter.  Meds ordered this encounter  Medications   leflunomide (ARAVA) 20 MG tablet    Sig: Take 1 tablet (20 mg total) by mouth daily.    Dispense:  30 tablet    Refill:  1   predniSONE (DELTASONE) 5 MG tablet    Sig: Take 4 tablets (20 mg total) by mouth daily with breakfast for 3 days, THEN 3 tablets (15 mg total) daily with breakfast for 3 days, THEN 2 tablets (10 mg total) daily with breakfast for 3 days, THEN 1  tablet (5 mg total) daily with breakfast for 3 days.    Dispense:  30 tablet    Refill:  0   Greater than 30 minutes was spent today on this encounter including history and exam assessment of acute disease flareup, and detailed counseling of multiple treatment options and the discussion of risks and monitoring  reqiurement for new high risk medication start.   Follow-Up Instructions: Return in about 5 weeks (around 01/05/2022) for RA new LEF start f/u 4-6wks.   Collier Salina, MD  Note - This record has been created using [employer].  Chart creation errors have been sought, but may not always  have been located. Such creation errors do not reflect on  the standard of medical care.

## 2021-12-01 ENCOUNTER — Ambulatory Visit: Payer: Medicare HMO | Attending: Internal Medicine | Admitting: Internal Medicine

## 2021-12-01 ENCOUNTER — Encounter: Payer: Self-pay | Admitting: Internal Medicine

## 2021-12-01 VITALS — BP 163/80 | HR 60 | Resp 15 | Ht 62.0 in | Wt 162.0 lb

## 2021-12-01 DIAGNOSIS — R21 Rash and other nonspecific skin eruption: Secondary | ICD-10-CM | POA: Diagnosis not present

## 2021-12-01 DIAGNOSIS — M0579 Rheumatoid arthritis with rheumatoid factor of multiple sites without organ or systems involvement: Secondary | ICD-10-CM | POA: Diagnosis not present

## 2021-12-01 DIAGNOSIS — Z79899 Other long term (current) drug therapy: Secondary | ICD-10-CM

## 2021-12-01 MED ORDER — PREDNISONE 5 MG PO TABS: ORAL_TABLET | ORAL | 0 refills | Status: AC

## 2021-12-01 MED ORDER — LEFLUNOMIDE 20 MG PO TABS: 20.0000 mg | ORAL_TABLET | Freq: Every day | ORAL | 1 refills | Status: DC

## 2021-12-16 IMAGING — CR DG CHEST 2V
2 series · 2 of 2 positions shown · non-contrast
Comparison: None.

CLINICAL DATA: High blood pressure.  Wrist pain.

EXAM:
CHEST - 2 VIEW

[chest pa]
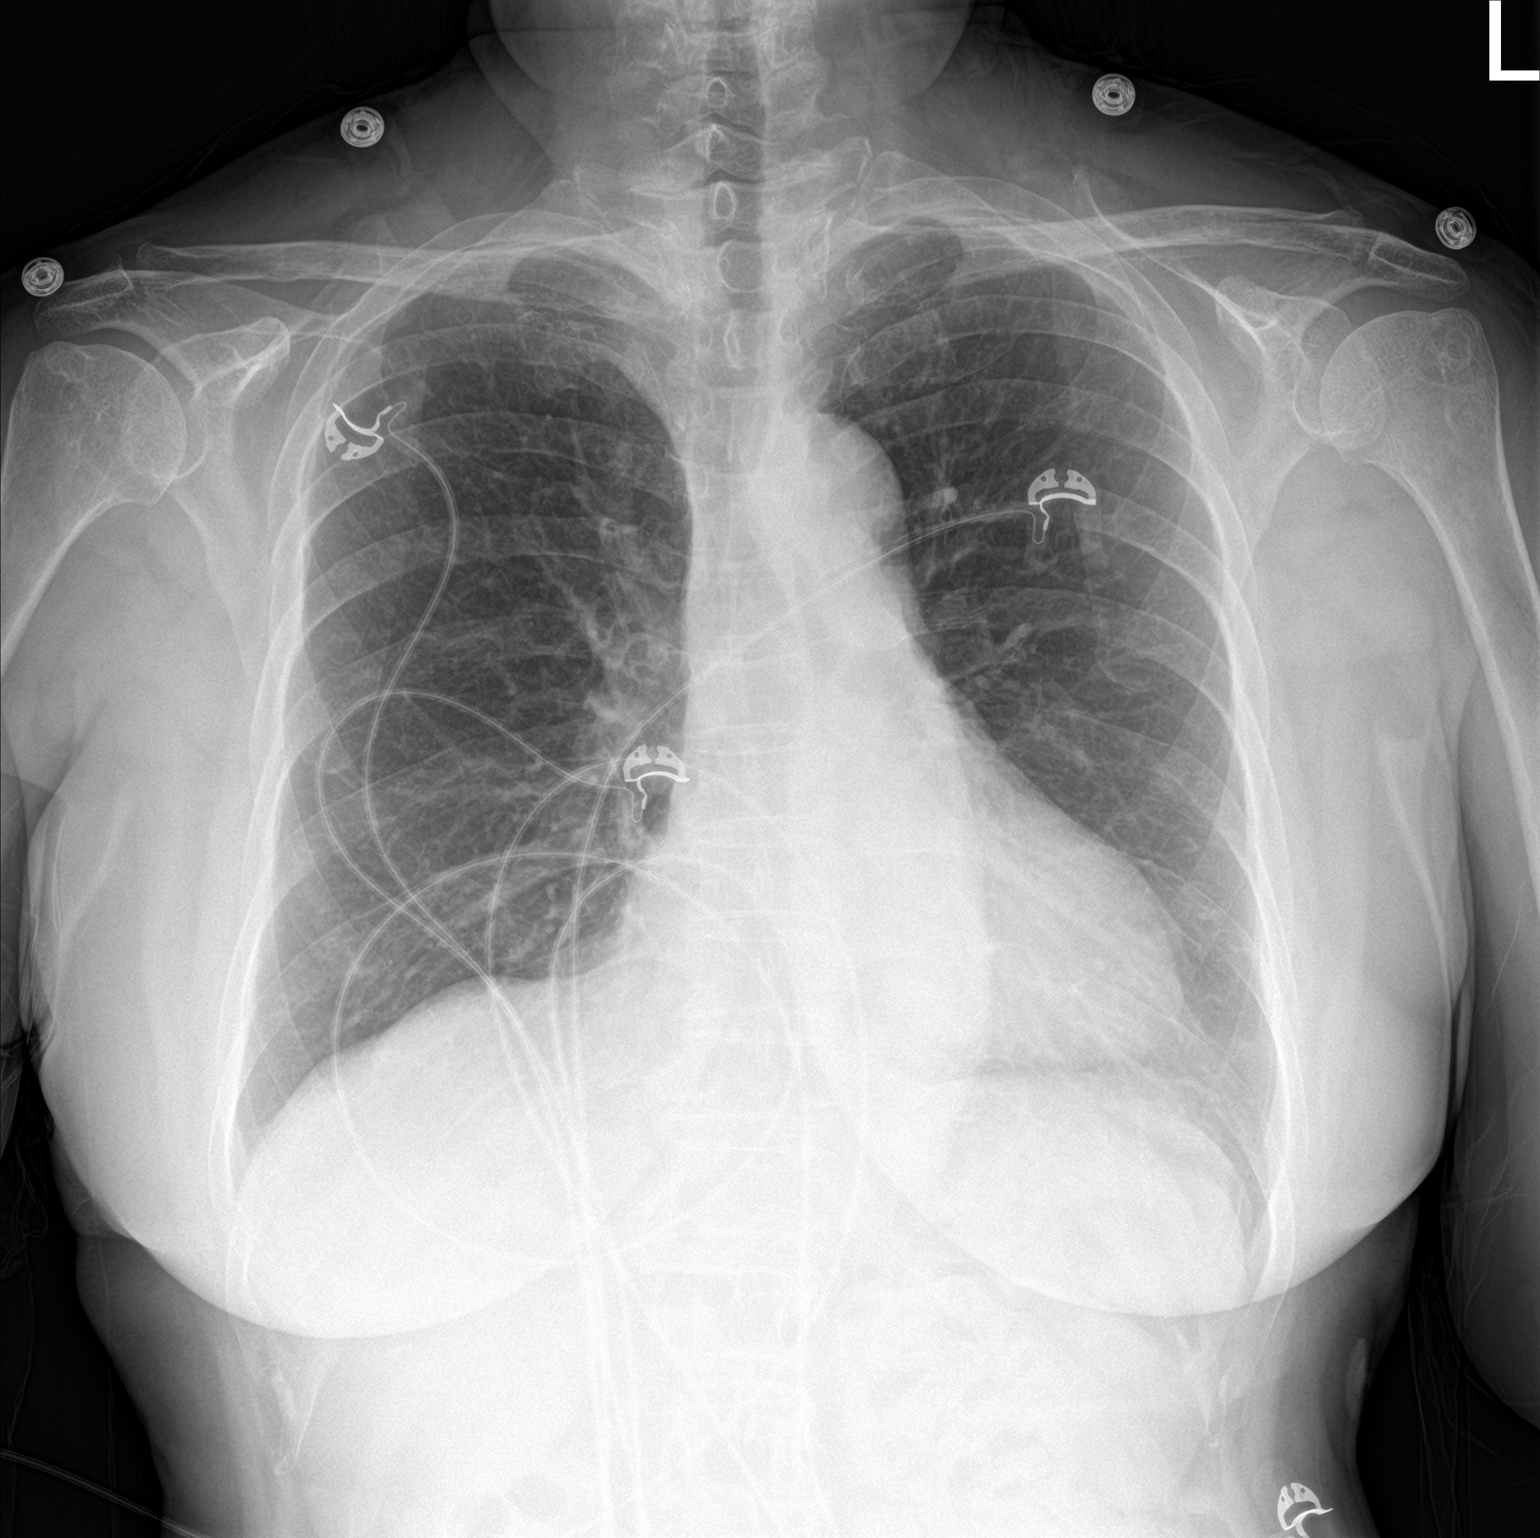

[chest lat]
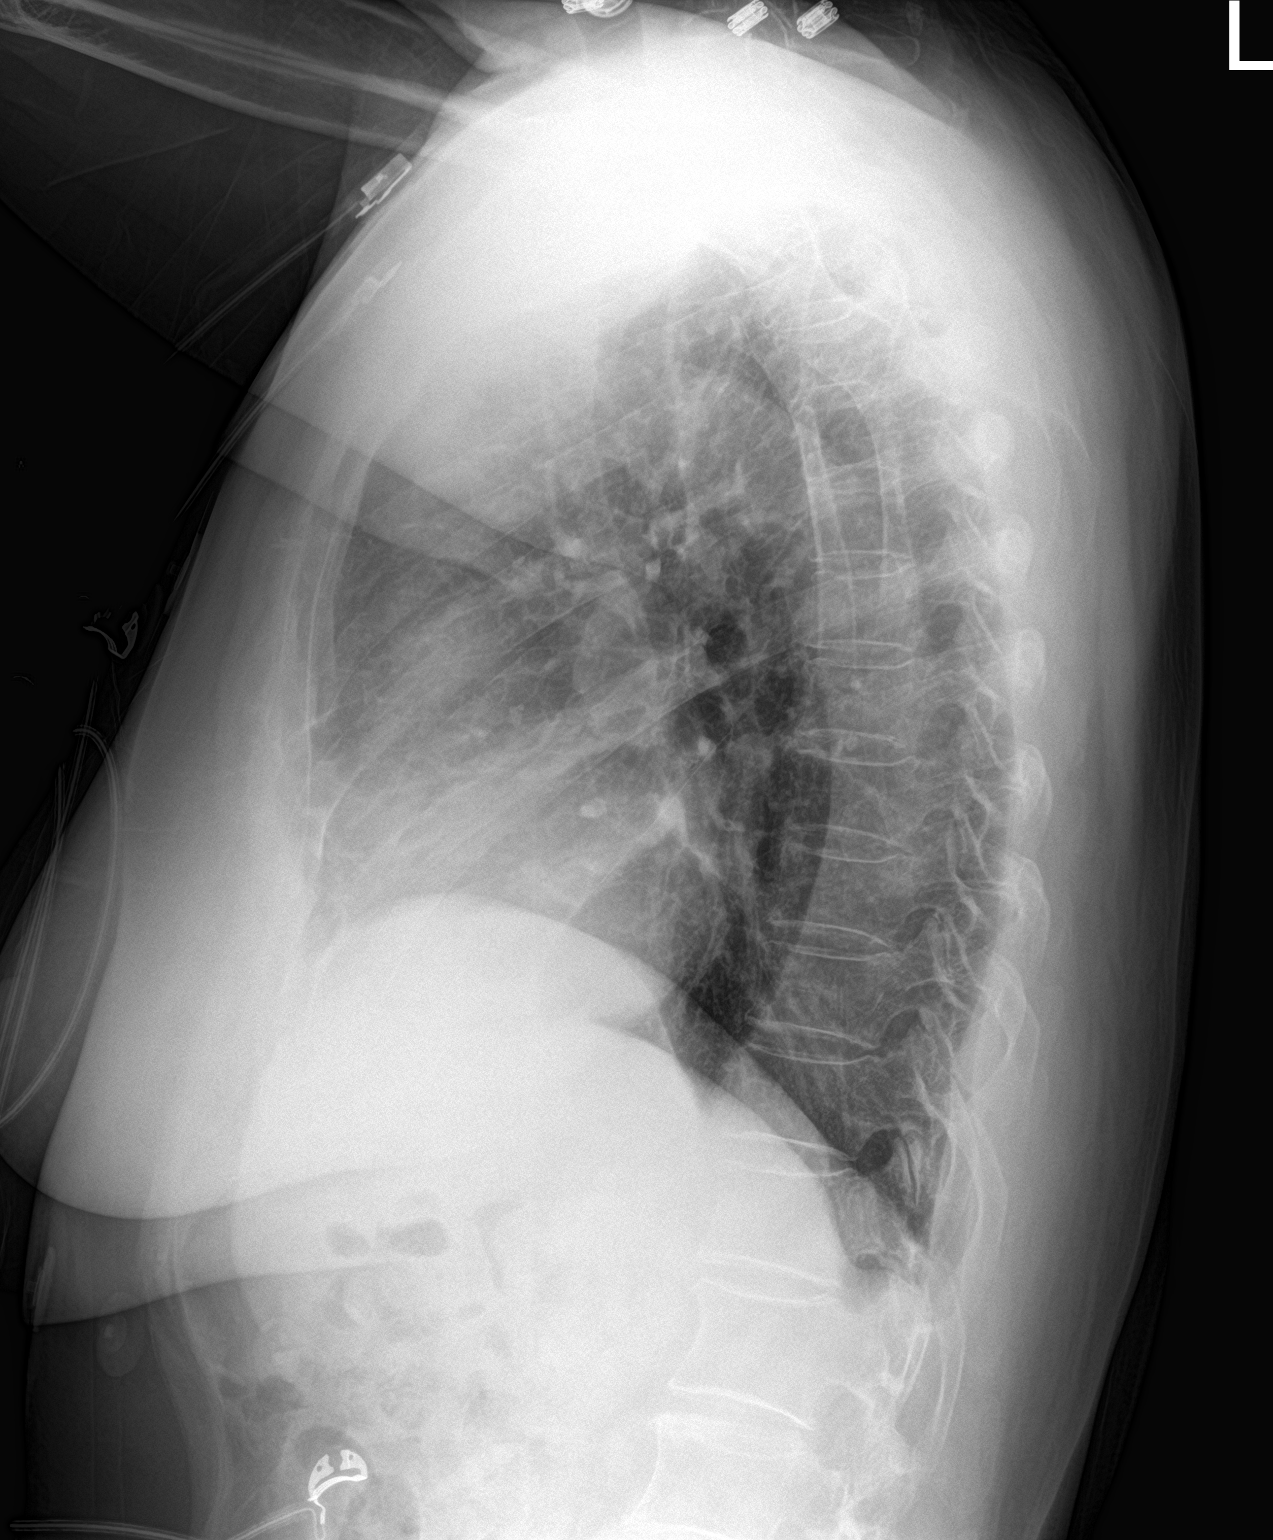

[2 of 2 positions shown; findings below may reference images not displayed]

FINDINGS: Artifact from EKG leads.

Normal heart size. Mild aortic tortuosity. No acute infiltrate or
edema. No effusion or pneumothorax. No acute osseous findings.
IMPRESSION: No evidence of acute disease.

## 2021-12-16 IMAGING — CR DG WRIST COMPLETE 3+V*R*
4 series · 4 of 4 positions shown · non-contrast
Comparison: None.

CLINICAL DATA: Right wrist pain after receiving flu shot.

EXAM:
RIGHT WRIST - COMPLETE 3+ VIEW

[wrist pa]
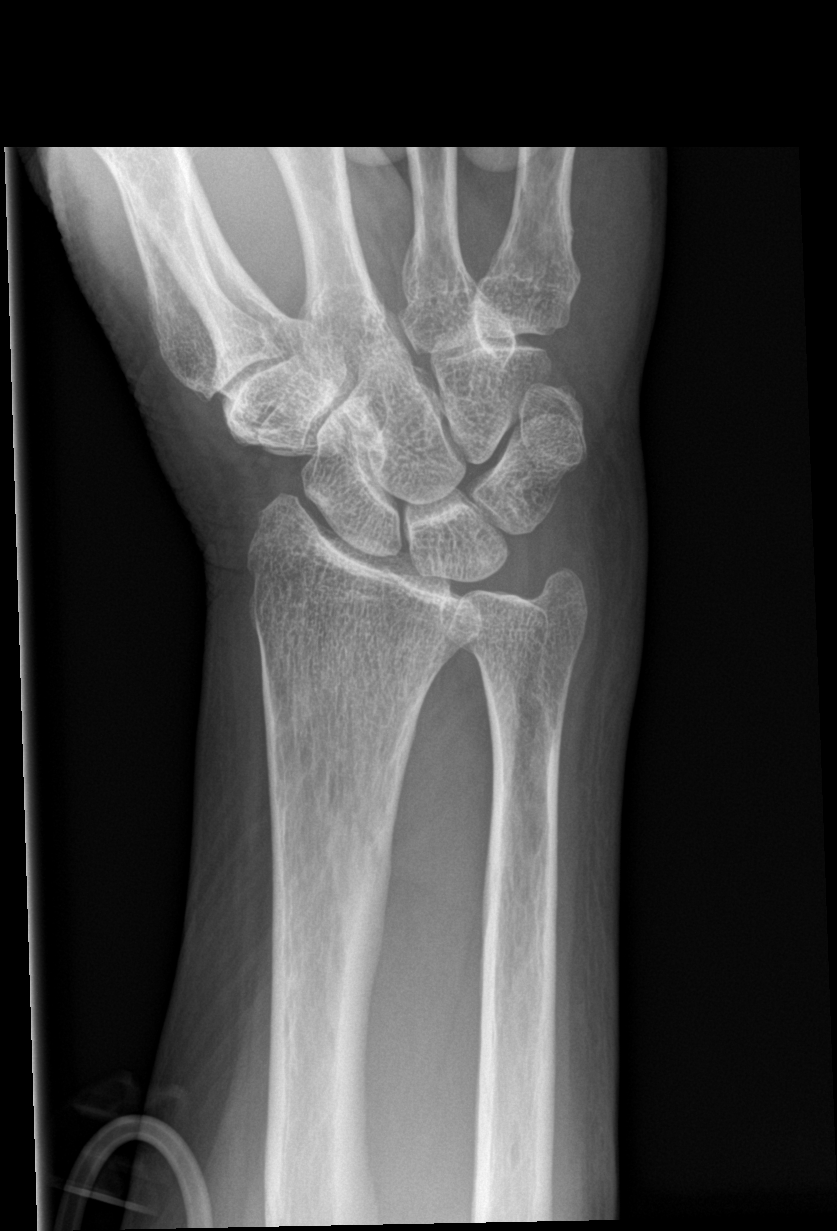

[wrist obl]
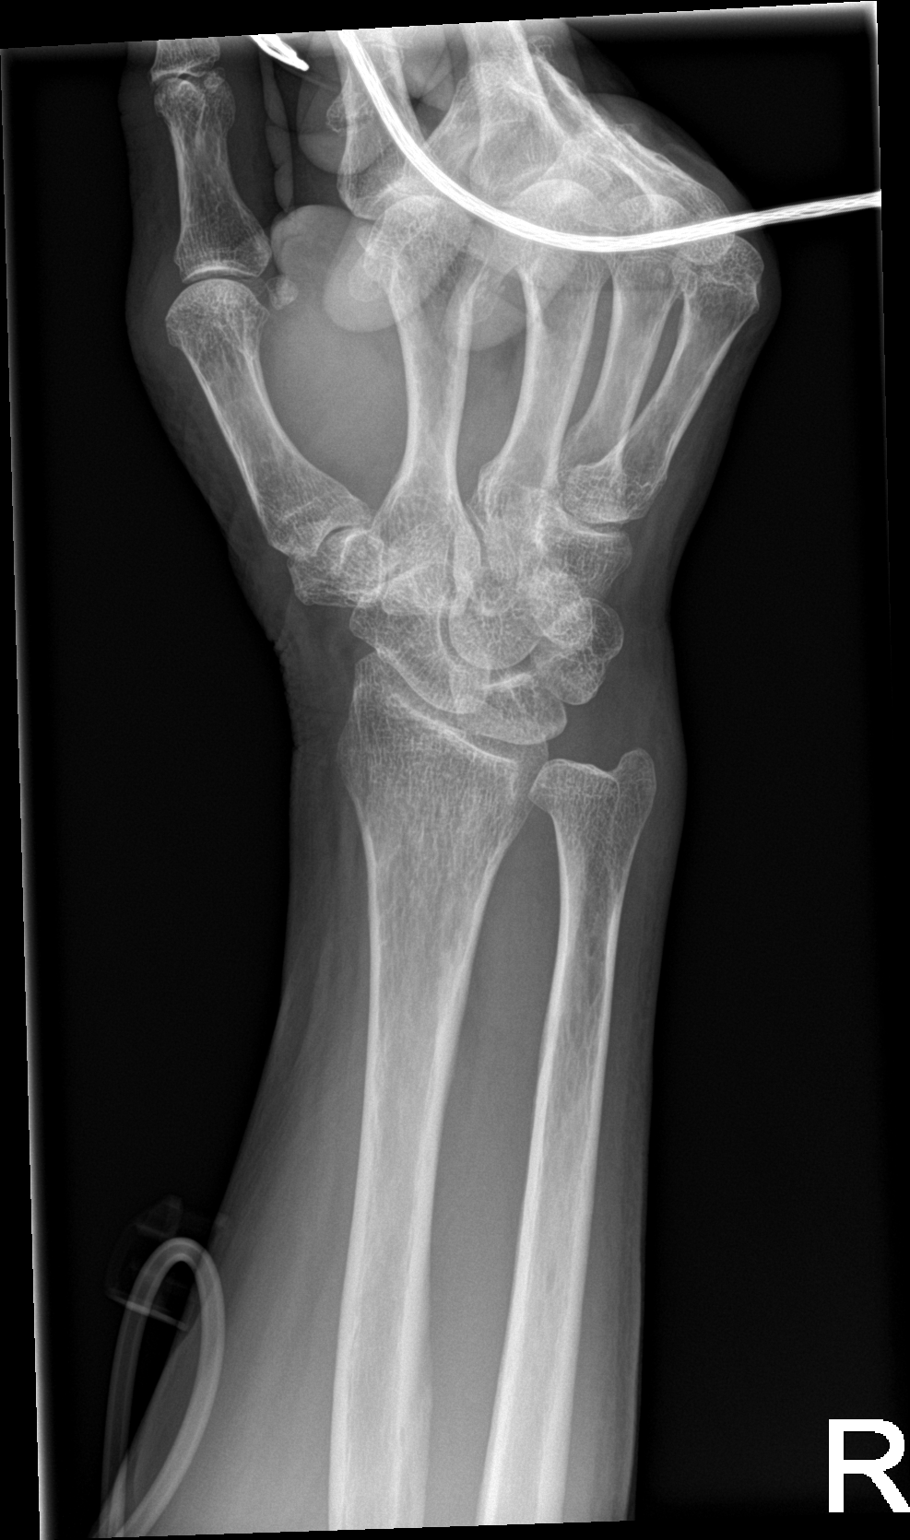

[wrist lat]
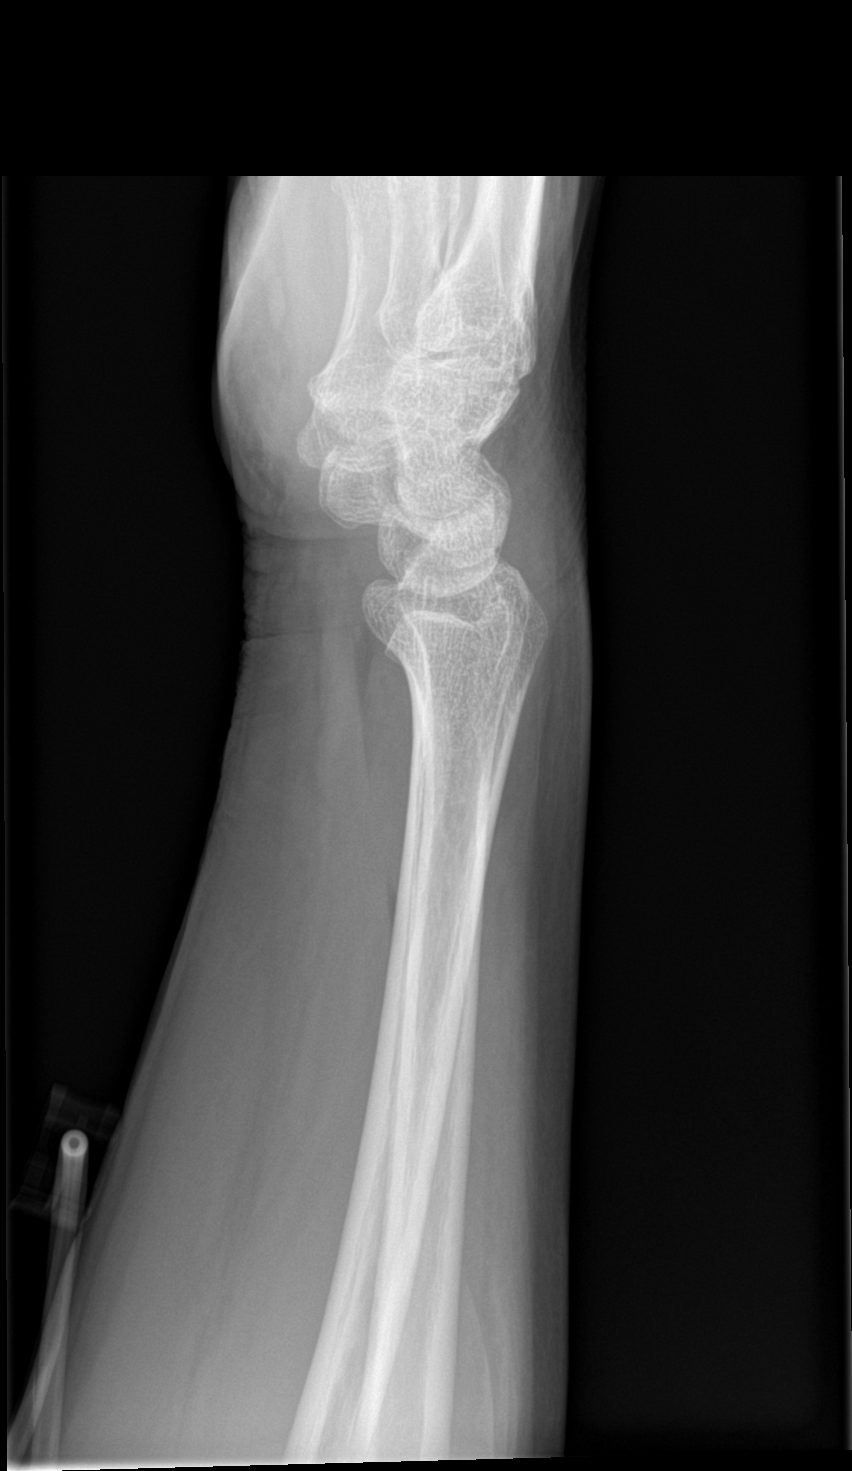

[wrist navicular]
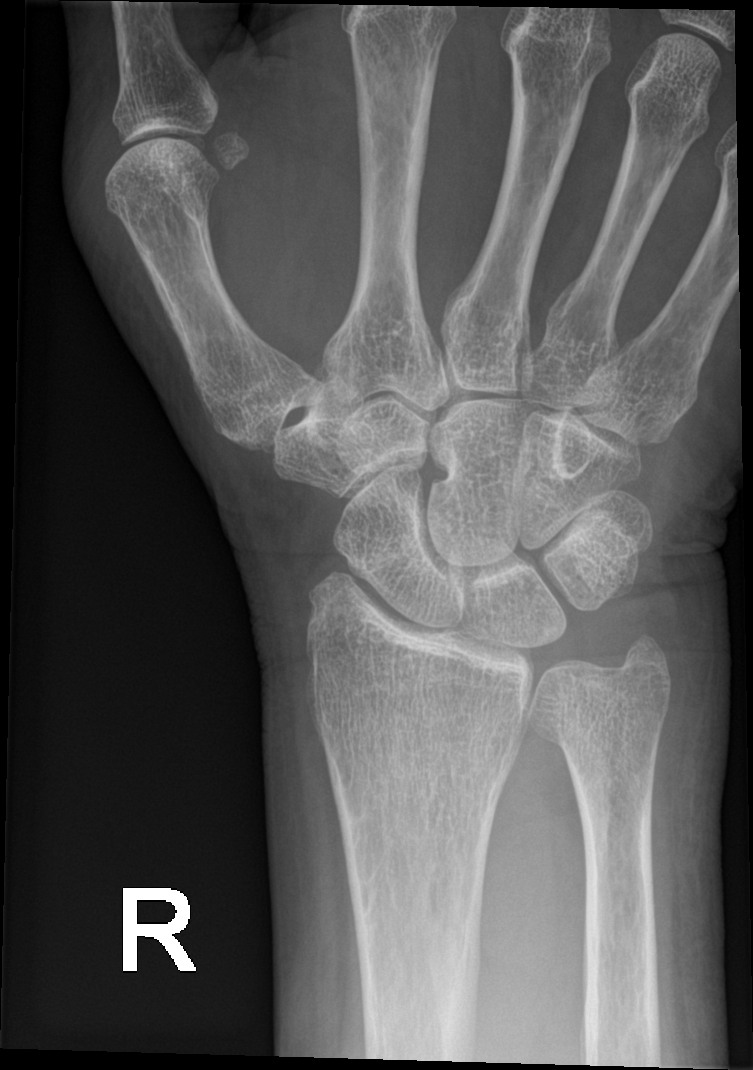

[4 of 4 positions shown; findings below may reference images not displayed]

FINDINGS: Medial soft tissue swelling suspected. No fracture, erosion, or
subluxation. Generalized osteopenia.
IMPRESSION: Soft tissue swelling without underlying acute or erosive osseous
finding.

## 2021-12-22 ENCOUNTER — Other Ambulatory Visit: Payer: Self-pay | Admitting: Internal Medicine

## 2022-01-01 NOTE — Progress Notes (Signed)
Office Visit Note  Patient: Raven Haynes             Date of Birth: Apr 19, 1950           MRN: [id number]             PCP: Hoyt Koch, MD Referring: Hoyt Koch, * Visit Date: 01/08/2022   Subjective:  Follow-up (Stiffness in the morning, especially hands and knees. Left wrist swelling and pain.)   History of Present Illness: Raven Haynes is a 71 y.o. 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 She is still having significant joint pain, stiffness, and swelling in hands and wrists and pain in knees. Left wrist has a new nodule as well that is swelling up and tender. Morning stiffness about 1-2 hours. She had one episode of severe diarrhea and abdominal pain after starting the medicine but otherwise fine.  Previous HPI 12/01/2021 Raven Haynes is a 71 y.o. female here for follow up for seropositive RA. At our last visit we stopped methotrexate due to concern about contributing to skin rashes and these resolved within 2 weeks of stopping the medication. However within 3-4 weeks she quickly redeveloped joint pain, swelling, and stiffness in both hands and stiffness and difficulty raising both shoulders.   Previous HPI 10/25/21 Raven Haynes is a 71 y.o. female here for follow up for seropositive RA on methotrexate 20 mg p.o. weekly.  She feels her arthritis symptoms have been very well controlled.  She is concerned about the medicine since she is noticing breaking out with small mildly itchy raised red skin rashes on her left upper arm these appear to be worse for a few days with each dose of methotrexate that she takes.  She does not recall having any similar rashes in the past.   07/19/21 Raven Haynes is a 71 y.o. 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  She feels well without any flare up of arthritis symptoms. She developed skin rash recently along with her husband and seen in urgent care thought to be poison  ivy exposure. She took a 6 day tapering course of prednisone with partial improvement. However still has some active red rashes a month later now. Including on her abdomen notices some extension or new spots spreading above the area.   Previous HPI 04/19/2021 Raven Haynes is a 71 y.o. 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 She feels joint symptoms are under control. She is staying very active chasing her grandchildren. She has noticed some worsening left eye vision blurriness when wearing contact lenses, but not when wearing glasses. She denies any pain, redness, or swelling on or around the eye.   Previous HPI 01/18/21 Raven Haynes is a 71 y.o. 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 Last month she suffered fracture to her right 7th and 8th ribs.  Besides this no major worsening of joint pain or swelling. New rash on the face without significant symptoms. No oral ulcers or pain. There is been no recurrence of the previous arm and truncal rash.   Previous HPI   02/17/20 Raven Haynes is a 71 y.o. female here for follow of seropositive RA since starting methotrexate about 6 weeks ago and on prednisone '10mg'$  daily. She was also seen in the interval by Dr. Junius Roads for right foot injection. She did not have any  significant benefit. She was then recommended to have MRI for further assessment but had trouble getting this with insurance declined. She tolerated decreasing prednisone to 5 mg daily without problems. Her hands feel fine but her foot pain is severely limiting weight bearing activities.   Review of Systems  Constitutional:  Negative for fatigue.  HENT:  Negative for mouth sores and mouth dryness.   Eyes:  Positive for dryness.  Respiratory:  Negative for shortness of breath.   Cardiovascular:  Negative for chest pain and palpitations.  Gastrointestinal:  Positive for diarrhea. Negative for blood in stool and constipation.   Endocrine: Negative for increased urination.  Genitourinary:  Negative for involuntary urination.  Musculoskeletal:  Positive for joint pain, joint pain, joint swelling and morning stiffness. Negative for gait problem, myalgias, muscle weakness, muscle tenderness and myalgias.  Skin:  Positive for color change. Negative for rash, hair loss and sensitivity to sunlight.  Allergic/Immunologic: Negative for susceptible to infections.  Neurological:  Negative for dizziness and headaches.  Hematological:  Negative for swollen glands.  Psychiatric/Behavioral:  Negative for depressed mood and sleep disturbance. The patient is not nervous/anxious.     PMFS History:  Patient Active Problem List   Diagnosis Date Noted   Blurred vision, left eye 04/19/2021   Rash and other nonspecific skin eruption 10/18/2020   Pain in right ankle and joints of right foot 02/17/2020   Rheumatoid arthritis with rheumatoid factor of multiple sites without organ or systems involvement ([place]) 12/30/2019   High risk medication use 12/30/2019   Right knee pain 12/03/2019   Lesion of nose 05/22/2019   Impaired fasting blood sugar 12/19/2018   Routine general medical examination at a health care facility 11/15/2015   Essential hypertension 03/22/2015    Past Medical History:  Diagnosis Date   Hypertension    Poison ivy dermatitis 07/19/2021   Rheumatoid arthritis ([place])     Family History  Problem Relation Age of Onset   Diabetes Mother    Diabetes Sister    Breast cancer Neg Hx    Past Surgical History:  Procedure Laterality Date   KNEE SURGERY Bilateral 2004,2009   Social History   Social History Narrative   Not on file   Immunization History  Administered Date(s) Administered   Fluad Quad(high Dose 65+) 12/19/2018, 12/04/2019   Influenza, High Dose Seasonal PF 12/10/2016, 12/13/2017   Influenza,inj,Quad PF,6+ Mos 11/15/2015   PFIZER(Purple Top)SARS-COV-2 Vaccination 03/31/2019, 04/18/2019    Pneumococcal Conjugate-13 03/22/2015   Pneumococcal Polysaccharide-23 03/10/2014   Tdap 09/09/2011   Zoster Recombinat (Shingrix) 10/04/2017   Zoster, Live 12/24/2013     Objective: Vital Signs: BP (!) 176/88 (BP Location: Left Arm, Patient Position: Sitting, Cuff Size: Normal)   Pulse 61   Resp 15   Ht '5\' 2"'$  (1.575 m)   Wt 162 lb 3.2 oz (73.6 kg)   BMI 29.67 kg/m    Physical Exam Eyes:     Conjunctiva/sclera: Conjunctivae normal.  Cardiovascular:     Rate and Rhythm: Normal rate and regular rhythm.  Pulmonary:     Effort: Pulmonary effort is normal.     Breath sounds: Normal breath sounds.  Musculoskeletal:     Right lower leg: No edema.     Left lower leg: No edema.  Skin:    General: Skin is warm and dry.     Findings: No rash.  Neurological:     Mental Status: She is alert.  Psychiatric:        Mood  and Affect: Mood normal.      Musculoskeletal Exam:  Shoulders full ROM no tenderness or swelling Elbows full ROM no tenderness or swelling Left wrist swelling with ganglion cyst on radial side Fingers swelling in 2nd-3rd MCPs b/l with tenderness on left side Knees full ROM no tenderness or swelling, bilateral patellofemoral crepitus Ankles full ROM no tenderness or swelling   CDAI Exam: CDAI Score: 18  Patient Global: 40 mm; Provider Global: 40 mm Swollen: 5 ; Tender: 5  Joint Exam 01/08/2022      Right  Left  Wrist     Swollen Tender  MCP 2  Swollen   Swollen Tender  MCP 3  Swollen   Swollen Tender  Knee   Tender   Tender     Investigation: No additional findings.  Imaging: No results found.  Recent Labs: Lab Results  Component Value Date   WBC 3.4 (L) 10/25/2021   HGB 12.2 10/25/2021   PLT 274 10/25/2021   NA 139 10/25/2021   K 4.3 10/25/2021   CL 104 10/25/2021   CO2 28 10/25/2021   GLUCOSE 94 10/25/2021   BUN 16 10/25/2021   CREATININE 0.84 10/25/2021   BILITOT 0.3 10/25/2021   ALKPHOS 65 12/21/2019   AST 17 10/25/2021   ALT 15  10/25/2021   PROT 6.6 10/25/2021   ALBUMIN 4.5 12/21/2019   CALCIUM 9.3 10/25/2021   GFRAA 93 06/22/2020   QFTBGOLDPLUS NEGATIVE 12/30/2019    Speciality Comments: No specialty comments available.  Procedures:  No procedures performed Allergies: Patient has no known allergies.   Assessment / Plan:     Visit Diagnoses: Rheumatoid arthritis with rheumatoid factor of multiple sites without organ or systems involvement ([place]) - Plan: C-reactive protein  Does not appear to have a great improvement so far so starting leflunomide but has not been on this for adequate duration.  Checking CRP for disease activity monitoring.  Will plan to continue the leflunomide 20 mg p.o. daily.  If symptoms are not significantly further improved within 2 months will recommend switching to biologic DMARD.  High risk medication use - leflunomide 20 mg p.o. daily  - Plan: CBC with Differential/Platelet, COMPLETE METABOLIC PANEL WITH GFR  Checking CBC and CMP for leflunomide medication monitoring.  Only 1 episode of noticeable GI effects otherwise tolerating okay.  Rash and other nonspecific skin eruption  Skin rashes have resolved with discontinuation of the methotrexate.  Orders: Orders Placed This Encounter  Procedures   C-reactive protein   CBC with Differential/Platelet   COMPLETE METABOLIC PANEL WITH GFR   No orders of the defined types were placed in this encounter.    Follow-Up Instructions: Return in about 2 months (around 03/10/2022) for RA on LEF f/u 63mo.   CCollier Salina MD  Note - This record has been created using [employer]  Chart creation errors have been sought, but may not always  have been located. Such creation errors do not reflect on  the standard of medical care.

## 2022-01-04 ENCOUNTER — Other Ambulatory Visit: Payer: Self-pay | Admitting: *Deleted

## 2022-01-04 DIAGNOSIS — M0579 Rheumatoid arthritis with rheumatoid factor of multiple sites without organ or systems involvement: Secondary | ICD-10-CM

## 2022-01-04 MED ORDER — LEFLUNOMIDE 20 MG PO TABS: 20.0000 mg | ORAL_TABLET | Freq: Every day | ORAL | 2 refills | Status: DC

## 2022-01-04 NOTE — Telephone Encounter (Signed)
Next Visit: 01/08/2022  Last Visit: 12/01/2021  Last Fill: 12/01/2021   DX: Rheumatoid arthritis with rheumatoid factor of multiple sites without organ or systems involvement   Current Dose per office note 12/01/2021: leflunomide 20 mg p.o. daily   Labs: 10/25/2021  Lab results look okay her white blood cell count is slightly decreased. If this is related to the methotrexate it should improve on its own anyways since we are stopping this for now. I think it is still fine to try discontinuing the methotrexate she will just need to monitor and contact us if symptoms increase. Her sedimentation rate is only mildly high and might not be significant  Okay to refill Arava?

## 2022-01-08 ENCOUNTER — Encounter: Payer: Self-pay | Admitting: Internal Medicine

## 2022-01-08 ENCOUNTER — Ambulatory Visit: Payer: Medicare HMO | Attending: Internal Medicine | Admitting: Internal Medicine

## 2022-01-08 VITALS — BP 176/88 | HR 61 | Resp 15 | Ht 62.0 in | Wt 162.2 lb

## 2022-01-08 DIAGNOSIS — R21 Rash and other nonspecific skin eruption: Secondary | ICD-10-CM

## 2022-01-08 DIAGNOSIS — M0579 Rheumatoid arthritis with rheumatoid factor of multiple sites without organ or systems involvement: Secondary | ICD-10-CM

## 2022-01-08 DIAGNOSIS — Z79899 Other long term (current) drug therapy: Secondary | ICD-10-CM

## 2022-01-09 LAB — CBC WITH DIFFERENTIAL/PLATELET
Absolute Monocytes: 323 cells/uL (ref 200–950)
Basophils Absolute: 30 cells/uL (ref 0–200)
Basophils Relative: 0.7 %
Eosinophils Absolute: 60 cells/uL (ref 15–500)
Eosinophils Relative: 1.4 %
HCT: 40.9 % (ref 35.0–45.0)
Hemoglobin: 13 g/dL (ref 11.7–15.5)
Lymphs Abs: 1346 cells/uL (ref 850–3900)
MCH: 28.3 pg (ref 27.0–33.0)
MCHC: 31.8 g/dL — ABNORMAL LOW (ref 32.0–36.0)
MCV: 88.9 fL (ref 80.0–100.0)
MPV: 10.8 fL (ref 7.5–12.5)
Monocytes Relative: 7.5 %
Neutro Abs: 2541 cells/uL (ref 1500–7800)
Neutrophils Relative %: 59.1 %
Platelets: 266 10*3/uL (ref 140–400)
RBC: 4.6 10*6/uL (ref 3.80–5.10)
RDW: 12.3 % (ref 11.0–15.0)
Total Lymphocyte: 31.3 %
WBC: 4.3 10*3/uL (ref 3.8–10.8)

## 2022-01-09 LAB — COMPLETE METABOLIC PANEL WITH GFR
AG Ratio: 1.7 (calc) (ref 1.0–2.5)
ALT: 18 U/L (ref 6–29)
AST: 24 U/L (ref 10–35)
Albumin: 4.5 g/dL (ref 3.6–5.1)
Alkaline phosphatase (APISO): 69 U/L (ref 37–153)
BUN: 16 mg/dL (ref 7–25)
CO2: 23 mmol/L (ref 20–32)
Calcium: 9.8 mg/dL (ref 8.6–10.4)
Chloride: 107 mmol/L (ref 98–110)
Creat: 0.87 mg/dL (ref 0.60–1.00)
Globulin: 2.7 g/dL (calc) (ref 1.9–3.7)
Glucose, Bld: 80 mg/dL (ref 65–99)
Potassium: 4.2 mmol/L (ref 3.5–5.3)
Sodium: 142 mmol/L (ref 135–146)
Total Bilirubin: 0.4 mg/dL (ref 0.2–1.2)
Total Protein: 7.2 g/dL (ref 6.1–8.1)
eGFR: 71 mL/min/{1.73_m2} (ref >=60)

## 2022-01-09 LAB — C-REACTIVE PROTEIN: CRP: 1.9 mg/L (ref <8.0)

## 2022-01-09 NOTE — Progress Notes (Signed)
Lab results look fine for continuing the leflunomide as planned. I think she should continue this for a while longer to see if the joint swelling improves more. If she has any serious flare ups before our next visit we could call in a steroid medication for a short time.

## 2022-01-16 ENCOUNTER — Telehealth: Payer: Self-pay

## 2022-01-16 NOTE — Telephone Encounter (Signed)
Patient called in stating that she had labs drawn on 01/08/2022 in our office and she is currently having bruising and swelling around site where the blood was drawn. Patient is concerned about this and would like to know what to do.  Please advisde, thanks!

## 2022-01-17 NOTE — Telephone Encounter (Signed)
I called patient, patient is improving.

## 2022-01-17 NOTE — Telephone Encounter (Signed)
This sounds like a bruise or hematoma associated with her blood draw. If the swelling and bruising is just around the site this is not usually a dangerous problem. She should avoid taking ibuprofen, aleve, aspirin, or the turmeric supplement for right now until this is improving.She can try using a warm compress on the area for pain or to try and speed up her body resolving the blood there. If this keeps getting bigger and more painful as time goes on or if she starts to see swelling or numbness further down her arm she should let us know.

## 2022-02-05 ENCOUNTER — Other Ambulatory Visit: Payer: Self-pay | Admitting: *Deleted

## 2022-02-05 DIAGNOSIS — M0579 Rheumatoid arthritis with rheumatoid factor of multiple sites without organ or systems involvement: Secondary | ICD-10-CM

## 2022-02-05 NOTE — Telephone Encounter (Signed)
Patient contacted the office requesting a call back regarding her prescription.   Attempted to contact the patient and left message for patient to call the office .

## 2022-02-06 MED ORDER — LEFLUNOMIDE 20 MG PO TABS: 20.0000 mg | ORAL_TABLET | Freq: Every day | ORAL | 1 refills | Status: DC

## 2022-02-06 NOTE — Telephone Encounter (Signed)
Patient states she needs refill on Arava.   Next Visit: 03/13/2022  Last Visit: 01/09/2023  Last Fill: 01/04/2022  DX: Rheumatoid arthritis with rheumatoid factor of multiple sites without organ or systems involvement   Current Dose per office note 01/08/2022: leflunomide 20 mg p.o. daily   Labs: 01/08/2022 Lab results look fine for continuing the leflunomide as planned.   Okay to refill Arava?

## 2022-02-06 NOTE — Addendum Note (Signed)
Addended by: Carole Binning on: 02/06/2022 08:58 AM   Modules accepted: Orders

## 2022-02-07 DIAGNOSIS — Z0100 Encounter for examination of eyes and vision without abnormal findings: Secondary | ICD-10-CM | POA: Diagnosis not present

## 2022-02-23 DIAGNOSIS — R052 Subacute cough: Secondary | ICD-10-CM | POA: Diagnosis not present

## 2022-02-23 DIAGNOSIS — R0981 Nasal congestion: Secondary | ICD-10-CM | POA: Diagnosis not present

## 2022-03-10 NOTE — Progress Notes (Signed)
Office Visit Note  Patient: Raven Haynes             Date of Birth: January 16, 1951           MRN: [id number]             PCP: Hoyt Koch, MD Referring: Hoyt Koch, * Visit Date: 03/13/2022   Subjective:  Follow-up   History of Present Illness: Raven Haynes is a 71 y.o. 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  So far she continues to experience some joint swelling in both upper extremities with this treatment.  She is noticing some swelling in her right elbow left wrist and in finger joints on both hands.  She has not noticed any swelling or increased symptoms affecting lower extremities.  She does not feel any specific intolerance to seeing the medication.  However has had a prolonged course with recent upper respiratory illness symptoms lasting for 3 weeks.   Previous HPI 01/08/22  Raven Haynes is a 71 y.o. 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 She is still having significant joint pain, stiffness, and swelling in hands and wrists and pain in knees. Left wrist has a new nodule as well that is swelling up and tender. Morning stiffness about 1-2 hours. She had one episode of severe diarrhea and abdominal pain after starting the medicine but otherwise fine.   Previous HPI 12/01/2021 Raven Haynes is a 71 y.o. female here for follow up for seropositive RA. At our last visit we stopped methotrexate due to concern about contributing to skin rashes and these resolved within 2 weeks of stopping the medication. However within 3-4 weeks she quickly redeveloped joint pain, swelling, and stiffness in both hands and stiffness and difficulty raising both shoulders.   Previous HPI 10/25/21 Raven Haynes is a 71 y.o. female here for follow up for seropositive RA on methotrexate 20 mg p.o. weekly.  She feels her arthritis symptoms have been very well controlled.  She is concerned about the medicine since she is noticing  breaking out with small mildly itchy raised red skin rashes on her left upper arm these appear to be worse for a few days with each dose of methotrexate that she takes.  She does not recall having any similar rashes in the past.   07/19/21 Raven Haynes is a 71 y.o. 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  She feels well without any flare up of arthritis symptoms. She developed skin rash recently along with her husband and seen in urgent care thought to be poison ivy exposure. She took a 6 day tapering course of prednisone with partial improvement. However still has some active red rashes a month later now. Including on her abdomen notices some extension or new spots spreading above the area.   Previous HPI 04/19/2021 Raven Haynes is a 71 y.o. 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 She feels joint symptoms are under control. She is staying very active chasing her grandchildren. She has noticed some worsening left eye vision blurriness when wearing contact lenses, but not when wearing glasses. She denies any pain, redness, or swelling on or around the eye.   Previous HPI 01/18/21 Raven Haynes is a 71 y.o. 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 Last month she suffered fracture to her right  7th and 8th ribs.  Besides this no major worsening of joint pain or swelling. New rash on the face without significant symptoms. No oral ulcers or pain. There is been no recurrence of the previous arm and truncal rash.   Previous HPI   02/17/20 Raven Haynes is a 71 y.o. female here for follow of seropositive RA since starting methotrexate about 6 weeks ago and on prednisone '10mg'$  daily. She was also seen in the interval by Dr. Junius Roads for right foot injection. She did not have any significant benefit. She was then recommended to have MRI for further assessment but had trouble getting this with insurance declined.  She tolerated decreasing prednisone to 5 mg daily without problems. Her hands feel fine but her foot pain is severely limiting weight bearing activities.   Review of Systems  Constitutional:  Positive for fatigue.  HENT:  Negative for mouth sores and mouth dryness.   Eyes:  Negative for dryness.  Respiratory:  Negative for shortness of breath.   Cardiovascular:  Negative for chest pain and palpitations.  Gastrointestinal:  Negative for blood in stool, constipation and diarrhea.  Endocrine: Negative for increased urination.  Genitourinary:  Negative for involuntary urination.  Musculoskeletal:  Positive for joint swelling. Negative for joint pain, gait problem, joint pain, myalgias, muscle weakness, morning stiffness, muscle tenderness and myalgias.  Skin:  Negative for color change, rash, hair loss and sensitivity to sunlight.  Allergic/Immunologic: Negative for susceptible to infections.  Neurological:  Negative for dizziness and headaches.  Hematological:  Negative for swollen glands.  Psychiatric/Behavioral:  Positive for depressed mood. Negative for sleep disturbance. The patient is not nervous/anxious.     PMFS History:  Patient Active Problem List   Diagnosis Date Noted   Olecranon bursitis, right elbow 03/13/2022   Ganglion cyst of wrist, left 03/13/2022   Blurred vision, left eye 04/19/2021   Pain in right ankle and joints of right foot 02/17/2020   Rheumatoid arthritis with rheumatoid factor of multiple sites without organ or systems involvement ([place]) 12/30/2019   High risk medication use 12/30/2019   Right knee pain 12/03/2019   Lesion of nose 05/22/2019   Impaired fasting blood sugar 12/19/2018   Routine general medical examination at a health care facility 11/15/2015   Essential hypertension 03/22/2015    Past Medical History:  Diagnosis Date   Hypertension    Poison ivy dermatitis 07/19/2021   Rheumatoid arthritis ([place])     Family History  Problem Relation Age of  Onset   Diabetes Mother    Diabetes Sister    Breast cancer Neg Hx    Past Surgical History:  Procedure Laterality Date   KNEE SURGERY Bilateral 2004,2009   Social History   Social History Narrative   Not on file   Immunization History  Administered Date(s) Administered   Fluad Quad(high Dose 65+) 12/19/2018, 12/04/2019   Influenza, High Dose Seasonal PF 12/10/2016, 12/13/2017   Influenza,inj,Quad PF,6+ Mos 11/15/2015   PFIZER(Purple Top)SARS-COV-2 Vaccination 03/31/2019, 04/18/2019   Pneumococcal Conjugate-13 03/22/2015   Pneumococcal Polysaccharide-23 03/10/2014   Tdap 09/09/2011   Zoster Recombinat (Shingrix) 10/04/2017   Zoster, Live 12/24/2013     Objective: Vital Signs: BP (!) 196/94 (BP Location: Left Arm, Patient Position: Sitting, Cuff Size: Normal)   Pulse 64   Resp 14   Ht '5\' 2"'$  (1.575 m)   Wt 160 lb (72.6 kg)   BMI 29.26 kg/m    Physical Exam Eyes:     Conjunctiva/sclera: Conjunctivae normal.  Cardiovascular:  Rate and Rhythm: Normal rate and regular rhythm.  Pulmonary:     Effort: Pulmonary effort is normal.     Breath sounds: Normal breath sounds.  Lymphadenopathy:     Cervical: No cervical adenopathy.  Skin:    General: Skin is warm and dry.  Neurological:     Mental Status: She is alert.  Psychiatric:        Mood and Affect: Mood normal.      Musculoskeletal Exam:  Shoulders full ROM no tenderness or swelling Elbows full ROM right olecranon bursitis without erythema or warmth, no tenderness Left wrist cyst present on radial side of forearm, right wrist normal Right and left 2nd and 3rd MCP swelling, no tenderness to pressure No paraspinal tenderness to palpation over upper and lower back Hip normal internal and external rotation without pain, no tenderness to lateral hip palpation Knees full ROM no tenderness or swelling, patellofemoral crepitus b/l   CDAI Exam: CDAI Score: 13  Patient Global: 20 mm; Provider Global: 40  mm Swollen: 6 ; Tender: 1  Joint Exam 03/13/2022      Right  Left  Elbow  Swollen      Wrist     Swollen Tender  MCP 2  Swollen   Swollen   MCP 3  Swollen   Swollen      Investigation: No additional findings.  Imaging: No results found.  Recent Labs: Lab Results  Component Value Date   WBC 4.3 01/08/2022   HGB 13.0 01/08/2022   PLT 266 01/08/2022   NA 142 01/08/2022   K 4.2 01/08/2022   CL 107 01/08/2022   CO2 23 01/08/2022   GLUCOSE 80 01/08/2022   BUN 16 01/08/2022   CREATININE 0.87 01/08/2022   BILITOT 0.4 01/08/2022   ALKPHOS 65 12/21/2019   AST 24 01/08/2022   ALT 18 01/08/2022   PROT 7.2 01/08/2022   ALBUMIN 4.5 12/21/2019   CALCIUM 9.8 01/08/2022   GFRAA 93 06/22/2020   QFTBGOLDPLUS NEGATIVE 12/30/2019    Speciality Comments: No specialty comments available.  Procedures:  No procedures performed Allergies: Patient has no known allergies.   Assessment / Plan:     Visit Diagnoses: Rheumatoid arthritis with rheumatoid factor of multiple sites without organ or systems involvement ([place]) - Plan: C-reactive protein, Sedimentation rate, predniSONE (DELTASONE) 5 MG tablet  Disease activity appears equal or worse compared to our last visit despite ongoing treatment with the leflunomide.  Will recheck sedimentation rate and CRP for disease activity assessment.  I recommend we need to start a biologic DMARD treatment due to intolerance to methotrexate and nonresponse to leflunomide and I do not believe hydroxychloroquine is likely to be successful monotherapy in highly active disease.  Plan to start Humira 40 mg subcu q. 14 days.  Prescription for short-term prednisone taper sent for the multiple currently inflamed joints.  High risk medication use - Plan: CBC with Differential/Platelet, COMPLETE METABOLIC PANEL WITH GFR, QuantiFERON-TB Gold Plus  Checking CBC and CMP for ongoing leflunomide treatment monitoring.  Getting this as well as QuantiFERON screening  discussing start of Humira.  Reviewed risk of medication including infections, malignancy, injection site reactions, allergic drug reaction, and need for regular laboratory monitoring.  She has no personal history of cancer no history of heart failure.  Olecranon bursitis, right elbow  No concerning features such as erythema or warmth.  Does not recall any trauma to the area.  Recommended using a supporting or offloading type elbow pad or wrap to  protect the area for now.  Also hopefully this will improve with short-term steroid treatment since I also suspect is primarily related to active rheumatoid arthritis.  Ganglion cyst of wrist, left  Discussed ganglion cyst likely associated to some underlying tenosynovitis or tendinitis issue.  Probably related to the rheumatoid arthritis rather than mechanical issue primarily since she does not recall any activity or injury accounting for this and never had cyst in this area before.  Orders: Orders Placed This Encounter  Procedures   C-reactive protein   Sedimentation rate   CBC with Differential/Platelet   COMPLETE METABOLIC PANEL WITH GFR   QuantiFERON-TB Gold Plus   Meds ordered this encounter  Medications   predniSONE (DELTASONE) 5 MG tablet    Sig: Take 4 tablets (20 mg total) by mouth daily with breakfast for 3 days, THEN 3 tablets (15 mg total) daily with breakfast for 3 days, THEN 2 tablets (10 mg total) daily with breakfast for 3 days, THEN 1 tablet (5 mg total) daily with breakfast for 3 days.    Dispense:  30 tablet    Refill:  0     Follow-Up Instructions: No follow-ups on file.   Collier Salina, MD  Note - This record has been created using [employer].  Chart creation errors have been sought, but may not always  have been located. Such creation errors do not reflect on  the standard of medical care.

## 2022-03-13 ENCOUNTER — Encounter: Payer: Self-pay | Admitting: Internal Medicine

## 2022-03-13 ENCOUNTER — Ambulatory Visit: Payer: Medicare HMO | Attending: Internal Medicine | Admitting: Internal Medicine

## 2022-03-13 VITALS — BP 196/94 | HR 64 | Resp 14 | Ht 62.0 in | Wt 160.0 lb

## 2022-03-13 DIAGNOSIS — M67432 Ganglion, left wrist: Secondary | ICD-10-CM | POA: Insufficient documentation

## 2022-03-13 DIAGNOSIS — M7021 Olecranon bursitis, right elbow: Secondary | ICD-10-CM

## 2022-03-13 DIAGNOSIS — M0579 Rheumatoid arthritis with rheumatoid factor of multiple sites without organ or systems involvement: Secondary | ICD-10-CM

## 2022-03-13 DIAGNOSIS — Z79899 Other long term (current) drug therapy: Secondary | ICD-10-CM | POA: Diagnosis not present

## 2022-03-13 HISTORY — DX: Olecranon bursitis, right elbow: M70.21

## 2022-03-13 MED ORDER — PREDNISONE 5 MG PO TABS: ORAL_TABLET | ORAL | 0 refills | Status: AC

## 2022-03-13 NOTE — Patient Instructions (Addendum)
Adalimumab Injection What is this medication? ADALIMUMAB (ay da LIM yoo mab) treats autoimmune conditions, such as psoriasis, arthritis, Crohn's disease, and ulcerative colitis. It works by slowing down an overactive immune system. It belongs to a group of medications called TNF inhibitors. It is a monoclonal antibody. This medicine may be used for other purposes; ask your health care provider or pharmacist if you have questions. COMMON BRAND NAME(S): AMJEVITA, CYLTEZO, HADLIMA, Hulio, Hulio PEN, Humira, Hyrimoz, Idacio, YUFLYMA, YUSIMRY What should I tell my care team before I take this medication? They need to know if you have any of these conditions: Cancer Diabetes (high blood sugar) Having surgery Heart disease Hepatitis B Immune system problems Infections, such as tuberculosis (TB) or other bacterial, fungal, or viral infections Multiple sclerosis Recent or upcoming vaccine An unusual or allergic reaction to adalimumab, mannitol, latex, rubber, other medications, foods, dyes, or preservatives Pregnant or trying to get pregnant Breast-feeding How should I use this medication? This medication is injected under the skin. It may be given by your care team in a hospital or clinic setting. It may also be given at home. If you get this medication at home, you will be taught how to prepare and give it. Use exactly as directed. Take it as directed on the prescription label. Keep taking it unless your care team tells you to stop. This medication comes with INSTRUCTIONS FOR USE. Ask your pharmacist for directions on how to use this medication. Read the information carefully. Talk to your pharmacist or care team if you have questions. It is important that you put your used needles and syringes in a special sharps container. Do not put them in a trash can. If you do not have a sharps container, call your pharmacist or care team to get one. A special MedGuide will be given to you by the pharmacist  with each prescription and refill. If you are getting this medication in a hospital or clinic, a special MedGuide will be given to you before each treatment. Be sure to read this information carefully each time. Talk to your care team about the use of this medication in children. While it be prescribed for children as young as 2 years for selected conditions, precautions do apply. Overdosage: If you think you have taken too much of this medicine contact a poison control center or emergency room at once. NOTE: This medicine is only for you. Do not share this medicine with others. What if I miss a dose? If you get this medication at the hospital or clinic: it is important not to miss your dose. Call your care team if you are unable to keep an appointment. If you give yourself this medication at home: If you miss a dose, take it as soon as you can. If it is almost time for your next dose, take only that dose. Do not take double or extra doses. Call your care team with questions. What may interact with this medication? Do not take this medication with any of the following: Abatacept [place] Biologic medications, such as certolizumab, etanercept, golimumab, infliximab Live virus vaccines This medication may also interact with the following: Cyclosporine Theophylline Vaccines Warfarin This list may not describe all possible interactions. Give your health care provider a list of all the medicines, herbs, non-prescription drugs, or dietary supplements you use. Also tell them if you smoke, drink alcohol, or use illegal drugs. Some items may interact with your medicine. What should I watch for while using this medication? Visit  your care team for regular checks on your progress. Tell your care team if your symptoms do not start to get better or if they get worse. You will be tested for tuberculosis (TB) before you start this medication. If your care team prescribes any medication for TB, you should start  taking the TB medication before starting this medication. Make sure to finish the full course of TB medication. This medication may increase your risk of getting an infection. Call your care team for advice if you get a fever, chills, sore throat, or other symptoms of a cold or flu. Do not treat yourself. Try to avoid being around people who are sick. Talk to your care team about your risk of cancer. You may be more at risk for certain types of cancer if you take this medication. What side effects may I notice from receiving this medication? Side effects that you should report to your care team as soon as possible: Allergic reactions--skin rash, itching, hives, swelling of the face, lips, tongue, or throat Aplastic anemia--unusual weakness or fatigue, dizziness, headache, trouble breathing, increased bleeding or bruising Body pain, tingling, or numbness Heart failure--shortness of breath, swelling of the ankles, feet, or hands, sudden weight gain, unusual weakness or fatigue Infection--fever, chills, cough, sore throat, wounds that don't heal, pain or trouble when passing urine, general feeling of discomfort or being unwell Lupus-like syndrome--joint pain, swelling, or stiffness, butterfly-shaped rash on the face, rashes that get worse in the sun, fever, unusual weakness or fatigue Unusual bruising or bleeding Side effects that usually do not require medical attention (report to your care team if they continue or are bothersome): Headache Nausea Pain, redness, or irritation at injection site Runny or stuffy nose Sore throat Stomach pain This list may not describe all possible side effects. Call your doctor for medical advice about side effects. You may report side effects to FDA at 1-800-FDA-1088. Where should I keep my medication? Keep out of the reach of children and pets. Store in the refrigerator. Do not freeze. Keep this medication in the original packaging until you are ready to take it.  Protect from light. Get rid of any unused medication after the expiration date. This medication may be stored at room temperature for up to 14 days. Keep this medication in the original packaging. Protect from light. If it is stored at room temperature, get rid of any unused medication after 14 days or after it expires, whichever is first. To get rid of medications that are no longer needed or have expired: Take the medication to a medication take-back program. Check with your pharmacy or law enforcement to find a location. If you cannot return the medication, ask your pharmacist or care team how to get rid of this medication safely. NOTE: This sheet is a summary. It may not cover all possible information. If you have questions about this medicine, talk to your doctor, pharmacist, or health care provider.  2023 Elsevier/Gold Standard (2021-05-11 00:00:00)

## 2022-03-14 ENCOUNTER — Telehealth: Payer: Self-pay | Admitting: Pharmacist

## 2022-03-14 NOTE — Telephone Encounter (Addendum)
Please start HUMIRA BIV. Pending OV note from 03/13/2022 to be signed  Dose: '40MG'$  SQ every 14 days  Knox Saliva, PharmD, MPH, BCPS, CPP Clinical Pharmacist (Rheumatology and Pulmonology)  ----- Message from Shona Needles, RT sent at 03/13/2022  3:28 PM EST ----- Regarding: NEW START HUMIRA

## 2022-03-15 LAB — COMPLETE METABOLIC PANEL WITH GFR
AG Ratio: 1.7 (calc) (ref 1.0–2.5)
ALT: 26 U/L (ref 6–29)
AST: 23 U/L (ref 10–35)
Albumin: 4.4 g/dL (ref 3.6–5.1)
Alkaline phosphatase (APISO): 76 U/L (ref 37–153)
BUN: 13 mg/dL (ref 7–25)
CO2: 27 mmol/L (ref 20–32)
Calcium: 9.1 mg/dL (ref 8.6–10.4)
Chloride: 107 mmol/L (ref 98–110)
Creat: 0.72 mg/dL (ref 0.60–1.00)
Globulin: 2.6 g/dL (calc) (ref 1.9–3.7)
Glucose, Bld: 108 mg/dL — ABNORMAL HIGH (ref 65–99)
Potassium: 4.5 mmol/L (ref 3.5–5.3)
Sodium: 143 mmol/L (ref 135–146)
Total Bilirubin: 0.5 mg/dL (ref 0.2–1.2)
Total Protein: 7 g/dL (ref 6.1–8.1)
eGFR: 89 mL/min/{1.73_m2} (ref >=60)

## 2022-03-15 LAB — CBC WITH DIFFERENTIAL/PLATELET
Absolute Monocytes: 348 cells/uL (ref 200–950)
Basophils Absolute: 11 cells/uL (ref 0–200)
Basophils Relative: 0.3 %
Eosinophils Absolute: 81 cells/uL (ref 15–500)
Eosinophils Relative: 2.2 %
HCT: 38.2 % (ref 35.0–45.0)
Hemoglobin: 12.8 g/dL (ref 11.7–15.5)
Lymphs Abs: 1476 cells/uL (ref 850–3900)
MCH: 28.4 pg (ref 27.0–33.0)
MCHC: 33.5 g/dL (ref 32.0–36.0)
MCV: 84.7 fL (ref 80.0–100.0)
MPV: 10.6 fL (ref 7.5–12.5)
Monocytes Relative: 9.4 %
Neutro Abs: 1783 cells/uL (ref 1500–7800)
Neutrophils Relative %: 48.2 %
Platelets: 229 10*3/uL (ref 140–400)
RBC: 4.51 10*6/uL (ref 3.80–5.10)
RDW: 12.8 % (ref 11.0–15.0)
Total Lymphocyte: 39.9 %
WBC: 3.7 10*3/uL — ABNORMAL LOW (ref 3.8–10.8)

## 2022-03-15 LAB — QUANTIFERON-TB GOLD PLUS
Mitogen-NIL: >10 IU/mL
NIL: 0.02 IU/mL
QuantiFERON-TB Gold Plus: NEGATIVE
TB1-NIL: 0.02 IU/mL
TB2-NIL: 0.05 IU/mL

## 2022-03-15 LAB — SEDIMENTATION RATE: Sed Rate: 2 mm/h (ref 0–30)

## 2022-03-15 LAB — C-REACTIVE PROTEIN: CRP: 0.8 mg/L (ref <8.0)

## 2022-03-22 ENCOUNTER — Telehealth: Payer: Self-pay | Admitting: *Deleted

## 2022-03-22 NOTE — Telephone Encounter (Signed)
Patient contacted the office to inquire about the status of the Humira. Patient advised the pharmacy team is working on the [place] and will contact her once they have approval. Patient would like to know if she can continue to take the [place] until she can start the Humira.

## 2022-03-22 NOTE — Telephone Encounter (Signed)
Note is signed, thanks.

## 2022-03-22 NOTE — Telephone Encounter (Signed)
Patient advised yes she can stay on the leflunomide in the meanwhile. Her lab results show very slightly low WBC of 3.7 that may be related to the medication but not a problem. Other results are fine for starting the Humira. The sed rate and CRP are normal despite her increase in joint swelling. Patient expressed understanding.

## 2022-03-22 NOTE — Telephone Encounter (Signed)
Yes she can stay on the leflunomide in the meanwhile. Her lab results show very slightly low WBC of 3.7 that may be related to the medication but not a problem. Other results are fine for starting the Humira. The sed rate and CRP are normal despite her increase in joint swelling.

## 2022-03-23 NOTE — Telephone Encounter (Signed)
Submitted a Prior Authorization request to [facility] for HUMIRA via CoverMyMeds. Will update once we receive a response.  Key: Doreatha Massed, PharmD, MPH, BCPS, CPP Clinical Pharmacist (Rheumatology and Pulmonology)

## 2022-03-27 ENCOUNTER — Other Ambulatory Visit (HOSPITAL_COMMUNITY): Payer: Self-pay

## 2022-03-27 NOTE — Telephone Encounter (Signed)
Received notification from [facility] regarding a prior authorization for [place]. Authorization has been APPROVED from 03/12/2022 to 03/12/2023. Approval letter sent to scan center.  Per test claim, copay for 28 days supply is $2105.29  Authorization # [id number]  Spoke with patient regarding [employer] patient assistance. She requested her portion be emailed. Provider portion placed in Dr. Marveen Reeks folder for signature  Knox Saliva, PharmD, MPH, BCPS, CPP Clinical Pharmacist (Rheumatology and Pulmonology)

## 2022-03-28 NOTE — Telephone Encounter (Signed)
Signed provider form received from Dr. Benjamine Mola. Submission is pending pt portion to be returned  Knox Saliva, PharmD, MPH, BCPS, CPP Clinical Pharmacist (Rheumatology and Pulmonology)

## 2022-03-29 ENCOUNTER — Telehealth: Payer: Self-pay | Admitting: *Deleted

## 2022-03-29 NOTE — Telephone Encounter (Signed)
Patient contacted the office and left message stating she has some questions about the paperwork that was sent to her for the Humira. Patient requesting a return call at [phone].

## 2022-03-30 NOTE — Telephone Encounter (Signed)
Spoke to patient's husband, he has questions about the new Humira biosimilars that are available to discuss with his insurance contact. Insurance rep wants to know what biosimilars should they look into for patient- asking for the "suffix".  Husband said they want to look into generics before moving forward with PAP application.  Advised that Rph would have to discuss biosimilars with him.  He requested call back at [phone], husband Clare Gandy

## 2022-03-30 NOTE — Telephone Encounter (Signed)
Returned call to patient's husband Clare Gandy. Advised that all biosimilars would require prior authorization and likely cost would be similar and we''d have to explore other assistance. He states he will complete form and return to clinic  Knox Saliva, PharmD, MPH, BCPS, CPP Clinical Pharmacist (Rheumatology and Pulmonology)

## 2022-04-02 NOTE — Telephone Encounter (Signed)
Received signed patient form. Submitted Patient Assistance Application to [employer] for [place] along with provider portion, patient portion, med list, insurance card copy, and PA. Will update patient when we receive a response.  Fax# [phone] Phone# [phone]  Knox Saliva, PharmD, MPH, BCPS, CPP Clinical Pharmacist (Rheumatology and Pulmonology)

## 2022-04-05 ENCOUNTER — Encounter: Payer: Self-pay | Admitting: Internal Medicine

## 2022-04-05 ENCOUNTER — Ambulatory Visit (INDEPENDENT_AMBULATORY_CARE_PROVIDER_SITE_OTHER): Payer: Medicare HMO

## 2022-04-05 ENCOUNTER — Ambulatory Visit (INDEPENDENT_AMBULATORY_CARE_PROVIDER_SITE_OTHER): Payer: Medicare HMO | Admitting: Internal Medicine

## 2022-04-05 VITALS — BP 150/88 | HR 84 | Temp 98.2°F | Ht 62.0 in | Wt 155.0 lb

## 2022-04-05 DIAGNOSIS — M0579 Rheumatoid arthritis with rheumatoid factor of multiple sites without organ or systems involvement: Secondary | ICD-10-CM | POA: Diagnosis not present

## 2022-04-05 DIAGNOSIS — I10 Essential (primary) hypertension: Secondary | ICD-10-CM

## 2022-04-05 DIAGNOSIS — Z0000 Encounter for general adult medical examination without abnormal findings: Secondary | ICD-10-CM

## 2022-04-05 DIAGNOSIS — R7301 Impaired fasting glucose: Secondary | ICD-10-CM | POA: Diagnosis not present

## 2022-04-05 DIAGNOSIS — R052 Subacute cough: Secondary | ICD-10-CM

## 2022-04-05 DIAGNOSIS — R059 Cough, unspecified: Secondary | ICD-10-CM | POA: Diagnosis not present

## 2022-04-05 LAB — LIPID PANEL
Cholesterol: 214 mg/dL — ABNORMAL HIGH (ref 0–200)
HDL: 49.7 mg/dL (ref >39.00)
LDL Cholesterol: 133 mg/dL — ABNORMAL HIGH (ref 0–99)
NonHDL: 164.52
Total CHOL/HDL Ratio: 4
Triglycerides: 159 mg/dL — ABNORMAL HIGH (ref 0.0–149.0)
VLDL: 31.8 mg/dL (ref 0.0–40.0)

## 2022-04-05 LAB — HEMOGLOBIN A1C: Hgb A1c MFr Bld: 6 % (ref 4.6–6.5)

## 2022-04-05 MED ORDER — LOSARTAN POTASSIUM 25 MG PO TABS: 25.0000 mg | ORAL_TABLET | Freq: Every day | ORAL | 3 refills | Status: DC

## 2022-04-05 MED ORDER — HYDROCHLOROTHIAZIDE 25 MG PO TABS: 25.0000 mg | ORAL_TABLET | Freq: Every day | ORAL | 3 refills | Status: DC

## 2022-04-05 NOTE — Assessment & Plan Note (Signed)
Flu shot declines. Covid-19 counseled. Pneumonia has had 2 but before 65 declines further. Shingrix had 1 dose declines 2nd dose. Tetanus due declines. Colonoscopy up to date. Mammogram up to date due 2025, pap smear aged out and dexa due 2026. Counseled about sun safety and mole surveillance. Counseled about the dangers of distracted driving. Given 10 year screening recommendations.

## 2022-04-05 NOTE — Assessment & Plan Note (Signed)
EKG done due to elevated BP today. She is checking and it is 120-130 /80 at night time. She is in flare of RA and suspect this is contributing. EKG without changes so will monitor closely and adjust as needed if this does not come down with RA flare resolution.

## 2022-04-05 NOTE — Assessment & Plan Note (Signed)
Started getting ill around Thanksgiving and treated with antibiotics mid-Dec and did improve then worsened after travel to [place]. Suspect second viral infection and sluggish to resolve. Lungs clear but due to immunocompromised status needs CXR today which is ordered.

## 2022-04-05 NOTE — Assessment & Plan Note (Signed)
Checking HgA1c and adjust as needed.  

## 2022-04-05 NOTE — Patient Instructions (Signed)
We did the EKG which is normal and will check the chest x-ray.

## 2022-04-05 NOTE — Assessment & Plan Note (Signed)
With some flare and rheumatology trying new medication. Recent CBC and CMP appropriate.

## 2022-04-05 NOTE — Progress Notes (Signed)
Subjective:   Patient ID: Raven Haynes, female    DOB: 12/08/1950, 72 y.o.   MRN: [id number]  HPI Here for medicare wellness and physical, with new complaints. Please see A/P for status and treatment of chronic medical problems.   Diet: heart healthy  Physical activity: walk daily Depression/mood screen: negative Hearing: intact to whispered voice Visual acuity: grossly normal, performs annual eye exam  ADLs: capable Fall risk: none Home safety: good Cognitive evaluation: intact to orientation, naming, recall and repetition EOL planning: adv directives discussed  [place] Visit from 04/05/2022 in [place] at [facility]  PHQ-2 Total Score 0           02/23/2020    9:01 AM 02/23/2020    9:05 AM 04/03/2021    2:09 PM 04/03/2021    2:15 PM 04/05/2022    9:55 AM  [place] in the past year? 0 0 1 0 0  Was there an injury with Fall? 0 0 0 0 0  Fall Risk Category Calculator 0 0 1 0 0  Fall Risk Category (Retired) Low Low Low Low   (RETIRED) Patient Fall Risk Level Low fall risk Low fall risk  Low fall risk   Patient at Risk for Falls Due to  No Fall Risks  No Fall Risks No Fall Risks  Fall risk Follow up  Falls evaluation completed  Falls evaluation completed Falls evaluation completed    I have personally reviewed and have noted 1. The patient's medical and social history - reviewed today no changes 2. Their use of alcohol, tobacco or illicit drugs 3. Their current medications and supplements 4. The patient's functional ability including ADL's, fall risks, home safety risks and hearing or visual impairment. 5. Diet and physical activities 6. Evidence for depression or mood disorders 7. Care team reviewed and updated 8.  The patient is not on an opioid pain medication   Patient Care Team: Hoyt Koch, MD as PCP - General (Internal Medicine) Szabat, Darnelle Maffucci, [facility] (Inactive) as Pharmacist (Pharmacist) Katy Apo, MD as  Consulting Physician (Ophthalmology) Past Medical History:  Diagnosis Date   Hypertension    Poison ivy dermatitis 07/19/2021   Rheumatoid arthritis [facility])    Past Surgical History:  Procedure Laterality Date   KNEE SURGERY Bilateral 2004,2009   Family History  Problem Relation Age of Onset   Diabetes Mother    Diabetes Sister    Breast cancer Neg Hx    EKG: Rate 72, axis normal, interval normal, sinus, no st or t wave changes, no significant change compared to prior 2021  Review of Systems  Constitutional: Negative.  Negative for activity change, chills and unexpected weight change.  HENT:  Positive for congestion.   Eyes: Negative.   Respiratory:  Positive for cough. Negative for chest tightness and shortness of breath.   Cardiovascular:  Negative for chest pain, palpitations and leg swelling.  Gastrointestinal:  Negative for abdominal distention, abdominal pain, constipation, diarrhea, nausea and vomiting.  Musculoskeletal: Negative.   Skin: Negative.   Neurological: Negative.   Psychiatric/Behavioral: Negative.      Objective:  Physical Exam Constitutional:      Appearance: She is well-developed.  HENT:     Head: Normocephalic and atraumatic.  Cardiovascular:     Rate and Rhythm: Normal rate and regular rhythm.  Pulmonary:     Effort: Pulmonary effort is normal. No respiratory distress.     Breath sounds: Normal breath sounds. No  wheezing or rales.  Abdominal:     General: Bowel sounds are normal. There is no distension.     Palpations: Abdomen is soft.     Tenderness: There is no abdominal tenderness. There is no rebound.  Musculoskeletal:     Cervical back: Normal range of motion.  Skin:    General: Skin is warm and dry.  Neurological:     Mental Status: She is alert and oriented to person, place, and time.     Coordination: Coordination normal.     Vitals:   04/05/22 0946 04/05/22 0959  BP: (!) 160/90 (!) 150/88  Pulse: 84   Temp: 98.2 F (36.8 C)    TempSrc: Oral   SpO2: 98%   Weight: 155 lb (70.3 kg)   Height: '5\' 2"'$  (1.575 m)     Assessment & Plan:

## 2022-04-07 LAB — HM DEXA SCAN: HM Dexa Scan: NORMAL

## 2022-04-11 NOTE — Telephone Encounter (Signed)
Spoke with [employer] pt assistance rep. She stated this case has been assigned to someone and we should hear back by the end of the week.   Maryan Puls, PharmD PGY-1 [facility] Pharmacy Resident

## 2022-04-11 NOTE — Telephone Encounter (Signed)
Received a fax from  [place] regarding an approval for [place] patient assistance from 04/11/2022 to 03/12/2023. Approval letter sent to scan center.  Phone number: [phone]  Patient can be scheduled for Humira new start appt  Knox Saliva, PharmD, MPH, BCPS, CPP Clinical Pharmacist (Rheumatology and Pulmonology)

## 2022-04-12 ENCOUNTER — Encounter: Payer: Self-pay | Admitting: Internal Medicine

## 2022-04-12 NOTE — Telephone Encounter (Signed)
Patient scheduled for Humira new start on 04/16/2022. Advised her to schedule Humira shipment to arrive to her home by 04/30/2022 if possible. She will be accompanied by her husband.  Knox Saliva, PharmD, MPH, BCPS, CPP Clinical Pharmacist (Rheumatology and Pulmonology)

## 2022-04-16 ENCOUNTER — Ambulatory Visit: Payer: Medicare HMO | Admitting: Pharmacist

## 2022-04-17 ENCOUNTER — Ambulatory Visit: Payer: Medicare HMO | Attending: Internal Medicine | Admitting: Pharmacist

## 2022-04-17 DIAGNOSIS — Z79899 Other long term (current) drug therapy: Secondary | ICD-10-CM

## 2022-04-17 DIAGNOSIS — M0579 Rheumatoid arthritis with rheumatoid factor of multiple sites without organ or systems involvement: Secondary | ICD-10-CM

## 2022-04-17 DIAGNOSIS — Z7189 Other specified counseling: Secondary | ICD-10-CM

## 2022-04-17 MED ORDER — HUMIRA (2 PEN) 40 MG/0.4ML ~~LOC~~ AJKT: 40.0000 mg | AUTO-INJECTOR | SUBCUTANEOUS | 0 refills | Status: DC

## 2022-04-17 NOTE — Progress Notes (Unsigned)
Pharmacy Note  Subjective:   Patient presents to clinic today to receive first dose of Humira for RA. She is accompanied by her husband, Clare Gandy, today. Patient currently takes leflunomide '20mg'$  daily.  Patient running a fever or have signs/symptoms of infection? No  Patient currently on antibiotics for the treatment of infection? No  Patient have any upcoming invasive procedures/surgeries? No  Objective: CMP     Component Value Date/Time   NA 143 03/13/2022 1526   K 4.5 03/13/2022 1526   CL 107 03/13/2022 1526   CO2 27 03/13/2022 1526   GLUCOSE 108 (H) 03/13/2022 1526   BUN 13 03/13/2022 1526   CREATININE 0.72 03/13/2022 1526   CALCIUM 9.1 03/13/2022 1526   PROT 7.0 03/13/2022 1526   ALBUMIN 4.5 12/21/2019 1440   AST 23 03/13/2022 1526   ALT 26 03/13/2022 1526   ALKPHOS 65 12/21/2019 1440   BILITOT 0.5 03/13/2022 1526   GFRNONAA 80 06/22/2020 1422   GFRAA 93 06/22/2020 1422    CBC    Component Value Date/Time   WBC 3.7 (L) 03/13/2022 1526   RBC 4.51 03/13/2022 1526   HGB 12.8 03/13/2022 1526   HCT 38.2 03/13/2022 1526   PLT 229 03/13/2022 1526   MCV 84.7 03/13/2022 1526   MCH 28.4 03/13/2022 1526   MCHC 33.5 03/13/2022 1526   RDW 12.8 03/13/2022 1526   LYMPHSABS 1,476 03/13/2022 1526   MONOABS 0.4 12/21/2019 1440   EOSABS 81 03/13/2022 1526   BASOSABS 11 03/13/2022 1526    Baseline Immunosuppressant Therapy Labs TB GOLD    Latest Ref Rng & Units 03/13/2022    3:26 PM  Quantiferon TB Gold  Quantiferon TB Gold Plus NEGATIVE NEGATIVE    Hepatitis Panel    Latest Ref Rng & Units 12/30/2019    9:43 AM  Hepatitis  Hep B Surface Ag NON-REACTI NON-REACTIVE   Hep B IgM NON-REACTI NON-REACTIVE   Hep C Ab NON-REACTI NON-REACTIVE    HIV No results found for: "HIV" Immunoglobulins   SPEP    Latest Ref Rng & Units 03/13/2022    3:26 PM  Serum Protein Electrophoresis  Total Protein 6.1 - 8.1 g/dL 7.0    G6PD No results found for: "G6PDH" TPMT No results  found for: "TPMT"   Chest x-ray: 04/05/2022 - No active cardiopulmonary disease.  Assessment/Plan:  Counseled patient that Humira is a TNF blocking agent.  Counseled patient on purpose, proper use, and adverse effects of Humira.  Reviewed the most common adverse effects including infections, headache, and injection site reactions. Discussed that there is the possibility of an increased risk of malignancy including non-melanoma skin cancer but it is not well understood if this increased risk is due to the medication or the disease state.  Advised patient to get yearly dermatology exams due to risk of skin cancer. Counseled patient that Humira should be held prior to scheduled surgery.    Reviewed the importance of regular labs while on Humira therapy.  Will monitor CBC and CMP 1 month after starting and then every 3 months routinely thereafter. Will monitor TB gold annually. Standing orders placed.    Provided patient with medication education material and answered all questions.   Reviewed storage instructions of Humira.  Patient verbalized understanding.   Reviewed importance of holding HUMIRA and leflunomide with signs/symptoms of an infections, if antibiotics are prescribed to treat an active infection, and with invasive procedures  Demonstrated proper injection technique with HUMIRA demo device  Patient able to  demonstrate proper injection technique using the teach back method.  Patient's husband injected in the right upper thigh with:  Sample Medication: Humira '40mg'$ /0.72m autoinjector pen NDC: [phone]: [id number]: 12/2022  Patient tolerated well.  Observed for 30 mins in office for adverse reaction and none noted.   Patient is to return in 1 month for labs and 6-8 weeks for follow-up appointment.  Standing orders placed.   HUMIRA approved through patient assistance .   Rx sent to: [employer] [facility] for Humira/[facility]: [phone]  Patient provided with pharmacy phone  number and advised to call later today to schedule shipment to home.  Patient will continue Humira '40mg'$  subcut every 14 days in combination with leflunomide '20mg'$  daily.  All questions encouraged and answered.  Instructed patient to call with any further questions or concerns.  DKnox Saliva PharmD, MPH, BCPS, CPP Clinical Pharmacist (Rheumatology and Pulmonology)  04/17/2022 10:37 AM

## 2022-04-17 NOTE — Patient Instructions (Signed)
Your next HUMIRA dose is due on 05/01/2022, 05/15/2022, and every 14 days thereafter  CONTINUE leflunomide '20mg'$  daily  HOLD HUMIRA and LEFLUNOMIDE if you have signs or symptoms of an infection. You can resume once you feel better or back to your baseline. HOLD HUMIRA and LEFLUNOMIDE  if you start antibiotics to treat an infection. HOLD HUMIRA and LEFLUNOMIDE  around the time of surgery/procedures. Your surgeon will be able to provide recommendations on when to hold BEFORE and when you are cleared to [place].  Pharmacy information: Your prescription will be shipped from [facility]. Their phone number is [phone] Please call to schedule shipment and confirm address. They will mail your medication to your home.  Labs are due in 1 month then every 3 months. Lab hours are from Monday to Thursday 8am-12:30pm and 1pm-5pm and Friday 8am-12pm. You do not need an appointment if you come for labs during these times.  How to manage an injection site reaction: Remember the 5 C's: COUNTER - leave on the counter at least 30 minutes but up to overnight to bring medication to room temperature. This may help prevent stinging COLD - place something cold (like an ice gel pack or cold water bottle) on the injection site just before cleansing with alcohol. This may help reduce pain CLARITIN - use Claritin (generic name is loratadine) for the first two weeks of treatment or the day of, the day before, and the day after injecting. This will help to minimize injection site reactions CORTISONE CREAM - apply if injection site is irritated and itching CALL ME - if injection site reaction is bigger than the size of your fist, looks infected, blisters, or if you develop hives

## 2022-04-30 DIAGNOSIS — D225 Melanocytic nevi of trunk: Secondary | ICD-10-CM | POA: Diagnosis not present

## 2022-04-30 DIAGNOSIS — L821 Other seborrheic keratosis: Secondary | ICD-10-CM | POA: Diagnosis not present

## 2022-04-30 DIAGNOSIS — L814 Other melanin hyperpigmentation: Secondary | ICD-10-CM | POA: Diagnosis not present

## 2022-04-30 DIAGNOSIS — R238 Other skin changes: Secondary | ICD-10-CM | POA: Diagnosis not present

## 2022-05-07 ENCOUNTER — Telehealth: Payer: Self-pay | Admitting: *Deleted

## 2022-05-07 NOTE — Telephone Encounter (Signed)
Patient advised per Dr. Benjamine Mola, she can stop Arava and just continue Humira. This drug takes a long time to leave your system once stopped so does not need to be tapered.

## 2022-05-07 NOTE — Telephone Encounter (Signed)
Patient contacted the office stating that she is on [place] and Humira. Patient states that she was advised to stay on both medications until the Humira takes effect. Patient states she is now out of [place] and took her 2nd dose of Humira on 05/01/2022. Patient states her next dose of Humira is due on 05/15/2022. Patient would like to know if she should continue taking Arava or stop taking it. Please advise.

## 2022-05-07 NOTE — Telephone Encounter (Signed)
I think she can stop Arava and just continue Humira. This drug takes a long time to leave your system once stopped so does not need to be tapered.

## 2022-05-07 NOTE — Telephone Encounter (Signed)
Attempted to contact the patient and left message for patient to call the office.  

## 2022-05-14 ENCOUNTER — Ambulatory Visit: Payer: Medicare HMO | Admitting: Internal Medicine

## 2022-05-27 NOTE — Progress Notes (Unsigned)
Office Visit Note  Patient: Raven Haynes             Date of Birth: February 19, 1951           MRN: [id number]             PCP: Hoyt Koch, MD Referring: Hoyt Koch, * Visit Date: 05/28/2022   Subjective:  No chief complaint on file.   History of Present Illness: Raven Haynes is a 72 y.o. female here for follow up ***   Previous HPI 03/13/22 Raven Haynes is a 72 y.o. 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  So far she continues to experience some joint swelling in both upper extremities with this treatment.  She is noticing some swelling in her right elbow left wrist and in finger joints on both hands.  She has not noticed any swelling or increased symptoms affecting lower extremities.  She does not feel any specific intolerance to seeing the medication.  However has had a prolonged course with recent upper respiratory illness symptoms lasting for 3 weeks.     Previous HPI 01/08/22  Raven Haynes is a 72 y.o. 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 She is still having significant joint pain, stiffness, and swelling in hands and wrists and pain in knees. Left wrist has a new nodule as well that is swelling up and tender. Morning stiffness about 1-2 hours. She had one episode of severe diarrhea and abdominal pain after starting the medicine but otherwise fine.   Previous HPI 12/01/2021 Raven Haynes is a 72 y.o. female here for follow up for seropositive RA. At our last visit we stopped methotrexate due to concern about contributing to skin rashes and these resolved within 2 weeks of stopping the medication. However within 3-4 weeks she quickly redeveloped joint pain, swelling, and stiffness in both hands and stiffness and difficulty raising both shoulders.   Previous HPI 10/25/21 Raven Haynes is a 73 y.o. female here for follow up for seropositive RA on methotrexate 20 mg p.o. weekly.  She feels her arthritis  symptoms have been very well controlled.  She is concerned about the medicine since she is noticing breaking out with small mildly itchy raised red skin rashes on her left upper arm these appear to be worse for a few days with each dose of methotrexate that she takes.  She does not recall having any similar rashes in the past.   07/19/21 Raven Haynes is a 72 y.o. 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  She feels well without any flare up of arthritis symptoms. She developed skin rash recently along with her husband and seen in urgent care thought to be poison ivy exposure. She took a 6 day tapering course of prednisone with partial improvement. However still has some active red rashes a month later now. Including on her abdomen notices some extension or new spots spreading above the area.   Previous HPI 04/19/2021 Raven Haynes is a 72 y.o. 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 She feels joint symptoms are under control. She is staying very active chasing her grandchildren. She has noticed some worsening left eye vision blurriness when wearing contact lenses, but not when wearing glasses. She denies any pain, redness, or swelling on or around the eye.   Previous HPI 01/18/21 Raven Haynes is a 72  y.o. 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 Last month she suffered fracture to her right 7th and 8th ribs.  Besides this no major worsening of joint pain or swelling. New rash on the face without significant symptoms. No oral ulcers or pain. There is been no recurrence of the previous arm and truncal rash.   Previous HPI   02/17/20 Raven Haynes is a 72 y.o. female here for follow of seropositive RA since starting methotrexate about 6 weeks ago and on prednisone 10mg  daily. She was also seen in the interval by Dr. Junius Roads for right foot injection. She did not have any significant benefit. She was then  recommended to have MRI for further assessment but had trouble getting this with insurance declined. She tolerated decreasing prednisone to 5 mg daily without problems. Her hands feel fine but her foot pain is severely limiting weight bearing activities.   No Rheumatology ROS completed.   PMFS History:  Patient Active Problem List   Diagnosis Date Noted   Subacute cough 04/05/2022   Olecranon bursitis, right elbow 03/13/2022   Ganglion cyst of wrist, left 03/13/2022   Blurred vision, left eye 04/19/2021   Pain in right ankle and joints of right foot 02/17/2020   Rheumatoid arthritis with rheumatoid factor of multiple sites without organ or systems involvement ([place]) 12/30/2019   High risk medication use 12/30/2019   Lesion of nose 05/22/2019   Impaired fasting blood sugar 12/19/2018   Routine general medical examination at a health care facility 11/15/2015   Essential hypertension 03/22/2015    Past Medical History:  Diagnosis Date   Hypertension    Poison ivy dermatitis 07/19/2021   Rheumatoid arthritis ([place])     Family History  Problem Relation Age of Onset   Diabetes Mother    Diabetes Sister    Breast cancer Neg Hx    Past Surgical History:  Procedure Laterality Date   KNEE SURGERY Bilateral 2004,2009   Social History   Social History Narrative   Not on file   Immunization History  Administered Date(s) Administered   Fluad Quad(high Dose 65+) 12/19/2018, 12/04/2019   Influenza, High Dose Seasonal PF 12/10/2016, 12/13/2017   Influenza,inj,Quad PF,6+ Mos 11/15/2015   PFIZER(Purple Top)SARS-COV-2 Vaccination 03/31/2019, 04/18/2019   Pneumococcal Conjugate-13 03/22/2015   Pneumococcal Polysaccharide-23 03/10/2014   Tdap 09/09/2011   Zoster Recombinat (Shingrix) 10/04/2017   Zoster, Live 12/24/2013     Objective: Vital Signs: There were no vitals taken for this visit.   Physical Exam   Musculoskeletal Exam: ***  CDAI Exam: CDAI Score: -- Patient Global: --;  Provider Global: -- Swollen: --; Tender: -- Joint Exam 05/28/2022   No joint exam has been documented for this visit   There is currently no information documented on the homunculus. Go to the Rheumatology activity and complete the homunculus joint exam.  Investigation: No additional findings.  Imaging: No results found.  Recent Labs: Lab Results  Component Value Date   WBC 3.7 (L) 03/13/2022   HGB 12.8 03/13/2022   PLT 229 03/13/2022   NA 143 03/13/2022   K 4.5 03/13/2022   CL 107 03/13/2022   CO2 27 03/13/2022   GLUCOSE 108 (H) 03/13/2022   BUN 13 03/13/2022   CREATININE 0.72 03/13/2022   BILITOT 0.5 03/13/2022   ALKPHOS 65 12/21/2019   AST 23 03/13/2022   ALT 26 03/13/2022   PROT 7.0 03/13/2022   ALBUMIN 4.5 12/21/2019   CALCIUM 9.1 03/13/2022  GFRAA 93 06/22/2020   QFTBGOLDPLUS NEGATIVE 03/13/2022    Speciality Comments: No specialty comments available.  Procedures:  No procedures performed Allergies: Patient has no known allergies.   Assessment / Plan:     Visit Diagnoses: No diagnosis found.  ***  Orders: No orders of the defined types were placed in this encounter.  No orders of the defined types were placed in this encounter.    Follow-Up Instructions: No follow-ups on file.   Collier Salina, MD  Note - This record has been created using [employer].  Chart creation errors have been sought, but may not always  have been located. Such creation errors do not reflect on  the standard of medical care.

## 2022-05-28 ENCOUNTER — Ambulatory Visit: Payer: Medicare HMO | Attending: Internal Medicine | Admitting: Internal Medicine

## 2022-05-28 ENCOUNTER — Encounter: Payer: Self-pay | Admitting: Internal Medicine

## 2022-05-28 VITALS — BP 163/97 | HR 64 | Resp 14 | Ht 62.0 in | Wt 156.0 lb

## 2022-05-28 DIAGNOSIS — Z79899 Other long term (current) drug therapy: Secondary | ICD-10-CM

## 2022-05-28 DIAGNOSIS — M0579 Rheumatoid arthritis with rheumatoid factor of multiple sites without organ or systems involvement: Secondary | ICD-10-CM | POA: Diagnosis not present

## 2022-05-28 DIAGNOSIS — M67432 Ganglion, left wrist: Secondary | ICD-10-CM

## 2022-05-28 DIAGNOSIS — R052 Subacute cough: Secondary | ICD-10-CM

## 2022-05-28 DIAGNOSIS — M7021 Olecranon bursitis, right elbow: Secondary | ICD-10-CM | POA: Diagnosis not present

## 2022-05-28 LAB — COMPLETE METABOLIC PANEL WITH GFR
AG Ratio: 1.7 (calc) (ref 1.0–2.5)
ALT: 16 U/L (ref 6–29)
AST: 19 U/L (ref 10–35)
Albumin: 4.3 g/dL (ref 3.6–5.1)
Alkaline phosphatase (APISO): 54 U/L (ref 37–153)
BUN: 13 mg/dL (ref 7–25)
CO2: 27 mmol/L (ref 20–32)
Calcium: 9.4 mg/dL (ref 8.6–10.4)
Chloride: 105 mmol/L (ref 98–110)
Creat: 0.7 mg/dL (ref 0.60–1.00)
Globulin: 2.6 g/dL (calc) (ref 1.9–3.7)
Glucose, Bld: 93 mg/dL (ref 65–99)
Potassium: 4.2 mmol/L (ref 3.5–5.3)
Sodium: 139 mmol/L (ref 135–146)
Total Bilirubin: 0.4 mg/dL (ref 0.2–1.2)
Total Protein: 6.9 g/dL (ref 6.1–8.1)
eGFR: 92 mL/min/{1.73_m2} (ref >=60)

## 2022-05-28 LAB — CBC WITH DIFFERENTIAL/PLATELET
Absolute Monocytes: 419 cells/uL (ref 200–950)
Basophils Absolute: 20 cells/uL (ref 0–200)
Basophils Relative: 0.6 %
Eosinophils Absolute: 109 cells/uL (ref 15–500)
Eosinophils Relative: 3.3 %
HCT: 37.7 % (ref 35.0–45.0)
Hemoglobin: 12.3 g/dL (ref 11.7–15.5)
Lymphs Abs: 1482 cells/uL (ref 850–3900)
MCH: 28.7 pg (ref 27.0–33.0)
MCHC: 32.6 g/dL (ref 32.0–36.0)
MCV: 88.1 fL (ref 80.0–100.0)
MPV: 10.8 fL (ref 7.5–12.5)
Monocytes Relative: 12.7 %
Neutro Abs: 1271 cells/uL — ABNORMAL LOW (ref 1500–7800)
Neutrophils Relative %: 38.5 %
Platelets: 225 10*3/uL (ref 140–400)
RBC: 4.28 10*6/uL (ref 3.80–5.10)
RDW: 12.9 % (ref 11.0–15.0)
Total Lymphocyte: 44.9 %
WBC: 3.3 10*3/uL — ABNORMAL LOW (ref 3.8–10.8)

## 2022-05-28 LAB — SEDIMENTATION RATE: Sed Rate: 6 mm/h (ref 0–30)

## 2022-05-29 NOTE — Progress Notes (Signed)
Sedimentation rate is normal. White blood cell count is mildly low at 3.3. This is not low enough that we have to change anything but will monitor this. Metabolic panel is normal.

## 2022-06-26 ENCOUNTER — Other Ambulatory Visit: Payer: Self-pay

## 2022-06-26 DIAGNOSIS — M0579 Rheumatoid arthritis with rheumatoid factor of multiple sites without organ or systems involvement: Secondary | ICD-10-CM

## 2022-06-26 DIAGNOSIS — Z79899 Other long term (current) drug therapy: Secondary | ICD-10-CM

## 2022-06-26 NOTE — Telephone Encounter (Signed)
Refill request received via fax from [facility] for Humira.   Last Fill: 04/17/2022  Labs: 05/28/2022 Sedimentation rate is normal. White blood cell count is mildly low at 3.3. This is not low enough that we have to change anything but will monitor this. Metabolic panel is normal.   TB Gold: 03/13/2022 Negative   Next Visit: 08/01/2022  Last Visit: 05/28/2022  QT:[id number] arthritis with rheumatoid factor of multiple sites without organ or systems involvement   Current Dose per office note 05/28/2022: Humira 40 mg subcu q. 14 days.   Okay to refill Humira?

## 2022-06-27 MED ORDER — HUMIRA (2 PEN) 40 MG/0.4ML ~~LOC~~ AJKT: 40.0000 mg | AUTO-INJECTOR | SUBCUTANEOUS | 0 refills | Status: DC

## 2022-07-10 ENCOUNTER — Other Ambulatory Visit: Payer: Self-pay | Admitting: *Deleted

## 2022-07-10 ENCOUNTER — Telehealth: Payer: Self-pay | Admitting: *Deleted

## 2022-07-10 DIAGNOSIS — Z79899 Other long term (current) drug therapy: Secondary | ICD-10-CM

## 2022-07-10 DIAGNOSIS — M0579 Rheumatoid arthritis with rheumatoid factor of multiple sites without organ or systems involvement: Secondary | ICD-10-CM

## 2022-07-10 MED ORDER — HUMIRA (2 PEN) 40 MG/0.4ML ~~LOC~~ AJKT: 40.0000 mg | AUTO-INJECTOR | SUBCUTANEOUS | 0 refills | Status: DC

## 2022-07-10 NOTE — Telephone Encounter (Signed)
Rx was only sent for 3 months with patient assistance application so patient should be due for refill right now. Refill can be escribed to [facility] pharmacy  Chesley Mires, PharmD, MPH, BCPS, CPP Clinical Pharmacist (Rheumatology and Pulmonology)

## 2022-07-10 NOTE — Telephone Encounter (Signed)
RX sent to Dr. Dimple Casey, I called patient.

## 2022-07-10 NOTE — Telephone Encounter (Signed)
Last Fill: 04/17/2022  Labs: 05/28/2022 Sedimentation rate is normal. White blood cell count is mildly low at 3.3. This is not low enough that we have to change anything but will monitor this. Metabolic panel is normal.   TB Gold: 03/13/2022 negative   Next Visit: 08/01/2022  Last Visit: 05/28/2022   [id number] arthritis with rheumatoid factor of multiple sites without organ or systems involvement   Current Dose per office note 05/28/2022: Humira 40 mg subcu q. 14 days.   Okay to refill Humira?

## 2022-07-10 NOTE — Telephone Encounter (Signed)
Please call patient regarding Humira RF. RX states RX refill for Humira faxed with patient assistance form; however, patient states pharmacy did not receive RX.  Thank you.

## 2022-08-01 ENCOUNTER — Ambulatory Visit: Payer: Medicare HMO | Admitting: Internal Medicine

## 2022-08-07 DIAGNOSIS — B349 Viral infection, unspecified: Secondary | ICD-10-CM | POA: Diagnosis not present

## 2022-08-07 DIAGNOSIS — R059 Cough, unspecified: Secondary | ICD-10-CM | POA: Diagnosis not present

## 2022-08-13 ENCOUNTER — Ambulatory Visit: Payer: Medicare HMO | Admitting: Internal Medicine

## 2022-08-31 NOTE — Progress Notes (Unsigned)
Office Visit Note  Patient: Raven Haynes             Date of Birth: 06-11-1950           MRN: [id number]             PCP: Myrlene Broker, MD Referring: Myrlene Broker, * Visit Date: 09/04/2022   Subjective:  No chief complaint on file.   History of Present Illness: Raven Haynes is a 72 y.o. 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 14 days.    Previous HPI 05/28/2022 Raven Haynes is a 72 y.o. 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 14 days medication started back in February about to take her third at home dose.  Since discontinuing the leflunomide she is feeling overall better.  Joint pain and swelling has also improved since starting the Humira per right elbow swelling is almost entirely resolved.  Hand stiffness swelling and grip is also doing better.  Has not seen significant improvement in the left wrist tenderness and swelling.  No injection site reaction or other medication problems so far. She still continues having a persistent nonproductive cough that she has noticed since being sick with viral URI late last year.  Not very productive and not noticing much sinus congestion drainage or seasonal allergy type symptoms.   Previous HPI 03/13/22 Raven Haynes is a 72 y.o. 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  So far she continues to experience some joint swelling in both upper extremities with this treatment.  She is noticing some swelling in her right elbow left wrist and in finger joints on both hands.  She has not noticed any swelling or increased symptoms affecting lower extremities.  She does not feel any specific intolerance to seeing the medication.  However has had a prolonged course with recent upper respiratory illness symptoms lasting for 3 weeks.     Previous HPI 01/08/22  Raven Haynes is a 72 y.o. 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  She is still having significant joint pain, stiffness, and swelling in hands and wrists and pain in knees. Left wrist has a new nodule as well that is swelling up and tender. Morning stiffness about 1-2 hours. She had one episode of severe diarrhea and abdominal pain after starting the medicine but otherwise fine.   Previous HPI 12/01/2021 Raven Haynes is a 72 y.o. female here for follow up for seropositive RA. At our last visit we stopped methotrexate due to concern about contributing to skin rashes and these resolved within 2 weeks of stopping the medication. However within 3-4 weeks she quickly redeveloped joint pain, swelling, and stiffness in both hands and stiffness and difficulty raising both shoulders.   Previous HPI 10/25/21 Raven Haynes is a 72 y.o. female here for follow up for seropositive RA on methotrexate 20 mg p.o. weekly.  She feels her arthritis symptoms have been very well controlled.  She is concerned about the medicine since she is noticing breaking out with small mildly itchy raised red skin rashes on her left upper arm these appear to be worse for a few days with each dose of methotrexate that she takes.  She does not recall having any similar rashes in the past.   07/19/21 Raven Haynes is a 72 y.o. female here for follow up for seropositive RA on methotrexate  20 mg PO weekly.  She feels well without any flare up of arthritis symptoms. She developed skin rash recently along with her husband and seen in urgent care thought to be poison ivy exposure. She took a 6 day tapering course of prednisone with partial improvement. However still has some active red rashes a month later now. Including on her abdomen notices some extension or new spots spreading above the area.   Previous HPI 04/19/2021 Raven Haynes is a 72 y.o. 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 She feels joint symptoms are under control. She is staying very active chasing her  grandchildren. She has noticed some worsening left eye vision blurriness when wearing contact lenses, but not when wearing glasses. She denies any pain, redness, or swelling on or around the eye.   Previous HPI 01/18/21 Raven Haynes is a 72 y.o. 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 Last month she suffered fracture to her right 7th and 8th ribs.  Besides this no major worsening of joint pain or swelling. New rash on the face without significant symptoms. No oral ulcers or pain. There is been no recurrence of the previous arm and truncal rash.   Previous HPI   02/17/20 Raven Haynes is a 72 y.o. female here for follow of seropositive RA since starting methotrexate about 6 weeks ago and on prednisone 10mg  daily. She was also seen in the interval by Dr. Prince Rome for right foot injection. She did not have any significant benefit. She was then recommended to have MRI for further assessment but had trouble getting this with insurance declined. She tolerated decreasing prednisone to 5 mg daily without problems. Her hands feel fine but her foot pain is severely limiting weight bearing activities.   No Rheumatology ROS completed.   PMFS History:  Patient Active Problem List   Diagnosis Date Noted   Subacute cough 04/05/2022   Olecranon bursitis, right elbow 03/13/2022   Ganglion cyst of wrist, left 03/13/2022   Blurred vision, left eye 04/19/2021   Pain in right ankle and joints of right foot 02/17/2020   Rheumatoid arthritis with rheumatoid factor of multiple sites without organ or systems involvement (HCC) 12/30/2019   High risk medication use 12/30/2019   Lesion of nose 05/22/2019   Impaired fasting blood sugar 12/19/2018   Routine general medical examination at a health care facility 11/15/2015   Essential hypertension 03/22/2015    Past Medical History:  Diagnosis Date   Hypertension    Poison ivy dermatitis 07/19/2021   Rheumatoid arthritis (HCC)      Family History  Problem Relation Age of Onset   Diabetes Mother    Diabetes Sister    Breast cancer Neg Hx    Past Surgical History:  Procedure Laterality Date   KNEE SURGERY Bilateral 2004,2009   Social History   Social History Narrative   Not on file   Immunization History  Administered Date(s) Administered   Fluad Quad(high Dose 65+) 12/19/2018, 12/04/2019   Influenza, High Dose Seasonal PF 12/10/2016, 12/13/2017   Influenza,inj,Quad PF,6+ Mos 11/15/2015   PFIZER(Purple Top)SARS-COV-2 Vaccination 03/31/2019, 04/18/2019   Pneumococcal Conjugate-13 03/22/2015   Pneumococcal Polysaccharide-23 03/10/2014   Tdap 09/09/2011   Zoster Recombinat (Shingrix) 10/04/2017   Zoster, Live 12/24/2013     Objective: Vital Signs: There were no vitals taken for this visit.   Physical Exam   Musculoskeletal Exam: ***  CDAI Exam: CDAI Score: --  Patient Global: --; Provider Global: -- Swollen: --; Tender: -- Joint Exam 09/04/2022   No joint exam has been documented for this visit   There is currently no information documented on the homunculus. Go to the Rheumatology activity and complete the homunculus joint exam.  Investigation: No additional findings.  Imaging: No results found.  Recent Labs: Lab Results  Component Value Date   WBC 3.3 (L) 05/28/2022   HGB 12.3 05/28/2022   PLT 225 05/28/2022   NA 139 05/28/2022   K 4.2 05/28/2022   CL 105 05/28/2022   CO2 27 05/28/2022   GLUCOSE 93 05/28/2022   BUN 13 05/28/2022   CREATININE 0.70 05/28/2022   BILITOT 0.4 05/28/2022   ALKPHOS 65 12/21/2019   AST 19 05/28/2022   ALT 16 05/28/2022   PROT 6.9 05/28/2022   ALBUMIN 4.5 12/21/2019   CALCIUM 9.4 05/28/2022   GFRAA 93 06/22/2020   QFTBGOLDPLUS NEGATIVE 03/13/2022    Speciality Comments: No specialty comments available.  Procedures:  No procedures performed Allergies: Patient has no known allergies.   Assessment / Plan:     Visit Diagnoses: No diagnosis  found.  ***  Orders: No orders of the defined types were placed in this encounter.  No orders of the defined types were placed in this encounter.    Follow-Up Instructions: No follow-ups on file.   Ellen Henri, CMA  Note - This record has been created using Animal nutritionist.  Chart creation errors have been sought, but may not always  have been located. Such creation errors do not reflect on  the standard of medical care.

## 2022-09-04 ENCOUNTER — Encounter: Payer: Self-pay | Admitting: Internal Medicine

## 2022-09-04 ENCOUNTER — Telehealth: Payer: Self-pay | Admitting: Pharmacist

## 2022-09-04 ENCOUNTER — Ambulatory Visit: Payer: Medicare HMO | Attending: Internal Medicine | Admitting: Internal Medicine

## 2022-09-04 VITALS — BP 166/92 | HR 70 | Resp 12 | Ht 62.0 in | Wt 160.0 lb

## 2022-09-04 DIAGNOSIS — Z79899 Other long term (current) drug therapy: Secondary | ICD-10-CM | POA: Diagnosis not present

## 2022-09-04 DIAGNOSIS — R052 Subacute cough: Secondary | ICD-10-CM

## 2022-09-04 DIAGNOSIS — M7021 Olecranon bursitis, right elbow: Secondary | ICD-10-CM

## 2022-09-04 DIAGNOSIS — M0579 Rheumatoid arthritis with rheumatoid factor of multiple sites without organ or systems involvement: Secondary | ICD-10-CM | POA: Diagnosis not present

## 2022-09-04 DIAGNOSIS — M67432 Ganglion, left wrist: Secondary | ICD-10-CM | POA: Diagnosis not present

## 2022-09-04 DIAGNOSIS — R21 Rash and other nonspecific skin eruption: Secondary | ICD-10-CM

## 2022-09-04 NOTE — Patient Instructions (Signed)
Please complete and drop off Enbrel application as soon as possible.  Etanercept Injection What is this medication? ETANERCEPT (et a [employer]) treats autoimmune conditions, such as psoriasis and certain types of arthritis. It works by slowing down an overactive immune system. It belongs to a group of medications called TNF inhibitors. This medicine may be used for other purposes; ask your health care provider or pharmacist if you have questions. COMMON BRAND NAME(S): Enbrel What should I tell my care team before I take this medication? They need to know if you have any of these conditions: Bleeding disorder Cancer Diabetes Granulomatosis with polyangiitis Heart failure HIV or AIDS Immune system problems Infection, such as tuberculosis (TB) or other bacterial, fungal or viral infections Liver disease Nervous system problems, such as Guillain-Barre syndrome, multiple sclerosis or seizures Recent or upcoming vaccine An unusual or allergic reaction to etanercept, other medications, food, dyes, or preservatives Pregnant or trying to get pregnant Breastfeeding How should I use this medication? The medication is injected under the skin. You will be taught how to prepare and give it. Take it as directed on the prescription label. Keep taking it unless your care team tells you stop. This medication comes with INSTRUCTIONS FOR USE. Ask your pharmacist for directions on how to use this medication. Read the information carefully. Talk to your pharmacist or care team if you have questions. If you use a pen, be sure to take off the outer needle cover before using the dose. It is important that you put your used needles and syringes in a special sharps container. Do not put them in a trash can. If you do not have a sharps container, call your pharmacist or care team to get one. A special MedGuide will be given to you by the pharmacist with each prescription and refill. Be sure to read this information  carefully each time. Talk to your care team about the use of this medication in children. While it may be prescribed for children as young as 9 years of age for selected conditions, precautions do apply. Overdosage: If you think you have taken too much of this medicine contact a poison control center or emergency room at once. NOTE: This medicine is only for you. Do not share this medicine with others. What if I miss a dose? If you miss a dose, take it as soon as you can. If it is almost time for your next dose, take only that dose. Do not take double or extra doses. What may interact with this medication? Do not take this medication with any of the following: Biologic medications, such as adalimumab, certolizumab, golimumab, infliximab Live vaccines Rilonacept This medication may also interact with the following: Abatacept [place]akinra Biologic medications, such as anifrolumab, baricitinib, belimumab, canakinumab, natalizumab, rituximab, sarilumab, tocilizumab, tofacitinib, upadacitinib, vedolizumab Cyclophosphamide Sulfasalazine This list may not describe all possible interactions. Give your health care provider a list of all the medicines, herbs, non-prescription drugs, or dietary supplements you use. Also tell them if you smoke, drink alcohol, or use illegal drugs. Some items may interact with your medicine. What should I watch for while using this medication? Visit your care team for regular checks on your progress. Tell your care team if your symptoms do not start to get better or if they get worse. This medication may increase your risk of getting an infection. Call your care team for advice if you get a fever, chills, sore throat, or other symptoms of a cold or flu. Do not  treat yourself. Try to avoid being around people who are sick. If you have not had the measles or chickenpox vaccines, tell your care team right away if you are around someone with these viruses. You will be tested for  tuberculosis (TB) before you start this medication. If your care team prescribes any medication for TB, you should start taking the TB medication before starting this medication. Make sure to finish the full course of TB medication. Avoid taking medications that contain aspirin, acetaminophen, ibuprofen, naproxen, or ketoprofen unless instructed by your care team. These medications may hide fever. Talk to your care team about your risk of cancer. You may be more at risk for certain types of cancer if you take this medication. This medication can decrease the response to a vaccine. If you need to get vaccinated, tell your care team if you have received this medication. Extra booster doses may be needed. Talk to your care team to see if a different vaccination schedule is needed. What side effects may I notice from receiving this medication? Side effects that you should report to your care team as soon as possible: Allergic reactions--skin rash, itching, hives, swelling of the face, lips, tongue, or throat Body pain, tingling, or numbness Eye pain, change in vision, vision loss Heart failure--shortness of breath, swelling of the ankles, feet, or hands, sudden weight gain, unusual weakness or fatigue Infection--fever, chills, cough, sore throat, wounds that don't heal, pain or trouble when passing urine, general feeling of discomfort or being unwell Liver injury--right upper belly pain, loss of appetite, nausea, light-colored stool, dark yellow or brown urine, yellowing skin or eyes, unusual weakness or fatigue Low red blood cell level--unusual weakness or fatigue, dizziness, headache, trouble breathing Lupus-like syndrome--joint pain, swelling, or stiffness, butterfly-shaped rash on the face, rashes that get worse in the sun, fever, unusual weakness or fatigue New or worsening psoriasis--rash with itchy, scaly patches Seizures Unusual bruising or bleeding Weakness in arms and legs Side effects that  usually do not require medical attention (report to your care team if they continue or are bothersome): Headache Pain, redness, or irritation at injection site Sinus pain or pressure around the face or forehead This list may not describe all possible side effects. Call your doctor for medical advice about side effects. You may report side effects to FDA at 1-800-FDA-1088. Where should I keep my medication? Keep out of the reach of children and pets. See product for storage information. Each product may have different instructions. Get rid of any unused medication after the expiration date. To get rid of medications that are no longer needed or have expired: Take the medication to a medication take-back program. Check with your pharmacy or law enforcement to find a location. If you cannot return the medication, ask your pharmacist or care team how to get rid of this medication safely. NOTE: This sheet is a summary. It may not cover all possible information. If you have questions about this medicine, talk to your doctor, pharmacist, or health care provider.  2024 Elsevier/Gold Standard (2021-08-23 00:00:00)

## 2022-09-04 NOTE — Progress Notes (Signed)
Pharmacy Note  Subjective: Patient presents today to the [facility] for follow up office visit.  Patient seen by the pharmacist for counseling on Enbrel for rheumatoid arthritis. Has developed mild but worsening rash to Humira which persistent for 4-5 days after injection date.  Diagnosis of heart failure: No  Objective:  CBC    Component Value Date/Time   WBC 3.3 (L) 05/28/2022 1206   RBC 4.28 05/28/2022 1206   HGB 12.3 05/28/2022 1206   HCT 37.7 05/28/2022 1206   PLT 225 05/28/2022 1206   MCV 88.1 05/28/2022 1206   MCH 28.7 05/28/2022 1206   MCHC 32.6 05/28/2022 1206   RDW 12.9 05/28/2022 1206   LYMPHSABS 1,482 05/28/2022 1206   MONOABS 0.4 12/21/2019 1440   EOSABS 109 05/28/2022 1206   BASOSABS 20 05/28/2022 1206     CMP     Component Value Date/Time   NA 139 05/28/2022 1206   K 4.2 05/28/2022 1206   CL 105 05/28/2022 1206   CO2 27 05/28/2022 1206   GLUCOSE 93 05/28/2022 1206   BUN 13 05/28/2022 1206   CREATININE 0.70 05/28/2022 1206   CALCIUM 9.4 05/28/2022 1206   PROT 6.9 05/28/2022 1206   ALBUMIN 4.5 12/21/2019 1440   AST 19 05/28/2022 1206   ALT 16 05/28/2022 1206   ALKPHOS 65 12/21/2019 1440   BILITOT 0.4 05/28/2022 1206   GFRNONAA 80 06/22/2020 1422   GFRAA 93 06/22/2020 1422     Baseline Immunosuppressant Therapy Labs TB GOLD    Latest Ref Rng & Units 03/13/2022    3:26 PM  Quantiferon TB Gold  Quantiferon TB Gold Plus NEGATIVE NEGATIVE    Hepatitis Panel    Latest Ref Rng & Units 12/30/2019    9:43 AM  Hepatitis  Hep B Surface Ag NON-REACTI NON-REACTIVE   Hep B IgM NON-REACTI NON-REACTIVE   Hep C Ab NON-REACTI NON-REACTIVE    HIV No results found for: "HIV" Immunoglobulins   SPEP    Latest Ref Rng & Units 05/28/2022   12:06 PM  Serum Protein Electrophoresis  Total Protein 6.1 - 8.1 g/dL 6.9    Chest x-ray: 1/61/09 - No active cardiopulmonary disease.  Assessment/Plan:  Counseled patient that Enbrel is a TNF blocking  agent.  Counseled patient on purpose, proper use, and adverse effects of Enbrel.  Reviewed the most common adverse effects including infections, headache, and injection site reactions.  Discussed that there is the possibility of an increased risk of malignancy including non-melanoma skin cancer but it is not well understood if this increased risk is due to the medication or the disease state.  Advised patient to get yearly dermatology exams due to risk of skin cancer.  Counseled patient that Enbrel should be held prior to scheduled surgery.  Counseled patient to avoid live vaccines while on Enbrel.  Recommend annual influenza, PCV 15 or PCV20 or Pneumovax 23, and Shingrix as indicated.  Reviewed the importance of regular labs while on Enbrel therapy.  Will monitor CBC and CMP 1 month after starting and then every 3 months routinely thereafter. Will monitor TB gold annually. Standing orders placed. Provided patient with medication education material and answered all questions.  Patient consented to Enbrel.  Will upload consent into the media tab.  Reviewed storage instructions for Enbrel.  Dr. Dimple Casey and patient are comfortable with starting Enbrel at home. She was trained on Enbrel Sureclick device today in office and expressed confidence with device.   Dermatology referral was placed in  February 2024 when she starting HUmira  She will plan to complete Humira dose today as processing of Enbrel will take several weeks.   Enbrel patient assistance application was handed to patient. She will work with her husband to complete. She is aware that I am OOO next week. Provider portion signed by Dr. Dimple Casey today. Will retain in Onbase for submission while OOO if needed  Chesley Mires, PharmD, MPH, BCPS, CPP Clinical Pharmacist (Rheumatology and Pulmonology)

## 2022-09-04 NOTE — Telephone Encounter (Signed)
Pending OV note from today, please start ENBREL SURECLICK BIV.  Patient will complete her portion of PAP application and  Will complete Enbrel application at home and return to clinic ASAP. Provider portion cmpleted today by Dr. Dimple Casey and will retain in Onbase for submission when needed  Patient plans to complete Humira dose today since it will take several weeks to get Enbrel approved. Patient will not need new start visit  Chesley Mires, PharmD, MPH, BCPS, CPP Clinical Pharmacist (Rheumatology and Pulmonology)

## 2022-09-05 ENCOUNTER — Other Ambulatory Visit (HOSPITAL_COMMUNITY): Payer: Self-pay

## 2022-09-05 NOTE — Telephone Encounter (Signed)
Received notification from [facility] regarding a prior authorization for ENBREL. Authorization has been APPROVED from 09/05/22 to 03/12/23. Approval letter sent to scan center.  Per test claim, copay for 28 days supply is $2223.17  Patient can fill through [facility]: [phone]   Phone # [phone]  PAP submission is pending pt forms of application to be returned.  Chesley Mires, PharmD, MPH, BCPS, CPP Clinical Pharmacist (Rheumatology and Pulmonology)

## 2022-09-05 NOTE — Telephone Encounter (Signed)
Submitted a Prior Authorization request to [facility] for ENBREL via CoverMyMeds. Will update once we receive a response.  Key: Pandora Leiter, PharmD, MPH, BCPS, CPP Clinical Pharmacist (Rheumatology and Pulmonology)

## 2022-09-06 ENCOUNTER — Telehealth: Payer: Self-pay | Admitting: Pharmacist

## 2022-09-06 ENCOUNTER — Other Ambulatory Visit (HOSPITAL_COMMUNITY): Payer: Self-pay

## 2022-09-06 NOTE — Telephone Encounter (Signed)
Submitted a Prior Authorization request to [facility] for [facility] via CoverMyMeds. Will update once we receive a response.  Key: [id number]  Received signed patient forms for Simponi PAP. PAP pending provider form. Placed in Dr. Gregary Cromer folder for signature.  Chesley Mires, PharmD, MPH, BCPS, CPP Clinical Pharmacist (Rheumatology and Pulmonology)

## 2022-09-06 NOTE — Telephone Encounter (Signed)
Patient's husband called and stated that they are significantly above income threshold for household of 2 for Enbrel patient assistance application. Discussed Simponi SQ as an alternative with Dr. Dimple Casey - he states this is okay.  Chesley Mires, PharmD, MPH, BCPS, CPP Clinical Pharmacist (Rheumatology and Pulmonology)

## 2022-09-06 NOTE — Telephone Encounter (Signed)
Patient will trial Simponi SQ (she is not on MTX) as she does not qualify for Enbrel PAP program. Patient will stop by to sign PAP forms today. Proivder portion placed with Dr. Gregary Cromer folder for signature  Please start PA for Simponi SQ through insurance  Dose: 50mg  SQ every 28 days  Chesley Mires, PharmD, MPH, BCPS, CPP Clinical Pharmacist (Rheumatology and Pulmonology)

## 2022-09-06 NOTE — Telephone Encounter (Signed)
Received notification from [facility] regarding a prior authorization for [facility]. Authorization has been APPROVED from 09/06/22 to 03/12/23. Approval letter sent to scan center.  Per test claim, copay for 28 days supply is $343.55  Submitted Patient Assistance Application to  [employer]  for [facility] along with provider portion, patient portion, med list, insurance card copy, PA. Will update patient when we receive a response.  Phone: [phone] Fax: [phone]  Chesley Mires, PharmD, MPH, BCPS, CPP Clinical Pharmacist (Rheumatology and Pulmonology)

## 2022-09-07 ENCOUNTER — Telehealth: Payer: Self-pay

## 2022-09-07 LAB — C-REACTIVE PROTEIN: CRP: <3 mg/L (ref <8.0)

## 2022-09-07 LAB — CBC WITH DIFFERENTIAL/PLATELET
Absolute Monocytes: 296 cells/uL (ref 200–950)
Basophils Absolute: 20 cells/uL (ref 0–200)
Basophils Relative: 0.5 %
Eosinophils Absolute: 228 cells/uL (ref 15–500)
Eosinophils Relative: 5.7 %
HCT: 37.5 % (ref 35.0–45.0)
Hemoglobin: 12.5 g/dL (ref 11.7–15.5)
Lymphs Abs: 1744 cells/uL (ref 850–3900)
MCH: 29.2 pg (ref 27.0–33.0)
MCHC: 33.3 g/dL (ref 32.0–36.0)
MCV: 87.6 fL (ref 80.0–100.0)
MPV: 10.4 fL (ref 7.5–12.5)
Monocytes Relative: 7.4 %
Neutro Abs: 1712 cells/uL (ref 1500–7800)
Neutrophils Relative %: 42.8 %
Platelets: 254 10*3/uL (ref 140–400)
RBC: 4.28 10*6/uL (ref 3.80–5.10)
RDW: 12.6 % (ref 11.0–15.0)
Total Lymphocyte: 43.6 %
WBC: 4 10*3/uL (ref 3.8–10.8)

## 2022-09-07 LAB — COMPLETE METABOLIC PANEL WITH GFR
AG Ratio: 1.7 (calc) (ref 1.0–2.5)
ALT: 14 U/L (ref 6–29)
AST: 18 U/L (ref 10–35)
Albumin: 4.4 g/dL (ref 3.6–5.1)
Alkaline phosphatase (APISO): 55 U/L (ref 37–153)
BUN: 13 mg/dL (ref 7–25)
CO2: 27 mmol/L (ref 20–32)
Calcium: 9.5 mg/dL (ref 8.6–10.4)
Chloride: 107 mmol/L (ref 98–110)
Creat: 0.7 mg/dL (ref 0.60–1.00)
Globulin: 2.6 g/dL (calc) (ref 1.9–3.7)
Glucose, Bld: 95 mg/dL (ref 65–99)
Potassium: 4.1 mmol/L (ref 3.5–5.3)
Sodium: 140 mmol/L (ref 135–146)
Total Bilirubin: 0.4 mg/dL (ref 0.2–1.2)
Total Protein: 7 g/dL (ref 6.1–8.1)
eGFR: 91 mL/min/{1.73_m2} (ref >=60)

## 2022-09-07 LAB — ANA: Anti Nuclear Antibody (ANA): POSITIVE — AB

## 2022-09-07 LAB — ANTI-DNA ANTIBODY, DOUBLE-STRANDED: ds DNA Ab: 1 IU/mL

## 2022-09-07 LAB — SEDIMENTATION RATE: Sed Rate: 14 mm/h (ref 0–30)

## 2022-09-07 LAB — ANTI-NUCLEAR AB-TITER (ANA TITER): ANA Titer 1: 1:40 {titer} — ABNORMAL HIGH

## 2022-09-07 NOTE — Telephone Encounter (Signed)
See lab note for details.  

## 2022-09-07 NOTE — Progress Notes (Signed)
Lab results look good her sedimentation rate and CRP are normal.  Blood count metabolic panel normal no problem from the Humira.  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  Recommending to go ahead switching from the Humira to Simponi.

## 2022-09-07 NOTE — Telephone Encounter (Signed)
Patient returned call regarding lab results and requested a call back.    Please advise.

## 2022-09-17 ENCOUNTER — Telehealth: Payer: Self-pay

## 2022-09-17 DIAGNOSIS — H04123 Dry eye syndrome of bilateral lacrimal glands: Secondary | ICD-10-CM | POA: Diagnosis not present

## 2022-09-17 DIAGNOSIS — H5203 Hypermetropia, bilateral: Secondary | ICD-10-CM | POA: Diagnosis not present

## 2022-09-17 DIAGNOSIS — H2513 Age-related nuclear cataract, bilateral: Secondary | ICD-10-CM | POA: Diagnosis not present

## 2022-09-17 NOTE — Telephone Encounter (Signed)
Patient contacted the office and left a message stating she would like to know when her medication will be ready. Contacted the patient and patient states she would like an update on the Simponi. Patient call back number is [phone]. Please advise.

## 2022-09-17 NOTE — Telephone Encounter (Signed)
Patient contacted the office and states that she would like more explanation about her abnormal lab results, which were the Anti-Nuclear ab-titer test and the ANA test. Patient inquires if she needs to come in for more testing or is it a side effect from something.   Patient also states she is supposed to take her Humira tomorrow but she does not like the way she feels when she takes it and she has reactions to it. Patient inquired if she should take the Humira tomorrow or not and what should she do. Patient call back number is [phone]. Please advise.

## 2022-09-19 ENCOUNTER — Telehealth: Payer: Self-pay | Admitting: Internal Medicine

## 2022-09-19 NOTE — Telephone Encounter (Signed)
Patient advised that's fine if she wants to go ahead and stop Humira. If she does experience a flare up of symptoms during interruption of treatment we could call in a steroid prescription as needed. Patient expressed understanding.

## 2022-09-19 NOTE — Telephone Encounter (Signed)
Returned call to patient and advised that [employer] will be reaching out to her to complete soft credit check consent. Turnaround from there is 24-48 hours. She does not want to take another dose of Humira   Raven Haynes, PharmD, MPH, BCPS, CPP Clinical Pharmacist (Rheumatology and Pulmonology)     

## 2022-09-19 NOTE — Telephone Encounter (Signed)
That's fine if she wants to go ahead and stop Humira. If she does experience a flare up of symptoms during interruption of treatment we could call in a steroid prescription as needed.

## 2022-09-19 NOTE — Telephone Encounter (Signed)
Patient has been advised of lab results from 09/04/2022. Please refer to my note sent on 09/17/2022 regarding further questions the patient has regarding her lab results. Please advise.   Patient would like an update on her Simponi. Patient call back number is [phone]. Please advise.

## 2022-09-19 NOTE — Telephone Encounter (Signed)
Returned call to patient and advised that [employer] will be reaching out to her to complete soft credit check consent. Turnaround from there is 24-48 hours. She does not want to take another dose of Humira   Chesley Mires, PharmD, MPH, BCPS, CPP Clinical Pharmacist (Rheumatology and Pulmonology)

## 2022-09-19 NOTE — Telephone Encounter (Signed)
Patient called checking the status of her Simponi medication.  Patient states she was due to take her injection yesterday, 09/18/22.    Patient also requested a return call regarding her labwork results.  Patient states she has additional questions.

## 2022-09-19 NOTE — Telephone Encounter (Signed)
Called [employer] to get update on status of PAP application for Simponi. Was informed that [employer] needs to do a soft credit check but they have everything else they need for the application. They will reach out to the patient within 24-48 hours to obtain approval for the soft credit check.

## 2022-09-24 ENCOUNTER — Telehealth: Payer: Self-pay

## 2022-09-24 NOTE — Telephone Encounter (Signed)
Received a fax refill request for the patient's Humira from [employer]. Contacted the pharmacy and spoke to Thornton and advised the patient is no longer taking Humira. Liborio Nixon verbalized understanding. Humira discontinued per Dr. Dimple Casey.

## 2022-09-26 NOTE — Telephone Encounter (Signed)
Per Linwood Dibbles, patient's application is in review. They are requiring proof of household income and OOP expenditures. Spoke with patient and husband. They will collect docs and drop off at the clinic  Chesley Mires, PharmD, MPH, BCPS, CPP Clinical Pharmacist (Rheumatology and Pulmonology)

## 2022-09-27 NOTE — Telephone Encounter (Signed)
Received income docs from patient and husband. Faxed to [employer] today.  Phone: [phone] Fax: [phone]

## 2022-10-03 NOTE — Telephone Encounter (Signed)
Attempted to call Linwood Dibbles for update on PAP application status. Was told the application is in the "escalations" department and the rep could not view it. Rep said she would message the team and have someone call us with an update on the application.

## 2022-10-09 NOTE — Telephone Encounter (Signed)
Was told the application is (still) in review with the escalations team. Rep changed the priority of the application from normal to critical, so hopefully we should finally get an answer soon. Rep will message dedicated case manager below about the application.  Dedicated case manager for this case: Damon ext. [phone]

## 2022-10-16 NOTE — Telephone Encounter (Signed)
Called [employer] for Simponi SQ PAP application update. Rep is AGAIN placing on escalation team. Pharmacy team to f/u in 48 hours  Phone: [phone]  Chesley Mires, PharmD, MPH, BCPS, CPP Clinical Pharmacist (Rheumatology and Pulmonology)

## 2022-10-23 ENCOUNTER — Telehealth: Payer: Self-pay | Admitting: *Deleted

## 2022-10-23 NOTE — Telephone Encounter (Signed)
Patient returned call to the office. Patient requesting a call back.

## 2022-10-23 NOTE — Telephone Encounter (Addendum)
Contacted [employer] for Simponi SQ application for an update. Rep Duwayne Heck) informed me that despite the application being moved to the escalation team last week the decision is still pending. The rep will route the application to the leadership team to hopefully speed up the response. She encouraged [place] to follow up in 48 hours.   Left voicemail with patient to inform her of where we are processing her application.  Sofie Rower, PharmD, [employer] PGY1

## 2022-10-24 NOTE — Telephone Encounter (Signed)
Spoke with patient requesting she personally follow up with [employer] Dibbles regarding expediting application., Patient states that she received an email a couple of weeks ago stating she was approved (??) - she states she has not been having arthritis symptoms and is deferring on not taking any medications since she had side effects to Humira. She states her injection reactions are still healing and may wait to start anything new until this has been healed  She has f/u appt on 10/26/22 with Dr. Dimple Casey and would like to further discuss at this OV  Chesley Mires, PharmD, MPH, BCPS, CPP Clinical Pharmacist (Rheumatology and Pulmonology)

## 2022-10-25 ENCOUNTER — Telehealth: Payer: Self-pay | Admitting: *Deleted

## 2022-10-25 NOTE — Telephone Encounter (Signed)
Patient has OV tomorrow anf wanted to discuss if she eneded to take Simponi at all with Dr. Dimple Casey. She states that her arthritis is relatively doing well  Chesley Mires, PharmD, MPH, BCPS, CPP Clinical Pharmacist (Rheumatology and Pulmonology)

## 2022-10-25 NOTE — Telephone Encounter (Signed)
[employer] patient support called to follow up regarding the patient's application. They have reached out multiple times to the patient without any success. Frederich Balding requesting a call back at [phone] ext [phone].

## 2022-10-25 NOTE — Progress Notes (Signed)
Office Visit Note  Patient: Raven Haynes             Date of Birth: 04/12/1950           MRN: [id number]             PCP: Myrlene Broker, MD Referring: Myrlene Broker, * Visit Date: 10/26/2022   Subjective:  Follow-up   History of Present Illness: Raven Haynes is a 72 y.o. female here for follow up for seropositive RA currently off treatment she discontinued Humira after our last clinic visit.  Lab test at that time demonstrated very low positive ANA was negative for dsDNA.  She took 1 additional dose after that visit had a large local injection site reaction with itching and redness as well as flaring up the rashes all over.  Since stopping Humira she initially took Tylenol at first 3 times a day and then tapered off that completely.  At this time she is not on any more medicine and does not have any significant joint pain or stiffness.  Previous HPI 09/04/22 Raven Haynes is a 72 y.o. 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 14 days.  Joint inflammation is doing well has only brief morning stiffness daily. She does not see visible swelling in any joints. The left wrist cyst partially decreased in size although still present. She had a viral URI last month treated with cough medication and somewhat slow to resolve. Still has a persistent, nonproductive cough with some throat irritation but this was present before the cold. Has rashes after the past 4 Humira injections getting somewhat worse over time. At first just an erythematous area around the injection site at the thigh. Now having rash with small raised bumps on the neck, behind ears, and on arms lasting for 4-5 days. Has tried taking benadryl and putting cold compress on affected site without resolution.   Previous HPI 01/08/22 Raven Haynes is a 72 y.o. 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 She is still having significant joint pain, stiffness, and swelling in  hands and wrists and pain in knees. Left wrist has a new nodule as well that is swelling up and tender. Morning stiffness about 1-2 hours. She had one episode of severe diarrhea and abdominal pain after starting the medicine but otherwise fine.   Previous HPI   02/17/20 Raven Haynes is a 72 y.o. female here for follow of seropositive RA since starting methotrexate about 6 weeks ago and on prednisone 10mg  daily. She was also seen in the interval by Dr. Prince Rome for right foot injection. She did not have any significant benefit. She was then recommended to have MRI for further assessment but had trouble getting this with insurance declined. She tolerated decreasing prednisone to 5 mg daily without problems. Her hands feel fine but her foot pain is severely limiting weight bearing activities.   Review of Systems  Constitutional:  Positive for fatigue.  HENT:  Negative for mouth sores and mouth dryness.   Eyes:  Positive for dryness.  Respiratory:  Negative for shortness of breath.   Cardiovascular:  Positive for chest pain. Negative for palpitations.  Gastrointestinal:  Negative for blood in stool, constipation and diarrhea.  Endocrine: Negative for increased urination.  Genitourinary:  Negative for involuntary urination.  Musculoskeletal:  Positive for morning stiffness. Negative for joint pain, gait problem, joint pain, joint swelling, myalgias, muscle weakness, muscle tenderness and myalgias.  Skin:  Positive for rash, hair loss and sensitivity to sunlight. Negative for color change.  Allergic/Immunologic: Negative for susceptible to infections.  Neurological:  Negative for dizziness and headaches.  Hematological:  Negative for swollen glands.  Psychiatric/Behavioral:  Negative for depressed mood and sleep disturbance. The patient is not nervous/anxious.     PMFS History:  Patient Active Problem List   Diagnosis Date Noted   Subacute cough 04/05/2022   Ganglion cyst of wrist, left 03/13/2022    Blurred vision, left eye 04/19/2021   Rash and other nonspecific skin eruption 10/18/2020   Pain in right ankle and joints of right foot 02/17/2020   Rheumatoid arthritis with rheumatoid factor of multiple sites without organ or systems involvement (HCC) 12/30/2019   High risk medication use 12/30/2019   Lesion of nose 05/22/2019   Impaired fasting blood sugar 12/19/2018   Routine general medical examination at a health care facility 11/15/2015   Essential hypertension 03/22/2015    Past Medical History:  Diagnosis Date   Hypertension    Olecranon bursitis, right elbow 03/13/2022   Poison ivy dermatitis 07/19/2021   Rheumatoid arthritis (HCC)     Family History  Problem Relation Age of Onset   Diabetes Mother    Diabetes Sister    Breast cancer Neg Hx    Past Surgical History:  Procedure Laterality Date   KNEE SURGERY Bilateral 2004,2009   Social History   Social History Narrative   Not on file   Immunization History  Administered Date(s) Administered   Fluad Quad(high Dose 65+) 12/19/2018, 12/04/2019   Influenza, High Dose Seasonal PF 12/10/2016, 12/13/2017   Influenza,inj,Quad PF,6+ Mos 11/15/2015   PFIZER(Purple Top)SARS-COV-2 Vaccination 03/31/2019, 04/18/2019   Pneumococcal Conjugate-13 03/22/2015   Pneumococcal Polysaccharide-23 03/10/2014   Tdap 09/09/2011   Zoster Recombinant(Shingrix) 10/04/2017   Zoster, Live 12/24/2013     Objective: Vital Signs: BP (!) 151/83 (BP Location: Left Arm, Patient Position: Sitting, Cuff Size: Normal)   Pulse 73   Resp 12   Ht 5\' 2"  (1.575 m)   Wt 164 lb (74.4 kg)   BMI 30.00 kg/m    Physical Exam Eyes:     Conjunctiva/sclera: Conjunctivae normal.  Cardiovascular:     Rate and Rhythm: Normal rate and regular rhythm.  Pulmonary:     Effort: Pulmonary effort is normal.     Breath sounds: Normal breath sounds.  Musculoskeletal:     Right lower leg: No edema.     Left lower leg: No edema.  Lymphadenopathy:      Cervical: No cervical adenopathy.  Skin:    General: Skin is warm and dry.     Findings: No rash.  Neurological:     Mental Status: She is alert.  Psychiatric:        Mood and Affect: Mood normal.      Musculoskeletal Exam:  Shoulders full ROM no tenderness or swelling Elbows full ROM no tenderness or swelling Wrists full ROM no tenderness, left wrist small nontender cyst on radial side Fingers full ROM chronic MCP joint thickening on both hands, no tenderness or palpable effusions Knees full ROM patellofemoral crepitus worse on right knee, no tenderness or effusions Ankles full ROM no tenderness or swelling  Investigation: No additional findings.  Imaging: No results found.  Recent Labs: Lab Results  Component Value Date   WBC 4.0 09/04/2022   HGB 12.5 09/04/2022   PLT 254 09/04/2022   NA 140 09/04/2022   K 4.1 09/04/2022   CL  107 09/04/2022   CO2 27 09/04/2022   GLUCOSE 95 09/04/2022   BUN 13 09/04/2022   CREATININE 0.70 09/04/2022   BILITOT 0.4 09/04/2022   ALKPHOS 65 12/21/2019   AST 18 09/04/2022   ALT 14 09/04/2022   PROT 7.0 09/04/2022   ALBUMIN 4.5 12/21/2019   CALCIUM 9.5 09/04/2022   GFRAA 93 06/22/2020   QFTBGOLDPLUS NEGATIVE 03/13/2022    Speciality Comments: No specialty comments available.  Procedures:  No procedures performed Allergies: Humira (2 pen) [adalimumab]   Assessment / Plan:     Visit Diagnoses: Rheumatoid arthritis with rheumatoid factor of multiple sites without organ or systems involvement (HCC) - Plan: Sedimentation rate  Symptoms appear well-controlled today the joint changes on exam are all chronic.  Some symptoms are doing better since stopping the Humira certainly the local and distal skin injection reactions.  Will recheck sedimentation rate today.  Unless this is significantly worse I she is okay to continue just monitoring for now.  If severely elevated or starts to get worsening symptoms again in the next few months could  proceed with switch to Simponi as previously discussed.  Orders: Orders Placed This Encounter  Procedures   Sedimentation rate   No orders of the defined types were placed in this encounter.    Follow-Up Instructions: Return in about 1 year (around 10/26/2023) for RA f/u 1 yr or PRN.   Fuller Plan, MD  Note - This record has been created using [employer].  Chart creation errors have been sought, but may not always  have been located. Such creation errors do not reflect on  the standard of medical care.

## 2022-10-25 NOTE — Telephone Encounter (Signed)
Spoke with patient on 10/24/22 requesting she personally follow up with Linwood Dibbles regarding expediting application., Patient states that she received an email a couple of weeks ago stating she was approved (??) - she states she has not been having arthritis symptoms and is deferring on not taking any medications since she had side effects to Humira. She states her injection reactions are still healing and may wait to start anything new until this has been healed   She has f/u appt on 10/26/22 with Dr. Dimple Casey and would like to further discuss at this OV   Chesley Mires, PharmD, MPH, BCPS, CPP Clinical Pharmacist (Rheumatology and Pulmonology)

## 2022-10-26 ENCOUNTER — Encounter: Payer: Self-pay | Admitting: Internal Medicine

## 2022-10-26 ENCOUNTER — Ambulatory Visit: Payer: Medicare HMO | Attending: Internal Medicine | Admitting: Internal Medicine

## 2022-10-26 VITALS — BP 151/83 | HR 73 | Resp 12 | Ht 62.0 in | Wt 164.0 lb

## 2022-10-26 DIAGNOSIS — M0579 Rheumatoid arthritis with rheumatoid factor of multiple sites without organ or systems involvement: Secondary | ICD-10-CM

## 2022-10-26 NOTE — Telephone Encounter (Signed)
Per OV note from today, plan to monitor disease activity. Patient prefers to be off of treatment for now. Will close follow-up regarding this  Chesley Mires, PharmD, MPH, BCPS, CPP Clinical Pharmacist (Rheumatology and Pulmonology)

## 2022-10-27 LAB — SEDIMENTATION RATE: Sed Rate: 11 mm/h (ref 0–30)

## 2022-11-28 DIAGNOSIS — U071 COVID-19: Secondary | ICD-10-CM | POA: Diagnosis not present

## 2023-02-19 DIAGNOSIS — W19XXXA Unspecified fall, initial encounter: Secondary | ICD-10-CM | POA: Diagnosis not present

## 2023-02-19 DIAGNOSIS — Y92009 Unspecified place in unspecified non-institutional (private) residence as the place of occurrence of the external cause: Secondary | ICD-10-CM | POA: Diagnosis not present

## 2023-02-19 DIAGNOSIS — S2232XA Fracture of one rib, left side, initial encounter for closed fracture: Secondary | ICD-10-CM | POA: Diagnosis not present

## 2023-02-19 DIAGNOSIS — S20212A Contusion of left front wall of thorax, initial encounter: Secondary | ICD-10-CM | POA: Diagnosis not present

## 2023-03-29 ENCOUNTER — Encounter: Payer: Self-pay | Admitting: Gastroenterology

## 2023-03-29 ENCOUNTER — Ambulatory Visit (INDEPENDENT_AMBULATORY_CARE_PROVIDER_SITE_OTHER): Payer: Medicare HMO

## 2023-03-29 VITALS — BP 145/80 | HR 82 | Ht 62.0 in | Wt 167.8 lb

## 2023-03-29 DIAGNOSIS — Z1212 Encounter for screening for malignant neoplasm of rectum: Secondary | ICD-10-CM | POA: Diagnosis not present

## 2023-03-29 DIAGNOSIS — Z780 Asymptomatic menopausal state: Secondary | ICD-10-CM

## 2023-03-29 DIAGNOSIS — Z1231 Encounter for screening mammogram for malignant neoplasm of breast: Secondary | ICD-10-CM | POA: Diagnosis not present

## 2023-03-29 DIAGNOSIS — Z1211 Encounter for screening for malignant neoplasm of colon: Secondary | ICD-10-CM

## 2023-03-29 DIAGNOSIS — Z0000 Encounter for general adult medical examination without abnormal findings: Secondary | ICD-10-CM | POA: Diagnosis not present

## 2023-03-29 NOTE — Progress Notes (Signed)
Subjective:   Raven Haynes is a 73 y.o. female who presents for Medicare Annual (Subsequent) preventive examination.  Visit Complete: In person   Cardiac Risk Factors include: advanced age (>73men, >53 women);hypertension     Objective:    Today's Vitals   03/29/23 0853  BP: (!) 145/80  Pulse: 82  SpO2: 99%  Weight: 167 lb 12.8 oz (76.1 kg)  Height: 5\' 2"  (1.575 m)   Body mass index is 30.69 kg/m.     03/29/2023    8:16 AM 04/03/2021    2:13 PM 03/01/2020   10:12 AM 02/23/2020    9:04 AM 12/19/2018    9:08 AM 12/13/2017    8:42 AM  Advanced Directives  Does Patient Have a Medical Advance Directive? Yes Yes Yes Yes Yes Yes  Type of Estate agent of [place];Living will Living will;Healthcare Power of [employer] Power of [place];Living will  Healthcare Power of [place];Living will Healthcare Power of [place];Living will  Does patient want to make changes to medical advance directive?  No - Patient declined      Copy of Healthcare Power of Attorney in Chart? No - copy requested No - copy requested   No - copy requested No - copy requested  Would patient like information on creating a medical advance directive?    No - Patient declined      Current Medications (verified) Outpatient Encounter Medications as of 03/29/2023  Medication Sig   Apple Cider Vinegar 600 MG CAPS Take 1 capsule by mouth daily.   B Complex-C (SUPER B COMPLEX PO) Take by mouth.   Calcium Citrate-Vitamin D (CITRACAL + D PO) Take 2 tablets by mouth daily. Total daily dose = Calcium 650mg  - Vitamin D3 - 1000 units-  Zn - 5.5mg    Cholecalciferol (D3-1000 PO) Take by mouth daily.   Cyanocobalamin (VITAMIN B 12 PO) Take 1,000 mcg by mouth daily.   Glucosamine-Chondroitin (MOVE FREE PO) Take 1 tablet by mouth daily.   hydrochlorothiazide (HYDRODIURIL) 25 MG tablet Take 1 tablet (25 mg total) by mouth daily.   losartan (COZAAR) 25 MG tablet Take 1 tablet (25 mg total) by mouth  daily.   Magnesium 400 MG CAPS Take by mouth.   Multiple Vitamin (MULTIVITAMIN WITH MINERALS) TABS tablet Take 1 tablet by mouth daily.   Red Yeast Rice Extract (RED YEAST RICE PO) Take 1 tablet by mouth daily. Total daily dose = 600 mg   TURMERIC PO Take 1,000 mg by mouth daily.   Zinc Sulfate (ZINC 15 PO) Take 50 mg by mouth daily.   gabapentin (NEURONTIN) 100 MG capsule Take 1 capsule (100 mg total) by mouth 3 (three) times daily. (Patient not taking: Reported on 03/29/2023)   leflunomide (ARAVA) 20 MG tablet Take 1 tablet (20 mg total) by mouth daily. (Patient not taking: Reported on 03/29/2023)   promethazine-dextromethorphan (PROMETHAZINE-DM) 6.25-15 MG/5ML syrup Take 5 mLs by mouth 4 (four) times daily as needed for cough. (Patient not taking: Reported on 03/29/2023)   No facility-administered encounter medications on file as of 03/29/2023.    Allergies (verified) Humira (2 pen) [adalimumab]   History: Past Medical History:  Diagnosis Date   Hypertension    Olecranon bursitis, right elbow 03/13/2022   Poison ivy dermatitis 07/19/2021   Rheumatoid arthritis [facility])    Past Surgical History:  Procedure Laterality Date   KNEE SURGERY Bilateral 2004,2009   Family History  Problem Relation Age of Onset   Diabetes Mother    Diabetes  Sister    Breast cancer Neg Hx    Social History   Socioeconomic History   Marital status: Married    Spouse name: Ted   Number of children: 2   Years of education: Not on file   Highest education level: Not on file  Occupational History   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    Employer: Production assistant, [employer]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  Tobacco Use   Smoking status: Never    Passive exposure: Never   Smokeless tobacco: Never  Vaping Use   Vaping status: Never Used  Substance and Sexual Activity   Alcohol use: Not Currently    Alcohol/week: 1.0 standard drink of alcohol    Types: 1 Glasses of wine per week   Drug use: No   Sexual activity: Yes   Other Topics Concern   Not on file  Social History Narrative   Lives with husband   Social Drivers of Health   Financial Resource Strain: Low Risk  (03/29/2023)   Overall Financial Resource Strain (CARDIA)    Difficulty of Paying Living Expenses: Not hard at all  Food Insecurity: No Food Insecurity (03/29/2023)   Hunger Vital Sign    Worried About Running Out of Food in the Last Year: Never true    Ran Out of Food in the Last Year: Never true  Transportation Needs: No Transportation Needs (03/29/2023)   PRAPARE - Administrator, Civil Service (Medical): No    Lack of Transportation (Non-Medical): No  Physical Activity: Inactive (03/29/2023)   Exercise Vital Sign    Days of Exercise per Week: 0 days    Minutes of Exercise per Session: 0 min  Stress: No Stress Concern Present (03/29/2023)   [employer] of Occupational Health - Occupational Stress Questionnaire    Feeling of Stress : Not at all  Social Connections: Moderately Integrated (03/29/2023)   Social Connection and Isolation Panel [NHANES]    Frequency of Communication with Friends and Family: More than three times a week    Frequency of Social Gatherings with Friends and Family: Three times a week    Attends Religious Services: More than 4 times per year    Active Member of Clubs or Organizations: No    Attends Banker Meetings: Never    Marital Status: Married    Tobacco Counseling Counseling given: Not Answered   Clinical Intake:  Pre-visit preparation completed: Yes  Pain : No/denies pain     Diabetes: No  How often do you need to have someone help you when you read instructions, pamphlets, or other written materials from your doctor or pharmacy?: 1 - Never  Interpreter Needed?: No  Information entered by :: Jerald Villalona,RMA   Activities of Daily Living    03/29/2023    8:05 AM  In your present state of health, do you have any difficulty performing the following  activities:  Hearing? 0  Vision? 0  Difficulty concentrating or making decisions? 0  Walking or climbing stairs? 0  Dressing or bathing? 0  Doing errands, shopping? 0  Preparing Food and eating ? N  Using the Toilet? N  In the past six months, have you accidently leaked urine? N  Do you have problems with loss of bowel control? N  Managing your Medications? N  Managing your Finances? N  Housekeeping or managing your Housekeeping? N    Patient Care Team: Myrlene Broker, MD as PCP - General (Internal Medicine) Ellin Saba, [facility] (Inactive) as  Pharmacist (Pharmacist) Antony Contras, MD as Consulting Physician (Ophthalmology)  Indicate any recent Medical Services you may have received from other than [facility] providers in the past year (date may be approximate).     Assessment:   This is a routine wellness examination for Mekhia.  Hearing/Vision screen Hearing Screening - Comments:: Denies hearing difficulties   Vision Screening - Comments:: Wears contact lenes   Goals Addressed               This Visit's Progress     Patient Stated (pt-stated)   On track     My goal is to lose some weight and also increase more walking to help control my arthritis.      Depression Screen    03/29/2023    8:25 AM 04/05/2022    9:55 AM 04/03/2021    2:15 PM 04/03/2021    2:09 PM 02/23/2020    9:03 AM 02/23/2020    9:01 AM 12/19/2018    7:59 AM  PHQ 2/9 Scores  PHQ - 2 Score 0 0 0 0 0 1 0  PHQ- 9 Score 1          Fall Risk    03/29/2023    8:17 AM 04/05/2022    9:55 AM 04/03/2021    2:15 PM 04/03/2021    2:09 PM 02/23/2020    9:05 AM  Fall Risk   Falls in the past year? 0 0 0 1 0  Number falls in past yr: 0 0 0 0 0  Injury with Fall? 0 0 0 0 0  Risk for fall due to : No Fall Risks No Fall Risks No Fall Risks  No Fall Risks  Follow up Falls prevention discussed;Falls evaluation completed Falls evaluation completed Falls evaluation completed  Falls evaluation completed     MEDICARE RISK AT HOME: Medicare Risk at Home Any stairs in or around the home?: Yes If so, are there any without handrails?: Yes Home free of loose throw rugs in walkways, pet beds, electrical cords, etc?: Yes Adequate lighting in your home to reduce risk of falls?: Yes Life alert?: No Use of a cane, walker or w/c?: No Grab bars in the bathroom?: No Shower chair or bench in shower?: Yes Elevated toilet seat or a handicapped toilet?: No  TIMED UP AND GO:  Was the test performed?  Yes  Length of time to ambulate 10 feet: 10 sec Gait steady and fast without use of assistive device    Cognitive Function:        03/29/2023    8:05 AM  6CIT Screen  What Year? 0 points  What month? 0 points  What time? 0 points  Count back from 20 0 points  Months in reverse 0 points  Repeat phrase 2 points  Total Score 2 points    Immunizations Immunization History  Administered Date(s) Administered   Fluad Quad(high Dose 65+) 12/19/2018, 12/04/2019   Influenza, High Dose Seasonal PF 12/10/2016, 12/13/2017   Influenza,inj,Quad PF,6+ Mos 11/15/2015   PFIZER(Purple Top)SARS-COV-2 Vaccination 03/31/2019, 04/18/2019   Pneumococcal Conjugate-13 03/22/2015   Pneumococcal Polysaccharide-23 03/10/2014   Tdap 09/09/2011   Zoster Recombinant(Shingrix) 10/04/2017   Zoster, Live 12/24/2013    TDAP status: Due, Education has been provided regarding the importance of this vaccine. Advised may receive this vaccine at local pharmacy or Health Dept. Aware to provide a copy of the vaccination record if obtained from local pharmacy or Health Dept. Verbalized acceptance and understanding.  Flu Vaccine status:  Declined, Education has been provided regarding the importance of this vaccine but patient still declined. Advised may receive this vaccine at local pharmacy or Health Dept. Aware to provide a copy of the vaccination record if obtained from local pharmacy or Health Dept. Verbalized acceptance and  understanding.  Pneumococcal vaccine status: Declined,  Education has been provided regarding the importance of this vaccine but patient still declined. Advised may receive this vaccine at local pharmacy or Health Dept. Aware to provide a copy of the vaccination record if obtained from local pharmacy or Health Dept. Verbalized acceptance and understanding.   Covid-19 vaccine status: Declined, Education has been provided regarding the importance of this vaccine but patient still declined. Advised may receive this vaccine at local pharmacy or Health Dept.or vaccine clinic. Aware to provide a copy of the vaccination record if obtained from local pharmacy or Health Dept. Verbalized acceptance and understanding.  Qualifies for Shingles Vaccine? Yes   Zostavax completed Yes   Shingrix Completed?: No.    Education has been provided regarding the importance of this vaccine. Patient has been advised to call insurance company to determine out of pocket expense if they have not yet received this vaccine. Advised may also receive vaccine at local pharmacy or Health Dept. Verbalized acceptance and understanding.  Screening Tests Health Maintenance  Topic Date Due   Zoster Vaccines- Shingrix (2 of 2) 11/29/2017   DTaP/Tdap/Td (2 - Td or Tdap) 09/08/2021   INFLUENZA VACCINE  10/11/2022   Colonoscopy  07/10/2023   Pneumonia Vaccine 41+ Years old (3 of 3 - PPSV23 or PCV20) 04/06/2023 (Originally 03/21/2020)   MAMMOGRAM  05/30/2023   Medicare Annual Wellness (AWV)  03/28/2024   DEXA SCAN  Completed   Hepatitis C Screening  Completed   HPV VACCINES  Aged Out   COVID-19 Vaccine  Discontinued    Health Maintenance  Health Maintenance Due  Topic Date Due   Zoster Vaccines- Shingrix (2 of 2) 11/29/2017   DTaP/Tdap/Td (2 - Td or Tdap) 09/08/2021   INFLUENZA VACCINE  10/11/2022   Colonoscopy  07/10/2023    Colorectal cancer screening: Referral to GI placed 03/29/23. Pt aware the office will call re:  appt.  Mammogram status: Ordered 03/29/23. Pt provided with contact info and advised to call to schedule appt.   Bone Density status: Ordered 03/29/23. Pt provided with contact info and advised to call to schedule appt.  Lung Cancer Screening: (Low Dose CT Chest recommended if Age 98-80 years, 20 pack-year currently smoking OR have quit w/in 15years.) does not qualify.   Lung Cancer Screening Referral: N/A  Additional Screening:  Hepatitis C Screening: does qualify; Completed 12/30/2019  Vision Screening: Recommended annual ophthalmology exams for early detection of glaucoma and other disorders of the eye. Is the patient up to date with their annual eye exam?  Yes  Who is the provider or what is the name of the office in which the patient attends annual eye exams? Dr. Randon Goldsmith If pt is not established with a provider, would they like to be referred to a provider to establish care? No .   Dental Screening: Recommended annual dental exams for proper oral hygiene   Community Resource Referral / Chronic Care Management: CRR required this visit?  No   CCM required this visit?  No     Plan:     I have personally reviewed and noted the following in the patient's chart:   Medical and social history Use of alcohol, tobacco or illicit drugs  Current medications and supplements including opioid prescriptions. Patient is not currently taking opioid prescriptions. Functional ability and status Nutritional status Physical activity Advanced directives List of other physicians Hospitalizations, surgeries, and ER visits in previous 12 months Vitals Screenings to include cognitive, depression, and falls Referrals and appointments  In addition, I have reviewed and discussed with patient certain preventive protocols, quality metrics, and best practice recommendations. A written personalized care plan for preventive services as well as general preventive health recommendations were provided to  patient.     Janesha Brissette L Lamia Mariner, CMA   03/29/2023   After Visit Summary: (MyChart) Due to this being a telephonic visit, the after visit summary with patients personalized plan was offered to patient via MyChart   Nurse Notes: Patient is due for a DEXA, Mammogram and a colonoscopy.  All orders have been placed today.  She declines all vaccines.  Patient had no concerns to address today.

## 2023-03-29 NOTE — Patient Instructions (Addendum)
Raven Haynes , Thank you for taking time to come for your Medicare Wellness Visit. I appreciate your ongoing commitment to your health goals. Please review the following plan we discussed and let me know if I can assist you in the future.   Referrals/Orders/Follow-Ups/Clinician Recommendations: It was so very nice to meet you today.  Aim for 30 minutes of exercise or brisk walking, 6-8 glasses of water, and 5 servings of fruits and vegetables each day.  Please call [facility] ([address] [place] [address], [place], [place] [address]) to schedule your colonoscopy at [phone].    You have an order for:   [x]   3D Mammogram  [x]   Bone Density     Please call for appointment:  [facility] [address] [place], [place] [address] [phone]  Make sure to wear two-piece clothing.  No lotions, powders, or deodorants the day of the appointment. Make sure to bring picture ID and insurance card.  Bring list of medications you are currently taking including any supplements.   Schedule your [facility] screening mammogram through MyChart!   Log into your MyChart account.  Go to 'Visit' (or 'Appointments' if on mobile App) --> Schedule an Appointment  Under 'Select a Reason for Visit' choose the Mammogram Screening option.  Complete the pre-visit questions and select the time and place that best fits your schedule.    This is a list of the screening recommended for you and due dates:  Health Maintenance  Topic Date Due   Zoster (Shingles) Vaccine (2 of 2) 11/29/2017   DTaP/Tdap/Td vaccine (2 - Td or Tdap) 09/08/2021   Flu Shot  10/11/2022   Colon Cancer Screening  07/10/2023   Pneumonia Vaccine (3 of 3 - PPSV23 or PCV20) 04/06/2023*   Mammogram  05/30/2023   Medicare Annual Wellness Visit  03/28/2024   DEXA scan (bone density measurement)  Completed   Hepatitis C Screening  Completed   HPV Vaccine  Aged Out   COVID-19 Vaccine  Discontinued   *Topic was postponed. The date shown is not the original due date.    Advanced directives: (Copy Requested) Please bring a copy of your health care power of attorney and living will to the office to be added to your chart at your convenience.  Next Medicare Annual Wellness Visit scheduled for next year: Yes

## 2023-04-02 ENCOUNTER — Other Ambulatory Visit: Payer: Self-pay | Admitting: Internal Medicine

## 2023-04-02 DIAGNOSIS — Z1231 Encounter for screening mammogram for malignant neoplasm of breast: Secondary | ICD-10-CM

## 2023-04-08 ENCOUNTER — Encounter: Payer: Self-pay | Admitting: Internal Medicine

## 2023-04-08 ENCOUNTER — Ambulatory Visit (INDEPENDENT_AMBULATORY_CARE_PROVIDER_SITE_OTHER): Payer: Medicare HMO | Admitting: Internal Medicine

## 2023-04-08 VITALS — BP 152/80 | HR 104 | Temp 97.9°F | Ht 62.0 in | Wt 166.0 lb

## 2023-04-08 DIAGNOSIS — Z0000 Encounter for general adult medical examination without abnormal findings: Secondary | ICD-10-CM

## 2023-04-08 DIAGNOSIS — I10 Essential (primary) hypertension: Secondary | ICD-10-CM

## 2023-04-08 DIAGNOSIS — M0579 Rheumatoid arthritis with rheumatoid factor of multiple sites without organ or systems involvement: Secondary | ICD-10-CM | POA: Diagnosis not present

## 2023-04-08 DIAGNOSIS — R7301 Impaired fasting glucose: Secondary | ICD-10-CM

## 2023-04-08 LAB — COMPREHENSIVE METABOLIC PANEL
ALT: 18 U/L (ref 0–35)
AST: 21 U/L (ref 0–37)
Albumin: 4.8 g/dL (ref 3.5–5.2)
Alkaline Phosphatase: 87 U/L (ref 39–117)
BUN: 16 mg/dL (ref 6–23)
CO2: 26 meq/L (ref 19–32)
Calcium: 9.8 mg/dL (ref 8.4–10.5)
Chloride: 101 meq/L (ref 96–112)
Creatinine, Ser: 0.89 mg/dL (ref 0.40–1.20)
GFR: 64.12 mL/min (ref >60.00)
Glucose, Bld: 161 mg/dL — ABNORMAL HIGH (ref 70–99)
Potassium: 3.5 meq/L (ref 3.5–5.1)
Sodium: 137 meq/L (ref 135–145)
Total Bilirubin: 0.6 mg/dL (ref 0.2–1.2)
Total Protein: 7.7 g/dL (ref 6.0–8.3)

## 2023-04-08 LAB — LIPID PANEL
Cholesterol: 236 mg/dL — ABNORMAL HIGH (ref 0–200)
HDL: 55.4 mg/dL (ref >39.00)
LDL Cholesterol: 147 mg/dL — ABNORMAL HIGH (ref 0–99)
NonHDL: 180.45
Total CHOL/HDL Ratio: 4
Triglycerides: 168 mg/dL — ABNORMAL HIGH (ref 0.0–149.0)
VLDL: 33.6 mg/dL (ref 0.0–40.0)

## 2023-04-08 LAB — CBC
HCT: 41.5 % (ref 36.0–46.0)
Hemoglobin: 13.7 g/dL (ref 12.0–15.0)
MCHC: 32.9 g/dL (ref 30.0–36.0)
MCV: 89.3 fL (ref 78.0–100.0)
Platelets: 266 10*3/uL (ref 150.0–400.0)
RBC: 4.65 Mil/uL (ref 3.87–5.11)
RDW: 13.7 % (ref 11.5–15.5)
WBC: 3.8 10*3/uL — ABNORMAL LOW (ref 4.0–10.5)

## 2023-04-08 LAB — HEMOGLOBIN A1C: Hgb A1c MFr Bld: 6.6 % — ABNORMAL HIGH (ref 4.6–6.5)

## 2023-04-08 LAB — SEDIMENTATION RATE: Sed Rate: 22 mm/h (ref 0–30)

## 2023-04-08 NOTE — Progress Notes (Signed)
   Subjective:   Patient ID: Raven Haynes, female    DOB: Jun 24, 1950, 73 y.o.   MRN: [id number]  HPI The patient is here for physical.  PMH, [facility], social history reviewed and updated  Review of Systems  Constitutional: Negative.   HENT: Negative.    Eyes: Negative.   Respiratory:  Negative for cough, chest tightness and shortness of breath.   Cardiovascular:  Negative for chest pain, palpitations and leg swelling.  Gastrointestinal:  Negative for abdominal distention, abdominal pain, constipation, diarrhea, nausea and vomiting.  Musculoskeletal: Negative.   Skin: Negative.   Neurological: Negative.   Psychiatric/Behavioral: Negative.      Objective:  Physical Exam Constitutional:      Appearance: She is well-developed.  HENT:     Head: Normocephalic and atraumatic.  Cardiovascular:     Rate and Rhythm: Normal rate and regular rhythm.  Pulmonary:     Effort: Pulmonary effort is normal. No respiratory distress.     Breath sounds: Normal breath sounds. No wheezing or rales.  Abdominal:     General: Bowel sounds are normal. There is no distension.     Palpations: Abdomen is soft.     Tenderness: There is no abdominal tenderness. There is no rebound.  Musculoskeletal:     Cervical back: Normal range of motion.  Skin:    General: Skin is warm and dry.  Neurological:     Mental Status: She is alert and oriented to person, place, and time.     Coordination: Coordination normal.     Vitals:   04/08/23 0943 04/08/23 0949  BP: (!) 152/80 (!) 152/80  Pulse: (!) 104   Temp: 97.9 F (36.6 C)   TempSrc: Oral   SpO2: 98%   Weight: 166 lb (75.3 kg)   Height: 5\' 2"  (1.575 m)     Assessment & Plan:

## 2023-04-08 NOTE — Patient Instructions (Signed)
We will check the labs today.

## 2023-04-12 ENCOUNTER — Encounter: Payer: Self-pay | Admitting: Internal Medicine

## 2023-04-12 DIAGNOSIS — W19XXXA Unspecified fall, initial encounter: Secondary | ICD-10-CM | POA: Diagnosis not present

## 2023-04-12 DIAGNOSIS — M898X1 Other specified disorders of bone, shoulder: Secondary | ICD-10-CM | POA: Diagnosis not present

## 2023-04-12 NOTE — Assessment & Plan Note (Signed)
 Checking HgA1c and adjust as needed.

## 2023-04-12 NOTE — Assessment & Plan Note (Signed)
Flu shot declines. Pneumonia declines. Shingrix declines. Tetanus declines. Colonoscopy due soon. Mammogram scheduled soon, pap smear aged out and dexa complete. Counseled about sun safety and mole surveillance. Counseled about the dangers of distracted driving. Given 10 year screening recommendations.

## 2023-04-12 NOTE — Assessment & Plan Note (Signed)
Off medication for some months now and checking CBC and ESR for stability. Doing okay without joint pain issues.

## 2023-04-12 NOTE — Assessment & Plan Note (Signed)
BP mildly high today and has not taken meds today. Refilled losartan 25 mg daily and she will monitor and let us know readings. Checking CMP and CBC.

## 2023-04-13 ENCOUNTER — Other Ambulatory Visit: Payer: Self-pay | Admitting: Internal Medicine

## 2023-04-15 NOTE — Progress Notes (Signed)
FYI from patient my chart message:  I understand your comments regarding my test results.   However, I know I need to make a couple different choices about eating, drinking and exercise.   I do not want to take medication at this time.   I will make the lifestyle changes and let's agree to revisit these tests in May.   Let me know your thoughts.   Thank you, Raven Haynes

## 2023-04-16 ENCOUNTER — Telehealth: Payer: Self-pay

## 2023-04-16 NOTE — Telephone Encounter (Signed)
 Copied from CRM [id number]. Topic: Clinical - Request for Lab/Test Order >> Apr 16, 2023  4:21 PM Adaysia C wrote:  Reason for CRM: Frankie with [employer] called on behalf of patient to request a lab bone density order and it is covered at 100% due to her recent bone breakage. The insurance rep(Frankie) also faxed over a bone density lab request to the providers office as well on 04/11/2023 and she is going to fax it to the clinic again. Please follow up with patients insurance for further information if necessary [phone].

## 2023-04-17 DIAGNOSIS — Z1231 Encounter for screening mammogram for malignant neoplasm of breast: Secondary | ICD-10-CM

## 2023-04-17 NOTE — Telephone Encounter (Signed)
 She already has an order in computer and is scheduled for one

## 2023-04-23 ENCOUNTER — Encounter: Payer: Self-pay | Admitting: Family Medicine

## 2023-04-23 ENCOUNTER — Ambulatory Visit: Payer: Self-pay | Admitting: Internal Medicine

## 2023-04-23 ENCOUNTER — Ambulatory Visit (AMBULATORY_SURGERY_CENTER): Payer: Medicare HMO

## 2023-04-23 ENCOUNTER — Ambulatory Visit (INDEPENDENT_AMBULATORY_CARE_PROVIDER_SITE_OTHER): Payer: Medicare HMO | Admitting: Family Medicine

## 2023-04-23 ENCOUNTER — Ambulatory Visit (INDEPENDENT_AMBULATORY_CARE_PROVIDER_SITE_OTHER): Payer: Medicare HMO

## 2023-04-23 VITALS — Ht 62.0 in | Wt 163.0 lb

## 2023-04-23 VITALS — BP 130/82 | HR 68 | Temp 98.0°F | Ht 62.0 in | Wt 164.0 lb

## 2023-04-23 DIAGNOSIS — R0789 Other chest pain: Secondary | ICD-10-CM

## 2023-04-23 DIAGNOSIS — Z1211 Encounter for screening for malignant neoplasm of colon: Secondary | ICD-10-CM

## 2023-04-23 DIAGNOSIS — M25512 Pain in left shoulder: Secondary | ICD-10-CM

## 2023-04-23 DIAGNOSIS — M542 Cervicalgia: Secondary | ICD-10-CM | POA: Diagnosis not present

## 2023-04-23 DIAGNOSIS — W19XXXA Unspecified fall, initial encounter: Secondary | ICD-10-CM

## 2023-04-23 DIAGNOSIS — M19012 Primary osteoarthritis, left shoulder: Secondary | ICD-10-CM | POA: Diagnosis not present

## 2023-04-23 DIAGNOSIS — M4802 Spinal stenosis, cervical region: Secondary | ICD-10-CM | POA: Diagnosis not present

## 2023-04-23 DIAGNOSIS — M47812 Spondylosis without myelopathy or radiculopathy, cervical region: Secondary | ICD-10-CM | POA: Diagnosis not present

## 2023-04-23 MED ORDER — SUFLAVE 178.7 G PO SOLR: 1.0000 | Freq: Once | ORAL | 0 refills | Status: AC

## 2023-04-23 NOTE — Patient Instructions (Addendum)
Continue to wear the sling.  We are getting an xray today. We will be in contact with any abnormal results that require further attention.  I have sent a referral to orthopedics, someone will reach out to get you scheduled.   Continue tylenol as needed.  May travel if you can tolerate the pain.

## 2023-04-23 NOTE — Progress Notes (Signed)
Acute Office Visit  Subjective:     Patient ID: Raven Haynes, female    DOB: 21-Oct-1950, 73 y.o.   MRN: [id number]  Chief Complaint  Patient presents with   Fall    Collar bone pain following fall on 01/31. Patient had slipped on top step when using steps from garage to kitchen. Has followed up with urgent care which ran an x-ray on their collarbone. Noted no fracture at that point. Bruising and swelling present. Patient currently treating with tylenol    HPI Patient is in today for evaluation of left clavicle, left neck, left hip pain after a fall.  Reports left clavicular swelling, bruising from the left neck down the left chest to the left breast. Was seen at urgent care on 04/12/2023, left shoulder x-ray was performed and negative.  Urgent care note reviewed by me. Has been taking Tylenol for pain which is controlling it some. Patient has been wearing a sling for comfort. Denies any numbness, tingling, decrease sensation in the extremity. Had a mechanical fall and hit clavicle on trash can from this step going from the house to the garage. Denies head injury. She is accompanied by her husband today. Denies other concerns today. Medical history as outlined below.   ROS Per HPI      Objective:    BP 130/82   Pulse 68   Temp 98 F (36.7 C)   Ht 5\' 2"  (1.575 m)   Wt 164 lb (74.4 kg)   SpO2 99%   BMI 30.00 kg/m    Physical Exam Vitals and nursing note reviewed.  Constitutional:      Appearance: Normal appearance. She is normal weight.  HENT:     Head: Normocephalic and atraumatic.     Right Ear: Tympanic membrane and ear canal normal.     Left Ear: Tympanic membrane and ear canal normal.     Nose: Nose normal.  Eyes:     Extraocular Movements: Extraocular movements intact.     Pupils: Pupils are equal, round, and reactive to light.  Cardiovascular:     Rate and Rhythm: Normal rate and regular rhythm.     Heart sounds: Normal heart sounds.  Pulmonary:      Effort: Pulmonary effort is normal.     Breath sounds: Normal breath sounds.  Musculoskeletal:        General: Normal range of motion.       Arms:     Cervical back: Normal range of motion.     Comments: Red area is the area of tenderness and swelling. Purple outline is area of bruising. Limited ROM, in sling for visit  Neurological:     General: No focal deficit present.     Mental Status: She is alert and oriented to person, place, and time.  Psychiatric:        Mood and Affect: Mood normal.        Thought Content: Thought content normal.    No results found for any visits on 04/23/23.      Assessment & Plan:  1. Fall, initial encounter (Primary)  - DG Chest 2 View; Future - DG Cervical Spine 2 or 3 views; Future - DG Clavicle Left; Future  2. Chest wall pain  - DG Chest 2 View; Future  3. Arthralgia of left acromioclavicular joint  - DG Clavicle Left; Future  4. Neck pain  - DG Cervical Spine 2 or 3 views; Future  - Referral to ortho   No  orders of the defined types were placed in this encounter.   Return if symptoms worsen or fail to improve.  Moshe Cipro, FNP

## 2023-04-23 NOTE — Progress Notes (Signed)

## 2023-04-23 NOTE — Telephone Encounter (Signed)
Chief Complaint: collar bone pain Symptoms: left-sided collar bone pain and bruising Frequency: since fall on 1/31 Pertinent Negatives: Patient denies fever, SOB Disposition: [] ED /[] Urgent Care (no appt availability in office) / [x] Appointment(In office/virtual)/ []  [facility]/ [] Home Care/ [] Refused Recommended Disposition /[] [facility]/ []  Follow-up with PCP Additional Notes: Husband Ted calling for pt. RN can hear pt in background. Pt had a mechanical fall on 1/31. She was walking down the steps into her garage with socks on and tripped and fell forward into a very large, plastic trashcan. Pt fell onto trash can with her left side, injuring her left collarbone/chest and knee. Pt went to UC immediatley after and had an Xray which was negative for fracture. Pt wearing a sling intermittently but not as often as she should be per husband. Pt still having pain, 6-7/10, excruciating with coughing, sneezing, position changes. Bruised from neck down through chest on left side. Per protocol RN advised pt gets injury assess again by a provider. Pt scheduled for today 2/11 at 1320 at [facility], pt and husband agreeable. Advised pt to call back for worsening before then, pt/husband verbalized understanding.   Copied from CRM [id number]. Topic: Clinical - Red Word Triage >> Apr 23, 2023  9:52 AM Efraim Kaufmann C wrote: Red Word that prompted transfer to Nurse Triage: patient fell about a week ago, did get x-ray and doesn't look like anything is broken, however she is still suffering from complications and pain from fall. Perhaps thinks she may need physical therapy or something Reason for Disposition  [1] Fall AND [2] went to emergency department for evaluation or treatment    Went to UC, not ED  Answer Assessment - Initial Assessment Questions 1. MECHANISM: "How did the fall happen?"     Larey Seat going down the steps going into the garage, fell into a plastic trash can and injured her  left-sided collarbone on 1/31. Had a pair of socks on and landed her left side/collarbone on the plastic trash can, banged her knee too 3. ONSET: "When did the fall happen?" (e.g., minutes, hours, or days ago)     1/31 4. LOCATION: "What part of the body hit the ground?" (e.g., back, buttocks, head, hips, knees, hands, head, stomach)     Chest/collar bone and knee, no head strike 5. INJURY: "Did you hurt (injure) yourself when you fell?" If Yes, ask: "What did you injure? Tell me more about this?" (e.g., body area; type of injury; pain severity)"     Chest/collar bone and knee 6. PAIN: "Is there any pain?" If Yes, ask: "How bad is the pain?" (e.g., Scale 1-10; or mild,  moderate, severe)   - NONE (0): No pain   - MILD (1-3): Doesn't interfere with normal activities    - MODERATE (4-7): Interferes with normal activities or awakens from sleep    - SEVERE (8-10): Excruciating pain, unable to do any normal activities      5-6/10 with certain movements, worse when getting up and walking/movement, pounding of feet while walking causes pain to "jolt through her" 7. SIZE: For cuts, bruises, or swelling, ask: "How large is it?" (e.g., inches or centimeters)      Top of neck on left shoulder down into her chest is bruised, looks like it is starting to get better but is still bad 9. OTHER SYMPTOMS: "Do you have any other symptoms?" (e.g., dizziness, fever, weakness; new onset or worsening).      "Excruciating" pain with coughing  and sneezing, not wearing a sling "as much as she should" - husband states she is trying to move around and do things around the house with limited range of motion, no difficulty breathing but pain with sneezing 10. CAUSE: "What do you think caused the fall (or falling)?" (e.g., tripped, dizzy spell)       Tripping down the steps  Protocols used: Falls and [facility]

## 2023-04-24 ENCOUNTER — Encounter: Payer: Self-pay | Admitting: Family Medicine

## 2023-04-24 NOTE — Progress Notes (Unsigned)
   Office Visit Note   Patient: Raven Haynes           Date of Birth: 03-Oct-1950           MRN: [id number] Visit Date: 04/26/2023              Requested by: Moshe Cipro, FNP [address] [place],  [place] [address] PCP: Myrlene Broker, MD   Assessment & Plan: Visit Diagnoses: No diagnosis found.  Plan: ***  Follow-Up Instructions: No follow-ups on file.   Orders:  No orders of the defined types were placed in this encounter.  No orders of the defined types were placed in this encounter.     Procedures: No procedures performed   Clinical Data: No additional findings.   Subjective: No chief complaint on file.   HPI  Review of Systems   Objective: Vital Signs: There were no vitals taken for this visit.  Physical Exam  Ortho Exam  Specialty Comments:  No specialty comments available.  Imaging: No results found.   PMFS History: Patient Active Problem List   Diagnosis Date Noted   Ganglion cyst of wrist, left 03/13/2022   Blurred vision, left eye 04/19/2021   Rheumatoid arthritis with rheumatoid factor of multiple sites without organ or systems involvement (HCC) 12/30/2019   Impaired fasting blood sugar 12/19/2018   Routine general medical examination at a health care facility 11/15/2015   Essential hypertension 03/22/2015   Past Medical History:  Diagnosis Date   Hypertension    Olecranon bursitis, right elbow 03/13/2022   Poison ivy dermatitis 07/19/2021   Rheumatoid arthritis (HCC)     Family History  Problem Relation Age of Onset   Diabetes Mother    Diabetes Sister    Breast cancer Neg Hx    Colon cancer Neg Hx    Colon polyps Neg Hx    Esophageal cancer Neg Hx    Rectal cancer Neg Hx    Stomach cancer Neg Hx     Past Surgical History:  Procedure Laterality Date   KNEE SURGERY Bilateral 2004,2009   Social History   Occupational History   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

## 2023-04-26 ENCOUNTER — Ambulatory Visit: Payer: Medicare HMO | Admitting: Orthopaedic Surgery

## 2023-04-26 ENCOUNTER — Encounter: Payer: Self-pay | Admitting: Orthopaedic Surgery

## 2023-04-26 DIAGNOSIS — S40012A Contusion of left shoulder, initial encounter: Secondary | ICD-10-CM

## 2023-05-09 ENCOUNTER — Encounter: Payer: Medicare HMO | Admitting: Gastroenterology

## 2023-05-15 ENCOUNTER — Encounter: Payer: Medicare HMO | Admitting: Gastroenterology

## 2023-05-22 ENCOUNTER — Encounter: Payer: Self-pay | Admitting: Gastroenterology

## 2023-05-25 ENCOUNTER — Encounter: Payer: Self-pay | Admitting: Certified Registered Nurse Anesthetist

## 2023-05-29 ENCOUNTER — Ambulatory Visit (AMBULATORY_SURGERY_CENTER): Payer: Medicare HMO | Admitting: Gastroenterology

## 2023-05-29 ENCOUNTER — Encounter: Payer: Self-pay | Admitting: Gastroenterology

## 2023-05-29 VITALS — BP 119/82 | HR 51 | Temp 97.3°F | Resp 12 | Ht 62.0 in | Wt 163.0 lb

## 2023-05-29 DIAGNOSIS — K5730 Diverticulosis of large intestine without perforation or abscess without bleeding: Secondary | ICD-10-CM | POA: Diagnosis not present

## 2023-05-29 DIAGNOSIS — K648 Other hemorrhoids: Secondary | ICD-10-CM | POA: Diagnosis not present

## 2023-05-29 DIAGNOSIS — Z1211 Encounter for screening for malignant neoplasm of colon: Secondary | ICD-10-CM | POA: Diagnosis not present

## 2023-05-29 DIAGNOSIS — I10 Essential (primary) hypertension: Secondary | ICD-10-CM | POA: Diagnosis not present

## 2023-05-29 DIAGNOSIS — M069 Rheumatoid arthritis, unspecified: Secondary | ICD-10-CM | POA: Diagnosis not present

## 2023-05-29 MED ORDER — SODIUM CHLORIDE 0.9 % IV SOLN: 500.0000 mL | Freq: Once | INTRAVENOUS | Status: DC

## 2023-05-29 NOTE — Op Note (Signed)
 [facility] Patient Name: Raven Haynes Procedure Date: 05/29/2023 12:54 PM MRN: [id number] Endoscopist: Viviann Spare P. Adela Lank , MD, [id number] Age: 73 Referring MD:  Date of Birth: 10/10/1950 Gender: Female Account #: [id number] Procedure:                Colonoscopy Indications:              Screening for colorectal malignant neoplasm Medicines:                Monitored Anesthesia Care Procedure:                Pre-Anesthesia Assessment:                           - Prior to the procedure, a History and Physical                            was performed, and patient medications and                            allergies were reviewed. The patient's tolerance of                            previous anesthesia was also reviewed. The risks                            and benefits of the procedure and the sedation                            options and risks were discussed with the patient.                            All questions were answered, and informed consent                            was obtained. Prior Anticoagulants: The patient has                            taken no anticoagulant or antiplatelet agents. ASA                            Grade Assessment: II - A patient with mild systemic                            disease. After reviewing the risks and benefits,                            the patient was deemed in satisfactory condition to                            undergo the procedure.                           After obtaining informed consent, the colonoscope  was passed under direct vision. Throughout the                            procedure, the patient's blood pressure, pulse, and                            oxygen saturations were monitored continuously. The                            Olympus Scope SN [id number] was introduced through the                            anus and advanced to the the cecum, identified by                             appendiceal orifice and ileocecal valve. The                            colonoscopy was performed without difficulty. The                            patient tolerated the procedure well. The quality                            of the bowel preparation was good. The ileocecal                            valve, appendiceal orifice, and rectum were                            photographed. Scope In: 1:39:46 PM Scope Out: 1:57:52 PM Scope Withdrawal Time: 0 hours 13 minutes 54 seconds  Total Procedure Duration: 0 hours 18 minutes 6 seconds  Findings:                 The perianal and digital rectal examinations were                            normal.                           Many diverticula were found in the entire colon -                            highest burden in the right colon (many), mild in                            left colon.                           Internal hemorrhoids were found during                            retroflexion. The hemorrhoids were small.  The exam was otherwise without abnormality. Complications:            No immediate complications. Estimated blood loss:                            Minimal. Estimated Blood Loss:     Estimated blood loss was minimal. Impression:               - Diverticulosis in the entire examined colon.                           - Internal hemorrhoids.                           - The examination was otherwise normal.                           - No polyps Recommendation:           - Patient has a contact number available for                            emergencies. The signs and symptoms of potential                            delayed complications were discussed with the                            patient. Return to normal activities tomorrow.                            Written discharge instructions were provided to the                            patient.                           - Resume previous diet.                            - Continue present medications.                           - No further routine colonoscopy exams are                            recommended given the results of this exam and the                            patient's age (she would not be due again until age                            41, at which age we do not recommend routine                            screening) Viviann Spare P. Rahmel Nedved, MD 05/29/2023 2:01:56 PM This report has been signed  electronically.

## 2023-05-29 NOTE — Progress Notes (Signed)
 Report given to PACU, vss

## 2023-05-29 NOTE — Progress Notes (Signed)
 [facility] History and Physical   Primary Care Physician:  Myrlene Broker, MD   Reason for Procedure:   Colon cancer screening  Plan:    colonoscopy     HPI: Raven Haynes is a 73 y.o. female  here for colonoscopy screening - last exam 2015.  Marland Kitchen Patient denies any bowel symptoms at this time. No family history of colon cancer known. Otherwise feels well without any cardiopulmonary symptoms.   I have discussed risks / benefits of anesthesia and endoscopic procedure with Raven Haynes and they wish to proceed with the exams as outlined today.    Past Medical History:  Diagnosis Date   Hypertension    Olecranon bursitis, right elbow 03/13/2022   Poison ivy dermatitis 07/19/2021   Rheumatoid arthritis [facility])     Past Surgical History:  Procedure Laterality Date   KNEE SURGERY Bilateral 2004,2009    Prior to Admission medications   Medication Sig Start Date End Date Taking? Authorizing Provider  B Complex-C (SUPER B COMPLEX PO) Take by mouth.   Yes [provider]  Calcium Citrate-Vitamin D (CITRACAL + D PO) Take 2 tablets by mouth daily. Total daily dose = Calcium 650mg  - Vitamin D3 - 1000 units-  Zn - 5.5mg    Yes [provider]  Cholecalciferol (D3-1000 PO) Take by mouth daily.   Yes [provider]  Cyanocobalamin (VITAMIN B 12 PO) Take 1,000 mcg by mouth daily.   Yes [provider]  Glucosamine-Chondroitin (MOVE FREE PO) Take 1 tablet by mouth daily.   Yes [provider]  hydrochlorothiazide (HYDRODIURIL) 25 MG tablet TAKE ONE TABLET BY MOUTH ONE TIME DAILY 04/17/23  Yes Myrlene Broker, MD  losartan (COZAAR) 25 MG tablet TAKE ONE TABLET BY MOUTH ONE TIME DAILY 04/16/23  Yes Myrlene Broker, MD  Magnesium 400 MG CAPS Take by mouth.   Yes [provider]  Multiple Vitamin (MULTIVITAMIN WITH MINERALS) TABS tablet Take 1 tablet by mouth daily.   Yes [provider]  Red Yeast Rice Extract  (RED YEAST RICE PO) Take 1 tablet by mouth daily. Total daily dose = 600 mg   Yes [provider]  TURMERIC PO Take 1,000 mg by mouth daily.   Yes [provider]  Zinc Sulfate (ZINC 15 PO) Take 50 mg by mouth daily.   Yes [provider]  Apple Cider Vinegar 600 MG CAPS Take 1 capsule by mouth daily. Patient not taking: Reported on 05/29/2023    [provider]  HYDROcodone-acetaminophen (NORCO/VICODIN) 5-325 MG tablet Take 1 tablet by mouth 3 (three) times daily as needed. 02/19/23   [provider]    Current Outpatient Medications  Medication Sig Dispense Refill   B Complex-C (SUPER B COMPLEX PO) Take by mouth.     Calcium Citrate-Vitamin D (CITRACAL + D PO) Take 2 tablets by mouth daily. Total daily dose = Calcium 650mg  - Vitamin D3 - 1000 units-  Zn - 5.5mg      Cholecalciferol (D3-1000 PO) Take by mouth daily.     Cyanocobalamin (VITAMIN B 12 PO) Take 1,000 mcg by mouth daily.     Glucosamine-Chondroitin (MOVE FREE PO) Take 1 tablet by mouth daily.     hydrochlorothiazide (HYDRODIURIL) 25 MG tablet TAKE ONE TABLET BY MOUTH ONE TIME DAILY 90 tablet 0   losartan (COZAAR) 25 MG tablet TAKE ONE TABLET BY MOUTH ONE TIME DAILY 90 tablet 0   Magnesium 400 MG CAPS Take by mouth.  Multiple Vitamin (MULTIVITAMIN WITH MINERALS) TABS tablet Take 1 tablet by mouth daily.     Red Yeast Rice Extract (RED YEAST RICE PO) Take 1 tablet by mouth daily. Total daily dose = 600 mg     TURMERIC PO Take 1,000 mg by mouth daily.     Zinc Sulfate (ZINC 15 PO) Take 50 mg by mouth daily.     Apple Cider Vinegar 600 MG CAPS Take 1 capsule by mouth daily. (Patient not taking: Reported on 05/29/2023)     HYDROcodone-acetaminophen (NORCO/VICODIN) 5-325 MG tablet Take 1 tablet by mouth 3 (three) times daily as needed.     Current Facility-Administered Medications  Medication Dose Route Frequency Provider Last Rate Last Admin   0.9 %  sodium chloride infusion  500 mL  Intravenous Once Kwesi Sangha, Willaim Rayas, MD        Allergies as of 05/29/2023 - Review Complete 05/29/2023  Allergen Reaction Noted   Humira (2 pen) [adalimumab] Rash 09/04/2022    Family History  Problem Relation Age of Onset   Diabetes Mother    Diabetes Sister    Breast cancer Neg Hx    Colon cancer Neg Hx    Colon polyps Neg Hx    Esophageal cancer Neg Hx    Rectal cancer Neg Hx    Stomach cancer Neg Hx     Social History   Socioeconomic History   Marital status: Married    Spouse name: Ted   Number of children: 2   Years of education: Not on file   Highest education level: Not on file  Occupational History   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[employer]    Employer: [employer], 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  Tobacco Use   Smoking status: Never    Passive exposure: Never   Smokeless tobacco: Never  Vaping Use   Vaping status: Never Used  Substance and Sexual Activity   Alcohol use: Not Currently    Alcohol/week: 1.0 standard drink of alcohol    Types: 1 Glasses of wine per week   Drug use: No   Sexual activity: Yes  Other Topics Concern   Not on file  Social History Narrative   Lives with husband   Social Drivers of Health   Financial Resource Strain: Low Risk  (03/29/2023)   Overall Financial Resource Strain (CARDIA)    Difficulty of Paying Living Expenses: Not hard at all  Food Insecurity: No Food Insecurity (03/29/2023)   Hunger Vital Sign    Worried About Running Out of Food in the Last Year: Never true    Ran Out of Food in the Last Year: Never true  Transportation Needs: No Transportation Needs (03/29/2023)   PRAPARE - Administrator, Civil Service (Medical): No    Lack of Transportation (Non-Medical): No  Physical Activity: Inactive (03/29/2023)   Exercise Vital Sign    Days of Exercise per Week: 0 days    Minutes of Exercise per Session: 0 min  Stress: No Stress Concern Present (03/29/2023)   [employer] of Occupational Health - Occupational  Stress Questionnaire    Feeling of Stress : Not at all  Social Connections: Moderately Integrated (03/29/2023)   Social Connection and Isolation Panel [NHANES]    Frequency of Communication with Friends and Family: More than three times a week    Frequency of Social Gatherings with Friends and Family: Three times a week    Attends Religious Services: More than 4 times per year    Active Member  of Clubs or Organizations: No    Attends Banker Meetings: Never    Marital Status: Married  Catering manager Violence: Not At Risk (03/29/2023)   Humiliation, Afraid, Rape, and Kick questionnaire    Fear of Current or Ex-Partner: No    Emotionally Abused: No    Physically Abused: No    Sexually Abused: No    Review of Systems: All other review of systems negative except as mentioned in the HPI.  Physical Exam: Vital signs BP (!) 167/81   Pulse (!) 59   Temp (!) 97.3 F (36.3 C)   Resp 15   Ht 5\' 2"  (1.575 m)   Wt 163 lb (73.9 kg)   SpO2 95%   BMI 29.81 kg/m   General:   Alert,  Well-developed, pleasant and cooperative in NAD Lungs:  Clear throughout to auscultation.   Heart:  Regular rate and rhythm Abdomen:  Soft, nontender and nondistended.   Neuro/Psych:  Alert and cooperative. Normal mood and affect. A and O x 3  Harlin Rain, MD [facility]

## 2023-05-29 NOTE — Progress Notes (Signed)
 1325 BP 182/84, Labetalol given IV, MD update, vss

## 2023-05-29 NOTE — Progress Notes (Signed)
 ER VISIT Bruised shoulder.

## 2023-05-29 NOTE — Patient Instructions (Addendum)
 Continue present medications. No further routine colonoscopy exams are recommended given the results of this exam and the patient's age (she would not be due again until age  73, at which age we do not recommend routine screening)   Please read over handouts provided                        YOU HAD AN ENDOSCOPIC PROCEDURE TODAY AT THE [facility]:   Refer to the procedure report that was given to you for any specific questions about what was found during the examination.  If the procedure report does not answer your questions, please call your gastroenterologist to clarify.  If you requested that your care partner not be given the details of your procedure findings, then the procedure report has been included in a sealed envelope for you to review at your convenience later.  YOU SHOULD EXPECT: Some feelings of bloating in the abdomen. Passage of more gas than usual.  Walking can help get rid of the air that was put into your GI tract during the procedure and reduce the bloating. If you had a lower endoscopy (such as a colonoscopy or flexible sigmoidoscopy) you may notice spotting of blood in your stool or on the toilet paper. If you underwent a bowel prep for your procedure, you may not have a normal bowel movement for a few days.  Please Note:  You might notice some irritation and congestion in your nose or some drainage.  This is from the oxygen used during your procedure.  There is no need for concern and it should clear up in a day or so.  SYMPTOMS TO REPORT IMMEDIATELY:  Following lower endoscopy (colonoscopy or flexible sigmoidoscopy):  Excessive amounts of blood in the stool  Significant tenderness or worsening of abdominal pains  Swelling of the abdomen that is new, acute  Fever of 100F or higher  For urgent or emergent issues, a gastroenterologist can be reached at any hour by calling [phone]. Do not use MyChart messaging for urgent concerns.    DIET:  We do  recommend a small meal at first, but then you may proceed to your regular diet.  Drink plenty of fluids but you should avoid alcoholic beverages for 24 hours.  ACTIVITY:  You should plan to take it easy for the rest of today and you should NOT DRIVE or use heavy machinery until tomorrow (because of the sedation medicines used during the test).    FOLLOW UP: Our staff will call the number listed on your records the next business day following your procedure.  We will call around 7:15- 8:00 am to check on you and address any questions or concerns that you may have regarding the information given to you following your procedure. If we do not reach you, we will leave a message.       SIGNATURES/CONFIDENTIALITY: You and/or your care partner have signed paperwork which will be entered into your electronic medical record.  These signatures attest to the fact that that the information above on your After Visit Summary has been reviewed and is understood.  Full responsibility of the confidentiality of this discharge information lies with you and/or your care-partner.

## 2023-05-30 ENCOUNTER — Telehealth: Payer: Self-pay | Admitting: *Deleted

## 2023-05-30 NOTE — Telephone Encounter (Signed)
  Follow up Call-     05/29/2023   12:59 PM  Call back number  Post procedure Call Back phone  # [phone]  Permission to leave phone message Yes     Patient questions:  Do you have a fever, pain , or abdominal swelling? No. Pain Score  0 *  Have you tolerated food without any problems? Yes.    Have you been able to return to your normal activities? Yes.    Do you have any questions about your discharge instructions: Diet   No. Medications  No. Follow up visit  No.  Do you have questions or concerns about your Care? No.  Actions: * If pain score is 4 or above: No action needed, pain <4.

## 2023-06-06 ENCOUNTER — Ambulatory Visit
Admission: RE | Admit: 2023-06-06 | Discharge: 2023-06-06 | Disposition: A | Discharge status: Home / Self Care | Payer: Medicare HMO | Source: Ambulatory Visit | Attending: Internal Medicine | Admitting: Internal Medicine

## 2023-06-06 DIAGNOSIS — Z1231 Encounter for screening mammogram for malignant neoplasm of breast: Secondary | ICD-10-CM | POA: Diagnosis not present

## 2023-06-10 ENCOUNTER — Encounter: Payer: Self-pay | Admitting: Internal Medicine

## 2023-06-18 ENCOUNTER — Encounter: Payer: Self-pay | Admitting: Internal Medicine

## 2023-06-18 LAB — HM MAMMOGRAPHY

## 2023-06-19 DIAGNOSIS — L821 Other seborrheic keratosis: Secondary | ICD-10-CM | POA: Diagnosis not present

## 2023-06-19 DIAGNOSIS — D225 Melanocytic nevi of trunk: Secondary | ICD-10-CM | POA: Diagnosis not present

## 2023-06-19 DIAGNOSIS — L814 Other melanin hyperpigmentation: Secondary | ICD-10-CM | POA: Diagnosis not present

## 2023-06-28 DIAGNOSIS — Z13820 Encounter for screening for osteoporosis: Secondary | ICD-10-CM | POA: Diagnosis not present

## 2023-07-05 ENCOUNTER — Ambulatory Visit: Admitting: Internal Medicine

## 2023-07-05 ENCOUNTER — Encounter: Payer: Self-pay | Admitting: Internal Medicine

## 2023-07-05 VITALS — BP 142/72 | HR 65 | Temp 97.8°F | Ht 62.0 in | Wt 161.0 lb

## 2023-07-05 DIAGNOSIS — E785 Hyperlipidemia, unspecified: Secondary | ICD-10-CM

## 2023-07-05 DIAGNOSIS — I10 Essential (primary) hypertension: Secondary | ICD-10-CM

## 2023-07-05 DIAGNOSIS — E119 Type 2 diabetes mellitus without complications: Secondary | ICD-10-CM | POA: Insufficient documentation

## 2023-07-05 DIAGNOSIS — E1169 Type 2 diabetes mellitus with other specified complication: Secondary | ICD-10-CM

## 2023-07-05 DIAGNOSIS — E118 Type 2 diabetes mellitus with unspecified complications: Secondary | ICD-10-CM | POA: Insufficient documentation

## 2023-07-05 LAB — LIPID PANEL
Cholesterol: 213 mg/dL — ABNORMAL HIGH (ref 0–200)
HDL: 50.8 mg/dL (ref >39.00)
LDL Cholesterol: 133 mg/dL — ABNORMAL HIGH (ref 0–99)
NonHDL: 161.77
Total CHOL/HDL Ratio: 4
Triglycerides: 142 mg/dL (ref 0.0–149.0)
VLDL: 28.4 mg/dL (ref 0.0–40.0)

## 2023-07-05 LAB — POCT GLYCOSYLATED HEMOGLOBIN (HGB A1C): HbA1c POC (<> result, manual entry): 5.9 % (ref 4.0–5.6)

## 2023-07-05 NOTE — Assessment & Plan Note (Signed)
 POC HgA1c done and 5.9 today which is more consistent with baseline. She did have 6.6 last time and a 6.5 in the past. We will monitor in 6 months and then again in 6 months. She has high lipids which we are rechecking today.

## 2023-07-05 NOTE — Assessment & Plan Note (Signed)
 Checking lipid panel.

## 2023-07-05 NOTE — Progress Notes (Signed)
   Subjective:   Patient ID: Raven Haynes, female    DOB: Jul 21, 1950, 73 y.o.   MRN: [id number]  HPI The patient is a 73 YO female coming in for medical management of new diabetes.   Review of Systems  Constitutional: Negative.   HENT: Negative.    Eyes: Negative.   Respiratory:  Negative for cough, chest tightness and shortness of breath.   Cardiovascular:  Negative for chest pain, palpitations and leg swelling.  Gastrointestinal:  Negative for abdominal distention, abdominal pain, constipation, diarrhea, nausea and vomiting.  Musculoskeletal: Negative.   Skin: Negative.   Neurological: Negative.   Psychiatric/Behavioral: Negative.      Objective:  Physical Exam Constitutional:      Appearance: She is well-developed.  HENT:     Head: Normocephalic and atraumatic.  Cardiovascular:     Rate and Rhythm: Normal rate and regular rhythm.  Pulmonary:     Effort: Pulmonary effort is normal. No respiratory distress.     Breath sounds: Normal breath sounds. No wheezing or rales.  Abdominal:     General: Bowel sounds are normal. There is no distension.     Palpations: Abdomen is soft.     Tenderness: There is no abdominal tenderness. There is no rebound.  Musculoskeletal:     Cervical back: Normal range of motion.  Skin:    General: Skin is warm and dry.  Neurological:     Mental Status: She is alert and oriented to person, place, and time.     Coordination: Coordination normal.     Vitals:   07/05/23 0931 07/05/23 0936 07/05/23 1027  BP: (!) 160/98 (!) 160/98 (!) 142/72  Pulse: 65    Temp: 97.8 F (36.6 C)    TempSrc: Oral    SpO2: 98%    Weight: 161 lb (73 kg)    Height: 5\' 2"  (1.575 m)      Assessment & Plan:

## 2023-07-05 NOTE — Assessment & Plan Note (Signed)
 BP high today but normal at home. Continue hydrochlorothiazide  25 mg daily and losartan  25 mg daily.

## 2023-07-08 ENCOUNTER — Encounter: Payer: Self-pay | Admitting: Internal Medicine

## 2023-08-23 DIAGNOSIS — J040 Acute laryngitis: Secondary | ICD-10-CM | POA: Diagnosis not present

## 2023-08-23 DIAGNOSIS — Z20822 Contact with and (suspected) exposure to covid-19: Secondary | ICD-10-CM | POA: Diagnosis not present

## 2023-08-23 DIAGNOSIS — J069 Acute upper respiratory infection, unspecified: Secondary | ICD-10-CM | POA: Diagnosis not present

## 2023-08-28 ENCOUNTER — Telehealth (HOSPITAL_BASED_OUTPATIENT_CLINIC_OR_DEPARTMENT_OTHER): Payer: Self-pay

## 2023-09-03 ENCOUNTER — Ambulatory Visit: Payer: Self-pay | Admitting: Internal Medicine

## 2023-09-03 ENCOUNTER — Other Ambulatory Visit (HOSPITAL_BASED_OUTPATIENT_CLINIC_OR_DEPARTMENT_OTHER)

## 2023-09-03 ENCOUNTER — Ambulatory Visit (HOSPITAL_BASED_OUTPATIENT_CLINIC_OR_DEPARTMENT_OTHER)
Admission: RE | Admit: 2023-09-03 | Discharge: 2023-09-03 | Disposition: A | Discharge status: Home / Self Care | Source: Ambulatory Visit | Attending: Internal Medicine | Admitting: Internal Medicine

## 2023-09-03 DIAGNOSIS — Z780 Asymptomatic menopausal state: Secondary | ICD-10-CM | POA: Insufficient documentation

## 2023-09-03 DIAGNOSIS — M8589 Other specified disorders of bone density and structure, multiple sites: Secondary | ICD-10-CM | POA: Diagnosis not present

## 2023-09-10 ENCOUNTER — Encounter: Payer: Self-pay | Admitting: Internal Medicine

## 2023-09-10 ENCOUNTER — Ambulatory Visit (INDEPENDENT_AMBULATORY_CARE_PROVIDER_SITE_OTHER): Admitting: Internal Medicine

## 2023-09-10 VITALS — BP 140/88 | HR 71 | Temp 97.7°F | Ht 62.0 in | Wt 162.0 lb

## 2023-09-10 DIAGNOSIS — E1169 Type 2 diabetes mellitus with other specified complication: Secondary | ICD-10-CM

## 2023-09-10 DIAGNOSIS — I10 Essential (primary) hypertension: Secondary | ICD-10-CM | POA: Diagnosis not present

## 2023-09-10 DIAGNOSIS — E118 Type 2 diabetes mellitus with unspecified complications: Secondary | ICD-10-CM | POA: Diagnosis not present

## 2023-09-10 DIAGNOSIS — E785 Hyperlipidemia, unspecified: Secondary | ICD-10-CM | POA: Diagnosis not present

## 2023-09-10 DIAGNOSIS — M8589 Other specified disorders of bone density and structure, multiple sites: Secondary | ICD-10-CM

## 2023-09-10 DIAGNOSIS — M8580 Other specified disorders of bone density and structure, unspecified site: Secondary | ICD-10-CM | POA: Insufficient documentation

## 2023-09-10 NOTE — Progress Notes (Signed)
   Subjective:   Patient ID: Raven Haynes, female    DOB: [birth year], 73 y.o.   MRN: [id number]  HPI The patient is a 73 YO female coming in for concerns about osteopenia and lipids and sugars.   Review of Systems  Constitutional: Negative.   HENT: Negative.    Eyes: Negative.   Respiratory:  Negative for cough, chest tightness and shortness of breath.   Cardiovascular:  Negative for chest pain, palpitations and leg swelling.  Gastrointestinal:  Negative for abdominal distention, abdominal pain, constipation, diarrhea, nausea and vomiting.  Musculoskeletal: Negative.   Skin: Negative.   Neurological: Negative.   Psychiatric/Behavioral: Negative.      Objective:  Physical Exam Constitutional:      Appearance: She is well-developed.  HENT:     Head: Normocephalic and atraumatic.   Cardiovascular:     Rate and Rhythm: Normal rate and regular rhythm.  Pulmonary:     Effort: Pulmonary effort is normal. No respiratory distress.     Breath sounds: Normal breath sounds. No wheezing or rales.  Abdominal:     General: Bowel sounds are normal. There is no distension.     Palpations: Abdomen is soft.     Tenderness: There is no abdominal tenderness. There is no rebound.   Musculoskeletal:     Cervical back: Normal range of motion.   Skin:    General: Skin is warm and dry.   Neurological:     Mental Status: She is alert and oriented to person, place, and time.     Coordination: Coordination normal.     Vitals:   09/10/23 0914  BP: (!) 140/88  Pulse: 71  Temp: 97.7 F (36.5 C)  TempSrc: Oral  SpO2: 98%  Weight: 162 lb (73.5 kg)  Height: 5' 2 (1.575 m)    Assessment & Plan:

## 2023-09-10 NOTE — Assessment & Plan Note (Signed)
 Due for labs in a month or so ordered today she will get when due.

## 2023-09-10 NOTE — Assessment & Plan Note (Signed)
 BP at goal at home borderline today due to not taking meds this morning. Continue hydrochlorothiazide  and losartan .

## 2023-09-10 NOTE — Assessment & Plan Note (Signed)
 Checking lipid panel in 1-2 months. Discussed diet and statins as well as decreasing risk for cv disease.

## 2023-09-10 NOTE — Assessment & Plan Note (Signed)
 Discussed recent DEXA with osteopenia and recommendation for repeat in 3-5 years. She had a scan done with ultrasound through insurance at home with discordant readings and we discussed that DEXA is gold standard and would be more accurate. No medication is indicated we discussed weight bearing exercise and calcium and vitamin D intake.

## 2023-10-16 DIAGNOSIS — H2513 Age-related nuclear cataract, bilateral: Secondary | ICD-10-CM | POA: Diagnosis not present

## 2023-10-16 DIAGNOSIS — H5203 Hypermetropia, bilateral: Secondary | ICD-10-CM | POA: Diagnosis not present

## 2023-10-16 DIAGNOSIS — H04123 Dry eye syndrome of bilateral lacrimal glands: Secondary | ICD-10-CM | POA: Diagnosis not present

## 2023-10-17 NOTE — Progress Notes (Signed)
 Office Visit Note  Patient: Raven Haynes             Date of Birth: April 17, 1950           MRN: [id number]             PCP: Rollene Almarie LABOR, MD Referring: Rollene Almarie LABOR, * Visit Date: 10/30/2023   Subjective:  Follow-up    Discussed the use of AI scribe software for clinical note transcription with the patient, who gave verbal consent to proceed.  History of Present Illness   Raven Haynes is a 73 y.o. female here for follow up seropositive RA currently off treatment.    She has a history of rheumatoid arthritis but is currently not on any medications as her joint inflammation has been well-controlled. She experiences some morning stiffness lasting about ten minutes, which resolves with movement.  She has a cyst on her finger joint. The cyst is not painful but affects her fingernail, causing a depression due to pressure from the cyst. Her husband had a similar condition and previously drained a cyst on her finger, resulting in a white substance being expelled and the cyst disappearing temporarily.  She recalls having a Baker's cyst in both knees, which was addressed during a previous surgery. Her knees pop and click, which she attributes to osteoarthritis.       Previous HPI 10/26/2022 Raven Haynes is a 73 y.o. female here for follow up for seropositive RA currently off treatment she discontinued Humira  after our last clinic visit.  Lab test at that time demonstrated very low positive ANA was negative for dsDNA.  She took 1 additional dose after that visit had a large local injection site reaction with itching and redness as well as flaring up the rashes all over.  Since stopping Humira  she initially took Tylenol  at first 3 times a day and then tapered off that completely.  At this time she is not on any more medicine and does not have any significant joint pain or stiffness.   Previous HPI 09/04/22 Raven Haynes is a 73 y.o. female here for follow up for seropositive RA on  Humira  40 mg subcu q. 14 days.  Joint inflammation is doing well has only brief morning stiffness daily. She does not see visible swelling in any joints. The left wrist cyst partially decreased in size although still present. She had a viral URI last month treated with cough medication and somewhat slow to resolve. Still has a persistent, nonproductive cough with some throat irritation but this was present before the cold. Has rashes after the past 4 Humira  injections getting somewhat worse over time. At first just an erythematous area around the injection site at the thigh. Now having rash with small raised bumps on the neck, behind ears, and on arms lasting for 4-5 days. Has tried taking benadryl and putting cold compress on affected site without resolution.   Previous HPI 01/08/22 Raven Haynes is a 73 y.o. female here for follow up for seropositive RA on leflunomide  20 mg daily. She is still having significant joint pain, stiffness, and swelling in hands and wrists and pain in knees. Left wrist has a new nodule as well that is swelling up and tender. Morning stiffness about 1-2 hours. She had one episode of severe diarrhea and abdominal pain after starting the medicine but otherwise fine.   Previous HPI   02/17/20 Raven Haynes is a 73 y.o. female here for follow of seropositive RA since starting methotrexate   about 6 weeks ago and on prednisone  10mg  daily. She was also seen in the interval by Dr. Hughie for right foot injection. She did not have any significant benefit. She was then recommended to have MRI for further assessment but had trouble getting this with insurance declined. She tolerated decreasing prednisone  to 5 mg daily without problems. Her hands feel fine but her foot pain is severely limiting weight bearing activities.    Review of Systems  Constitutional:  Negative for fatigue.  HENT:  Negative for mouth sores and mouth dryness.   Eyes:  Positive for dryness.  Respiratory:  Negative for  shortness of breath.   Cardiovascular:  Negative for chest pain and palpitations.  Gastrointestinal:  Negative for blood in stool, constipation and diarrhea.  Endocrine: Negative for increased urination.  Genitourinary:  Negative for involuntary urination.  Musculoskeletal:  Positive for morning stiffness and muscle tenderness. Negative for joint pain, gait problem, joint pain, joint swelling, myalgias, muscle weakness and myalgias.  Skin:  Positive for sensitivity to sunlight. Negative for color change, rash and hair loss.  Allergic/Immunologic: Negative for susceptible to infections.  Neurological:  Negative for dizziness and headaches.  Hematological:  Negative for swollen glands.  Psychiatric/Behavioral:  Negative for depressed mood and sleep disturbance. The patient is not nervous/anxious.     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   Hyperlipidemia associated with type 2 diabetes mellitus (HCC) 07/05/2023   Ganglion cyst of wrist, left 03/13/2022   Blurred vision, left eye 04/19/2021   Rheumatoid arthritis with rheumatoid factor of multiple sites without organ or systems involvement (HCC) 12/30/2019   Routine general medical examination at a health care facility 11/15/2015   Essential hypertension 03/22/2015    Past Medical History:  Diagnosis Date   Hypertension    Olecranon bursitis, right elbow 03/13/2022   Poison ivy dermatitis 07/19/2021   Rheumatoid arthritis (HCC)     Family History  Problem Relation Age of Onset   Diabetes Mother    Diabetes Sister    Breast cancer Neg Hx    Colon cancer Neg Hx    Colon polyps Neg Hx    Esophageal cancer Neg Hx    Rectal cancer Neg Hx    Stomach cancer Neg Hx    BRCA 1/2 Neg Hx    Past Surgical History:  Procedure Laterality Date   KNEE SURGERY Bilateral  2004,2009   Social History   Social History Narrative   Lives with husband   Immunization History  Administered Date(s) Administered   Fluad Quad(high Dose 65+) 12/19/2018, 12/04/2019   INFLUENZA, HIGH DOSE SEASONAL PF 12/10/2016, 12/13/2017   Influenza,inj,Quad PF,6+ Mos 11/15/2015   PFIZER(Purple Top)SARS-COV-2 Vaccination 03/31/2019, 04/18/2019   Pneumococcal Conjugate-13 03/22/2015   Pneumococcal Polysaccharide-23 03/10/2014   Tdap 09/09/2011   Zoster Recombinant(Shingrix) 10/04/2017   Zoster, Live 12/24/2013     Objective: Vital Signs: BP (!) 157/96 (BP Location: Left Arm, Patient Position: Sitting, Cuff Size: Normal)   Pulse 72   Resp 16   Ht 5' 2 (1.575 m)   Wt 166 lb 11.2 oz (75.6 kg)   BMI 30.49 kg/m    Physical Exam Eyes:     Conjunctiva/sclera: Conjunctivae normal.  Cardiovascular:     Rate and Rhythm: Normal rate and regular rhythm.  Pulmonary:     Effort:  Pulmonary effort is normal.     Breath sounds: Normal breath sounds.  Lymphadenopathy:     Cervical: No cervical adenopathy.  Skin:    General: Skin is warm and dry.  Neurological:     Mental Status: She is alert.  Psychiatric:        Mood and Affect: Mood normal.      Musculoskeletal Exam:  Shoulders full ROM no tenderness or swelling Elbows full ROM no tenderness or swelling Wrists full ROM no tenderness, left wrist small nontender cyst on radial side Fingers full ROM chronic MCP joint thickening on both hands, no tenderness or palpable effusions, left third DIP with digital mucinous cyst and associated fingernail deformity Knees full ROM patellofemoral crepitus worse on right knee, no tenderness or effusions Ankles full ROM no tenderness or swelling  Investigation: No additional findings.  Imaging: XR Hand 2 View Left Result Date: 10/31/2023 X-ray left hand 2 views Comparison film from December 30, 2019 at index assessment. Appears to be some overlapping of scaphoid and lunate positions  without clear dissociation or dislocation.  Calcification of TFCC is more clearly visible.  There are some mild narrowing at MCP joints.  Slight subluxation at first MCP.  There is some asymmetric PIP and DIP joint space narrowing.  This does appear slightly progressed compared to prior film.  No erosions or abnormal calcifications seen.  Bone mineralization may be consistent with generalized osteopenia. 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 Radiocarpal and carpal joint spaces appear normal.  There are some mild narrowing at MCP joints and periarticular cyst at radial border but second MCP.  Slight subluxation at first MCP.  There is some asymmetric PIP and DIP joint space narrowing.  This does appear slightly progressed compared to prior film.  No erosions or abnormal calcifications seen.  Bone mineralization may be consistent with generalized osteopenia. Impression No erosions, mild diffuse osteoarthritis some interval progression particularly in distal finger joints without evidence of erosive disease   Recent Labs: Lab Results  Component Value Date   WBC 3.8 (L) 04/08/2023   HGB 13.7 04/08/2023   PLT 266.0 04/08/2023   NA 137 04/08/2023   K 3.5 04/08/2023   CL 101 04/08/2023   CO2 26 04/08/2023   GLUCOSE 161 (H) 04/08/2023   BUN 16 04/08/2023   CREATININE 0.89 04/08/2023   BILITOT 0.6 04/08/2023   ALKPHOS 87 04/08/2023   AST 21 04/08/2023   ALT 18 04/08/2023   PROT 7.7 04/08/2023   ALBUMIN 4.8 04/08/2023   CALCIUM 9.8 04/08/2023   GFRAA 93 06/22/2020   QFTBGOLDPLUS NEGATIVE 03/13/2022    Speciality Comments: No specialty comments available.  Procedures:  Small Joint Inj: L long DIP on 10/30/2023 10:38  AM Indications: joint swelling Details: 22 G needle, dorsal approach Outcome: tolerated well, no immediate complications  Small volume of highly viscous, clear liquid expressed Procedure, treatment alternatives, risks and benefits explained, specific risks discussed. Consent was given by the patient. Immediately prior to procedure a time out was called to verify the correct patient, procedure, equipment, support staff and site/side marked as required. Patient was prepped and draped in the usual sterile fashion.     Allergies: Humira  (2 pen) [adalimumab ]   Assessment / Plan:     Visit  Diagnoses: Rheumatoid arthritis with rheumatoid factor of multiple sites without organ or systems involvement (HCC) - Plan: XR Hand 2 View Right, XR Hand 2 View Left, Sedimentation rate RA in remission with no joint inflammation or clinical signs of activity. No current RA medications. Antibodies persist in blood. - Order sedimentation rate blood test. - Order x-ray of hands.   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 Not painful but cosmetically concerning. Cysts can be drained with 50% recurrence risk. Surgical intervention by hand surgeon is definitive treatment. - Perform needle aspiration of the digital mucous cyst.   Orders: Orders Placed This Encounter  Procedures   Small Joint Inj: L long DIP   XR Hand 2 View Right   XR Hand 2 View Left   Sedimentation rate   No orders of the defined types were placed in this encounter.    Follow-Up Instructions: Return in about 1 year (around 10/29/2024), or if symptoms worsen or fail to improve, for RA/OA obs f/u 86yr.   Lonni LELON Ester, MD  Note - This record has been created using [employer].  Chart creation errors have been sought, but may not always  have been located. Such  creation errors do not reflect on  the standard of medical care.

## 2023-10-30 ENCOUNTER — Ambulatory Visit: Payer: Medicare HMO | Attending: Internal Medicine | Admitting: Internal Medicine

## 2023-10-30 ENCOUNTER — Ambulatory Visit

## 2023-10-30 ENCOUNTER — Encounter: Payer: Self-pay | Admitting: Internal Medicine

## 2023-10-30 VITALS — BP 157/96 | HR 72 | Resp 16 | Ht 62.0 in | Wt 166.7 lb

## 2023-10-30 DIAGNOSIS — M67442 Ganglion, left hand: Secondary | ICD-10-CM

## 2023-10-30 DIAGNOSIS — M0579 Rheumatoid arthritis with rheumatoid factor of multiple sites without organ or systems involvement: Secondary | ICD-10-CM

## 2023-10-31 ENCOUNTER — Ambulatory Visit: Payer: Self-pay | Admitting: Internal Medicine

## 2023-10-31 LAB — SEDIMENTATION RATE: Sed Rate: 11 mm/h (ref 0–30)

## 2023-10-31 NOTE — Progress Notes (Signed)
 Sedimentation rate is normal at 11.  Hand x-rays show some very slight progression of wear-and-tear arthritis changes in the fingers but no signs of rheumatoid arthritis damage.  I do not think she needs any medication changes at this time and we can follow-up as needed or if she has trouble with the finger cyst again.

## 2023-11-15 ENCOUNTER — Other Ambulatory Visit: Payer: Medicare HMO

## 2023-12-27 ENCOUNTER — Encounter: Payer: Self-pay | Admitting: *Deleted

## 2023-12-27 NOTE — Progress Notes (Signed)
 Emersynn Rahn                                          MRN: [id number]   12/27/2023   The [facility] Quality Team Specialist reviewed this patient medical record for the purposes of chart review for care gap closure. The following were reviewed: chart review for care gap closure-controlling blood pressure and kidney health evaluation for diabetes:eGFR  and uACR.    [facility] Quality Team

## 2024-01-13 ENCOUNTER — Encounter: Payer: Self-pay | Admitting: Radiology

## 2024-01-15 ENCOUNTER — Other Ambulatory Visit: Payer: Self-pay | Admitting: Internal Medicine

## 2024-03-06 NOTE — Progress Notes (Signed)
 Shaily Bergdoll                                          MRN: [id number]   03/06/2024   The [facility] Quality Team Specialist reviewed this patient medical record for the purposes of chart review for care gap closure. The following were reviewed: chart review for care gap closure-controlling blood pressure.    [facility] Quality Team

## 2024-03-13 NOTE — Progress Notes (Signed)
 Francenia Messineo                                          MRN: [id number]   03/13/2024   The [facility] Quality Team Specialist reviewed this patient medical record for the purposes of chart review for care gap closure. The following were reviewed: chart review for care gap closure-kidney health evaluation for diabetes:eGFR  and uACR.    [facility] Quality Team

## 2024-03-31 ENCOUNTER — Ambulatory Visit: Payer: Medicare HMO

## 2024-03-31 VITALS — BP 148/78 | HR 65 | Ht 62.0 in | Wt 165.4 lb

## 2024-03-31 DIAGNOSIS — Z0000 Encounter for general adult medical examination without abnormal findings: Secondary | ICD-10-CM | POA: Diagnosis not present

## 2024-03-31 NOTE — Patient Instructions (Addendum)
 [name],  Thank you for taking the time for your Medicare Wellness Visit. I appreciate your continued commitment to your health goals. Please review the care plan we discussed, and feel free to reach out if I can assist you further.  Please note that Annual Wellness Visits do not include a physical exam. Some assessments may be limited, especially if the visit was conducted virtually. If needed, we may recommend an in-person follow-up with your provider.  Ongoing Care Seeing your primary care provider every 3 to 6 months helps us  monitor your health and provide consistent, personalized care.   Referrals If a referral was made during today's visit and you haven't received any updates within two weeks, please contact the referred provider directly to check on the status.  Recommended Screenings:  Health Maintenance  Topic Date Due   Kidney health urinalysis for diabetes  Never done   Zoster (Shingles) Vaccine (2 of 2) 11/29/2017   Pneumococcal Vaccine for age over 41 (3 of 3 - PCV20 or PCV21) 03/21/2020   DTaP/Tdap/Td vaccine (2 - Td or Tdap) 09/08/2021   Flu Shot  10/11/2023   Hemoglobin A1C  01/04/2024   Yearly kidney function blood test for diabetes  04/07/2024   Complete foot exam   07/04/2024   Eye exam for diabetics  10/15/2024   Medicare Annual Wellness Visit  03/31/2025   Breast Cancer Screening  06/17/2025   Osteoporosis screening with Bone Density Scan  Completed   Hepatitis C Screening  Completed   Meningitis B Vaccine  Aged Out   Colon Cancer Screening  Discontinued   COVID-19 Vaccine  Discontinued       03/31/2024    8:46 AM  Advanced Directives  Does Patient Have a Medical Advance Directive? Yes  Type of Estate Agent of [place];Living will  Does patient want to make changes to medical advance directive? Yes (Inpatient - patient requests chaplain consult to change a medical advance directive)  Copy of Healthcare Power of Attorney in Chart?  No - copy requested    Vision: Annual vision screenings are recommended for early detection of glaucoma, cataracts, and diabetic retinopathy. These exams can also reveal signs of chronic conditions such as diabetes and high blood pressure.  Dental: Annual dental screenings help detect early signs of oral cancer, gum disease, and other conditions linked to overall health, including heart disease and diabetes.

## 2024-03-31 NOTE — Progress Notes (Signed)
 "  Chief Complaint  Patient presents with   Medicare Wellness     Subjective:   Raven Haynes is a 74 y.o. female who presents for a Medicare Annual Wellness Visit.  Visit info / Clinical Intake: Medicare Wellness Visit Type:: Subsequent Annual Wellness Visit Persons participating in visit and providing information:: patient Medicare Wellness Visit Mode:: In-person (required for WTM) Interpreter Needed?: No Pre-visit prep was completed: yes AWV questionnaire completed by patient prior to visit?: no Living arrangements:: lives with spouse/significant other Patient's Overall Health Status Rating: good Typical amount of pain: none Does pain affect daily life?: no Are you currently prescribed opioids?: (!) yes  Dietary Habits and Nutritional Risks How many meals a day?: 2 Eats fruit and vegetables daily?: yes Most meals are obtained by: preparing own meals; eating out In the last 2 weeks, have you had any of the following?: none Diabetic:: (!) yes Any non-healing wounds?: no How often do you check your BS?: 0 Would you like to be referred to a Nutritionist or for Diabetic Management? : no  Functional Status Activities of Daily Living (to include ambulation/medication): Independent Ambulation: Independent with device- listed below Home Assistive Devices/Equipment: Contact lenses; Eyeglasses Medication Administration: Independent Home Management (perform basic housework or laundry): Independent Manage your own finances?: yes Primary transportation is: driving Concerns about vision?: no *vision screening is required for WTM* Concerns about hearing?: no  Fall Screening Falls in the past year?: 0 Number of falls in past year: 0 Was there an injury with Fall?: 0 Fall Risk Category Calculator: 0 Patient Fall Risk Level: Low Fall Risk  Fall Risk Patient at Risk for Falls Due to: No Fall Risks Fall risk Follow up: Falls evaluation completed; Falls prevention discussed  Home and  Transportation Safety: All rugs have non-skid backing?: N/A, no rugs All stairs or steps have railings?: yes Grab bars in the bathtub or shower?: yes Have non-skid surface in bathtub or shower?: yes Good home lighting?: yes Regular seat belt use?: yes Hospital stays in the last year:: no  Cognitive Assessment Difficulty concentrating, remembering, or making decisions? : no Will 6CIT or Mini Cog be Completed: yes What year is it?: 0 points What month is it?: 0 points Give patient an address phrase to remember (5 components): [address] [place], [place] About what time is it?: 0 points Count backwards from 20 to 1: 0 points Say the months of the year in reverse: 0 points Repeat the address phrase from earlier: 4 points (Drive; [place]) 6 CIT Score: 4 points  Advance Directives (For Healthcare) Does Patient Have a Medical Advance Directive?: Yes Does patient want to make changes to medical advance directive?: Yes (Inpatient - patient requests chaplain consult to change a medical advance directive) Type of Advance Directive: Healthcare Power of [place]; Living will Copy of Healthcare Power of Attorney in Chart?: No - copy requested Copy of Living Will in Chart?: No - copy requested  Reviewed/Updated  Reviewed/Updated: Reviewed All (Medical, Surgical, Family, Medications, Allergies, Care Teams, Patient Goals)    Allergies (verified) Humira  (2 pen) [adalimumab ]   Current Medications (verified) Outpatient Encounter Medications as of 03/31/2024  Medication Sig   Calcium Citrate-Vitamin D (CITRACAL + D PO) Take 2 tablets by mouth daily. Total daily dose = Calcium 650mg  - Vitamin D3 - 1000 units-  Zn - 5.5mg    Cholecalciferol (D3-1000 PO) Take by mouth daily.   Cyanocobalamin (VITAMIN B 12 PO) Take 1,000 mcg by mouth daily.   Glucosamine-Chondroitin (MOVE FREE PO)  Take 1 tablet by mouth daily.   hydrochlorothiazide  (HYDRODIURIL ) 25 MG tablet TAKE ONE TABLET BY MOUTH ONE TIME DAILY    HYDROcodone -acetaminophen  (NORCO/VICODIN) 5-325 MG tablet Take 1 tablet by mouth 3 (three) times daily as needed.   losartan  (COZAAR ) 25 MG tablet TAKE ONE TABLET BY MOUTH ONCE A DAY   Magnesium 400 MG CAPS Take by mouth.   Multiple Vitamin (MULTIVITAMIN WITH MINERALS) TABS tablet Take 1 tablet by mouth daily.   TURMERIC PO Take 1,000 mg by mouth daily.   Zinc Sulfate (ZINC 15 PO) Take 50 mg by mouth daily.   Apple Cider Vinegar 600 MG CAPS Take 1 capsule by mouth daily. (Patient not taking: Reported on 03/31/2024)   B Complex-C (SUPER B COMPLEX PO) Take by mouth. (Patient not taking: Reported on 03/31/2024)   Red Yeast Rice Extract (RED YEAST RICE PO) Take 1 tablet by mouth daily. Total daily dose = 600 mg (Patient not taking: Reported on 03/31/2024)   No facility-administered encounter medications on file as of 03/31/2024.    History: Past Medical History:  Diagnosis Date   Hypertension    Olecranon bursitis, right elbow 03/13/2022   Poison ivy dermatitis 07/19/2021   Rheumatoid arthritis (HCC)    Past Surgical History:  Procedure Laterality Date   KNEE SURGERY Bilateral 2004,2009   Family History  Problem Relation Age of Onset   Diabetes Mother    Diabetes Sister    Breast cancer Neg Hx    Colon cancer Neg Hx    Colon polyps Neg Hx    Esophageal cancer Neg Hx    Rectal cancer Neg Hx    Stomach cancer Neg Hx    BRCA 1/2 Neg Hx    Social History   Occupational History   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    Employer: 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  Tobacco Use   Smoking status: Never    Passive exposure: Never   Smokeless tobacco: Never  Vaping Use   Vaping status: Never Used  Substance and Sexual Activity   Alcohol use: Yes    Alcohol/week: 2.0 standard drinks of alcohol    Types: 2 Glasses of wine per week    Comment: occ glass of wine   Drug use: No   Sexual activity: Yes   Tobacco Counseling Counseling given: No  SDOH Screenings   Food  Insecurity: No Food Insecurity (03/31/2024)  Housing: Unknown (03/31/2024)  Transportation Needs: No Transportation Needs (03/31/2024)  Utilities: Not At Risk (03/31/2024)  Alcohol Screen: Low Risk (03/29/2023)  Depression (PHQ2-9): Low Risk (03/31/2024)  Financial Resource Strain: Low Risk (03/29/2023)  Physical Activity: Insufficiently Active (03/31/2024)  Social Connections: Moderately Integrated (03/31/2024)  Stress: No Stress Concern Present (03/31/2024)  Tobacco Use: Low Risk (03/31/2024)  Health Literacy: Adequate Health Literacy (03/31/2024)   See flowsheets for full screening details  Depression Screen PHQ 2 & 9 Depression Scale- Over the past 2 weeks, how often have you been bothered by any of the following problems? Little interest or pleasure in doing things: 0 Feeling down, depressed, or hopeless (PHQ Adolescent also includes...irritable): 0 PHQ-2 Total Score: 0 Trouble falling or staying asleep, or sleeping too much: 0 Feeling tired or having little energy: 0 Poor appetite or overeating (PHQ Adolescent also includes...weight loss): 0 Feeling bad about yourself - or that you are a failure or have let yourself or your family down: 0 Trouble concentrating on things, such as reading the newspaper or watching television (PHQ Adolescent also includes...like school  work): 0 Moving or speaking so slowly that other people could have noticed. Or the opposite - being so fidgety or restless that you have been moving around a lot more than usual: 0 Thoughts that you would be better off dead, or of hurting yourself in some way: 0 PHQ-9 Total Score: 0 If you checked off any problems, how difficult have these problems made it for you to do your work, take care of things at home, or get along with other people?: Not difficult at all  Depression Treatment Depression Interventions/Treatment : EYV7-0 Score <4 Follow-up Not Indicated     Goals Addressed               This Visit's Progress      Patient Stated (pt-stated)        Patient stated she plans to continue watching diet/managing sugar intake and bp readings             Objective:    Today's Vitals   03/31/24 0845  BP: (!) 148/78  Pulse: 65  SpO2: 95%  Weight: 165 lb 6.4 oz (75 kg)  Height: 5' 2 (1.575 m)   Body mass index is 30.25 kg/m.  Hearing/Vision screen Hearing Screening - Comments:: Denies hearing difficulties   Vision Screening - Comments:: Wears contact lenses and eyeglasses- up to date with routine eye exams with Arlyss King Immunizations and Health Maintenance Health Maintenance  Topic Date Due   Diabetic kidney evaluation - Urine ACR  Never done   Zoster Vaccines- Shingrix (2 of 2) 11/29/2017   Pneumococcal Vaccine: 50+ Years (3 of 3 - PCV20 or PCV21) 03/21/2020   DTaP/Tdap/Td (2 - Td or Tdap) 09/08/2021   Influenza Vaccine  10/11/2023   HEMOGLOBIN A1C  01/04/2024   Diabetic kidney evaluation - eGFR measurement  04/07/2024   FOOT EXAM  07/04/2024   OPHTHALMOLOGY EXAM  10/15/2024   Medicare Annual Wellness (AWV)  03/31/2025   Mammogram  06/17/2025   Bone Density Scan  Completed   Hepatitis C Screening  Completed   Meningococcal B Vaccine  Aged Out   Colonoscopy  Discontinued   COVID-19 Vaccine  Discontinued        Assessment/Plan:  This is a routine wellness examination for Yarelin.  I have recommended that this patient have a immunization for Shingles, Influenza, Pneumonia, and Tdap but she declines at this time. I have discussed the risks and benefits of this procedure with her. The patient verbalizes understanding.   Patient Care Team: Rollene Almarie LABOR, MD as PCP - General (Internal Medicine) Szabat, Toribio BROCKS, [facility] (Inactive) as Pharmacist (Pharmacist) King Arlyss, MD as Consulting Physician (Ophthalmology)  I have personally reviewed and noted the following in the patients chart:   Medical and social history Use of alcohol, tobacco or illicit drugs  Current medications  and supplements including opioid prescriptions. Functional ability and status Nutritional status Physical activity Advanced directives List of other physicians Hospitalizations, surgeries, and ER visits in previous 12 months Vitals Screenings to include cognitive, depression, and falls Referrals and appointments  No orders of the defined types were placed in this encounter.  In addition, I have reviewed and discussed with patient certain preventive protocols, quality metrics, and best practice recommendations. A written personalized care plan for preventive services as well as general preventive health recommendations were provided to patient.   Verdie CHRISTELLA Saba, CMA   03/31/2024   Return in 1 year (on 03/31/2025).  After Visit Summary: (In Person-Declined) Patient declined AVS  at this time.  Nurse Notes: scheduled 2027 AWV/CPE appts "

## 2024-04-07 NOTE — Progress Notes (Signed)
 Raven Haynes                                          MRN: [id number]   04/07/2024   The [facility] Quality Team Specialist reviewed this patient medical record for the purposes of chart review for care gap closure. The following were reviewed: chart review for care gap closure-controlling blood pressure and kidney health evaluation for diabetes:eGFR  and uACR.    [facility] Quality Team

## 2024-04-13 ENCOUNTER — Encounter: Admitting: Internal Medicine

## 2024-04-20 ENCOUNTER — Encounter: Admitting: Internal Medicine

## 2025-04-16 ENCOUNTER — Ambulatory Visit

## 2025-04-16 ENCOUNTER — Encounter: Admitting: Internal Medicine
